# Patient Record
Sex: Female | Born: 1945 | Race: White | Hispanic: No | Smoking: Never smoker
Health system: Southern US, Community
[De-identification: ages and names within clinical notes are randomized; demographics above are authoritative.]

## PROBLEM LIST (undated history)

## (undated) ENCOUNTER — Ambulatory Visit

## (undated) DIAGNOSIS — G709 Myoneural disorder, unspecified: Secondary | ICD-10-CM

## (undated) DIAGNOSIS — I639 Cerebral infarction, unspecified: Secondary | ICD-10-CM

## (undated) DIAGNOSIS — I50810 Right heart failure, unspecified: Secondary | ICD-10-CM

## (undated) DIAGNOSIS — E785 Hyperlipidemia, unspecified: Secondary | ICD-10-CM

## (undated) DIAGNOSIS — Z86718 Personal history of other venous thrombosis and embolism: Secondary | ICD-10-CM

## (undated) DIAGNOSIS — R2689 Other abnormalities of gait and mobility: Secondary | ICD-10-CM

## (undated) DIAGNOSIS — Z8489 Family history of other specified conditions: Secondary | ICD-10-CM

## (undated) DIAGNOSIS — H269 Unspecified cataract: Secondary | ICD-10-CM

## (undated) DIAGNOSIS — G4730 Sleep apnea, unspecified: Secondary | ICD-10-CM

## (undated) DIAGNOSIS — M1990 Unspecified osteoarthritis, unspecified site: Secondary | ICD-10-CM

## (undated) DIAGNOSIS — F419 Anxiety disorder, unspecified: Secondary | ICD-10-CM

## (undated) DIAGNOSIS — J189 Pneumonia, unspecified organism: Secondary | ICD-10-CM

## (undated) DIAGNOSIS — Z87442 Personal history of urinary calculi: Secondary | ICD-10-CM

## (undated) DIAGNOSIS — I509 Heart failure, unspecified: Secondary | ICD-10-CM

## (undated) DIAGNOSIS — F329 Major depressive disorder, single episode, unspecified: Secondary | ICD-10-CM

## (undated) DIAGNOSIS — G629 Polyneuropathy, unspecified: Secondary | ICD-10-CM

## (undated) DIAGNOSIS — R609 Edema, unspecified: Secondary | ICD-10-CM

## (undated) DIAGNOSIS — N393 Stress incontinence (female) (male): Secondary | ICD-10-CM

## (undated) DIAGNOSIS — G8929 Other chronic pain: Secondary | ICD-10-CM

## (undated) DIAGNOSIS — K219 Gastro-esophageal reflux disease without esophagitis: Secondary | ICD-10-CM

## (undated) DIAGNOSIS — F32A Depression, unspecified: Secondary | ICD-10-CM

## (undated) DIAGNOSIS — T7840XA Allergy, unspecified, initial encounter: Secondary | ICD-10-CM

## (undated) DIAGNOSIS — R51 Headache: Secondary | ICD-10-CM

## (undated) DIAGNOSIS — E669 Obesity, unspecified: Secondary | ICD-10-CM

## (undated) DIAGNOSIS — R519 Headache, unspecified: Secondary | ICD-10-CM

## (undated) DIAGNOSIS — I10 Essential (primary) hypertension: Secondary | ICD-10-CM

## (undated) HISTORY — DX: Right heart failure, unspecified: I50.810

## (undated) HISTORY — DX: Essential (primary) hypertension: I10

## (undated) HISTORY — DX: Unspecified cataract: H26.9

## (undated) HISTORY — DX: Sleep apnea, unspecified: G47.30

## (undated) HISTORY — DX: Allergy, unspecified, initial encounter: T78.40XA

## (undated) HISTORY — PX: FRACTURE SURGERY: SHX138

## (undated) HISTORY — PX: TUBAL LIGATION: SHX77

## (undated) HISTORY — DX: Unspecified osteoarthritis, unspecified site: M19.90

## (undated) HISTORY — PX: OTHER SURGICAL HISTORY: SHX169

## (undated) HISTORY — DX: Personal history of other venous thrombosis and embolism: Z86.718

## (undated) HISTORY — DX: Other chronic pain: G89.29

## (undated) HISTORY — DX: Headache: R51

## (undated) HISTORY — DX: Headache, unspecified: R51.9

## (undated) HISTORY — DX: Obesity, unspecified: E66.9

## (undated) HISTORY — PX: ABDOMINAL HYSTERECTOMY: SHX81

## (undated) HISTORY — DX: Heart failure, unspecified: I50.9

## (undated) HISTORY — DX: Depression, unspecified: F32.A

## (undated) HISTORY — DX: Polyneuropathy, unspecified: G62.9

## (undated) HISTORY — DX: Myoneural disorder, unspecified: G70.9

## (undated) HISTORY — DX: Anxiety disorder, unspecified: F41.9

## (undated) HISTORY — DX: Hyperlipidemia, unspecified: E78.5

## (undated) HISTORY — DX: Other abnormalities of gait and mobility: R26.89

## (undated) HISTORY — DX: Gastro-esophageal reflux disease without esophagitis: K21.9

## (undated) HISTORY — DX: Major depressive disorder, single episode, unspecified: F32.9

---

## 2007-09-26 DIAGNOSIS — I2692 Saddle embolus of pulmonary artery without acute cor pulmonale: Secondary | ICD-10-CM

## 2007-09-26 HISTORY — DX: Saddle embolus of pulmonary artery without acute cor pulmonale: I26.92

## 2007-11-15 ENCOUNTER — Ambulatory Visit: Payer: Self-pay | Admitting: Family Medicine

## 2007-11-15 ENCOUNTER — Encounter: Admission: RE | Admit: 2007-11-15 | Discharge: 2007-11-15 | Payer: Self-pay | Admitting: Family Medicine

## 2007-11-20 ENCOUNTER — Ambulatory Visit: Payer: Self-pay | Admitting: Family Medicine

## 2007-12-25 ENCOUNTER — Ambulatory Visit: Payer: Self-pay | Admitting: *Deleted

## 2007-12-25 ENCOUNTER — Encounter (INDEPENDENT_AMBULATORY_CARE_PROVIDER_SITE_OTHER): Payer: Self-pay | Admitting: Internal Medicine

## 2007-12-25 ENCOUNTER — Ambulatory Visit: Payer: Self-pay | Admitting: Cardiology

## 2007-12-25 ENCOUNTER — Inpatient Hospital Stay (HOSPITAL_COMMUNITY): Admission: EM | Admit: 2007-12-25 | Discharge: 2007-12-30 | Payer: Self-pay | Admitting: Emergency Medicine

## 2007-12-26 ENCOUNTER — Encounter: Payer: Self-pay | Admitting: Vascular Surgery

## 2007-12-26 ENCOUNTER — Ambulatory Visit: Payer: Self-pay | Admitting: Vascular Surgery

## 2008-01-09 ENCOUNTER — Encounter (INDEPENDENT_AMBULATORY_CARE_PROVIDER_SITE_OTHER): Payer: Self-pay | Admitting: *Deleted

## 2008-01-09 ENCOUNTER — Inpatient Hospital Stay (HOSPITAL_COMMUNITY): Admission: EM | Admit: 2008-01-09 | Discharge: 2008-01-10 | Payer: Self-pay | Admitting: Emergency Medicine

## 2008-06-09 IMAGING — CR DG CHEST 2V
2 series · 2 of 2 positions shown · non-contrast
Comparison: None.

CLINICAL DATA: Shortness of breath.  
 CHEST ? 2 VIEW:

[view not recorded (1 of 2)]
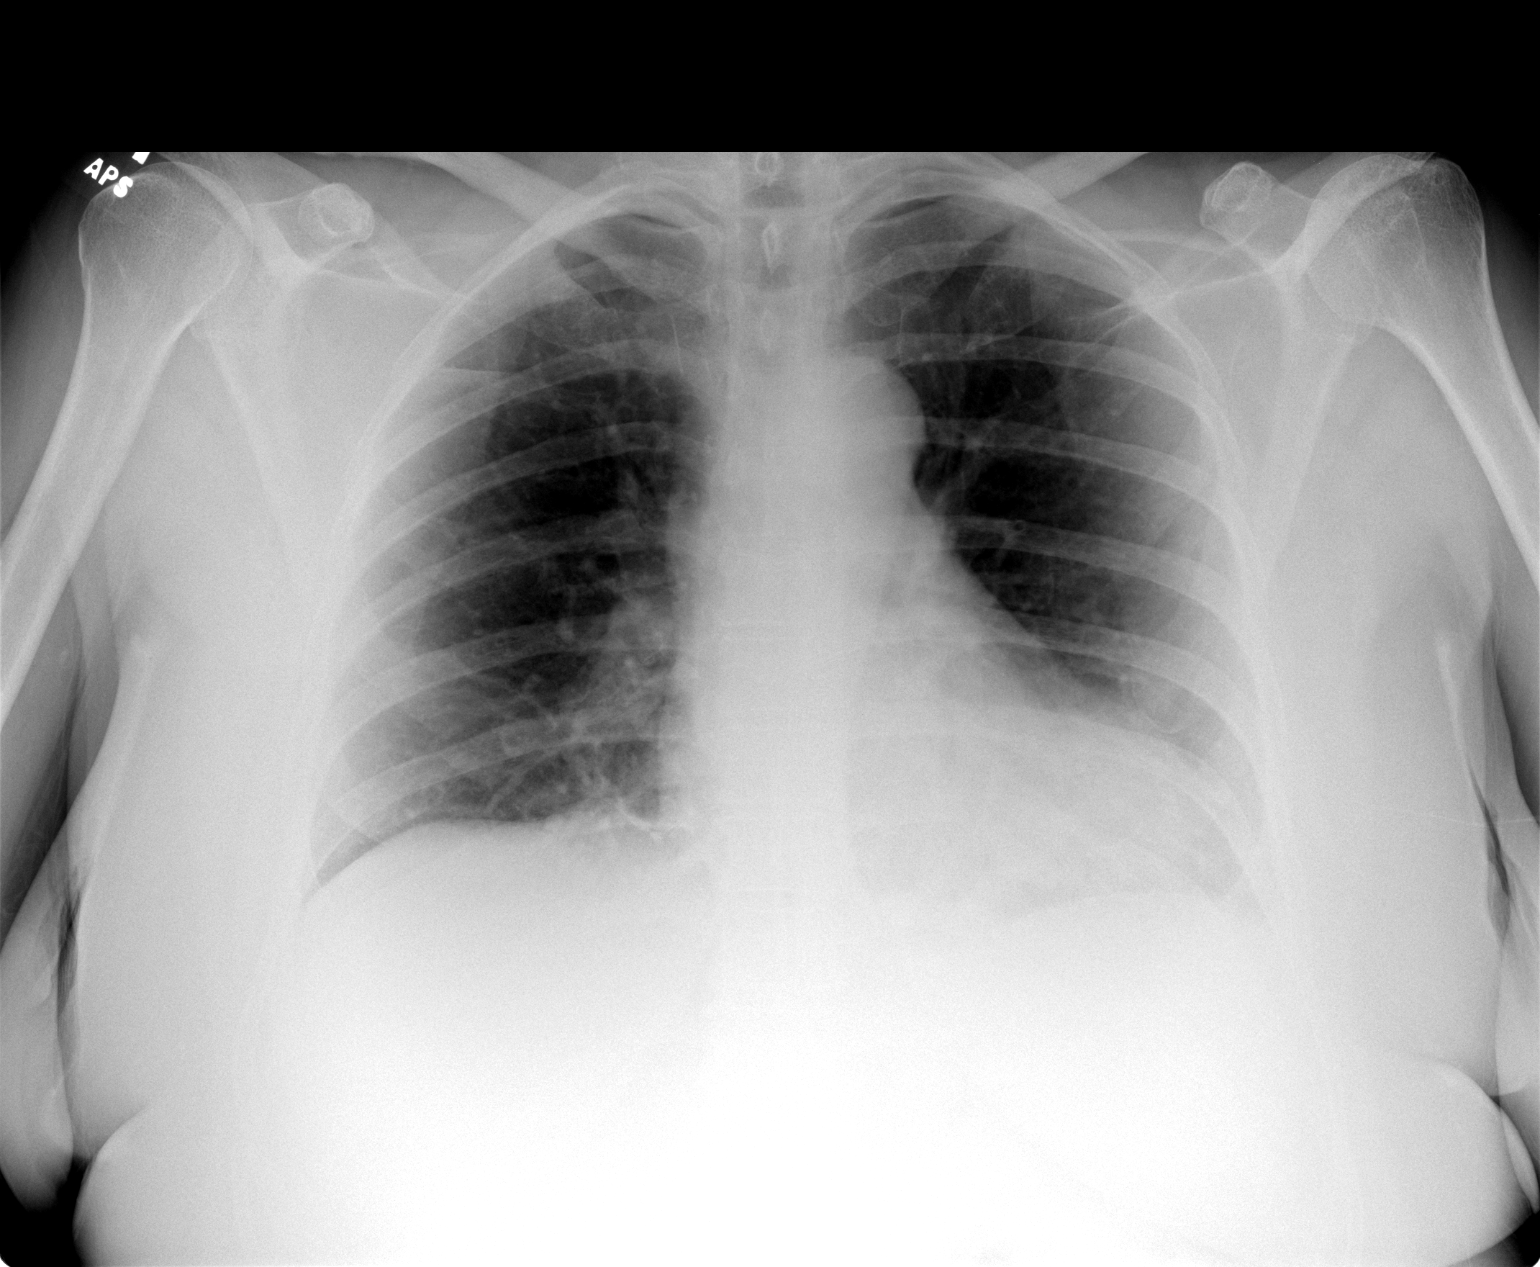

[view not recorded (2 of 2)]
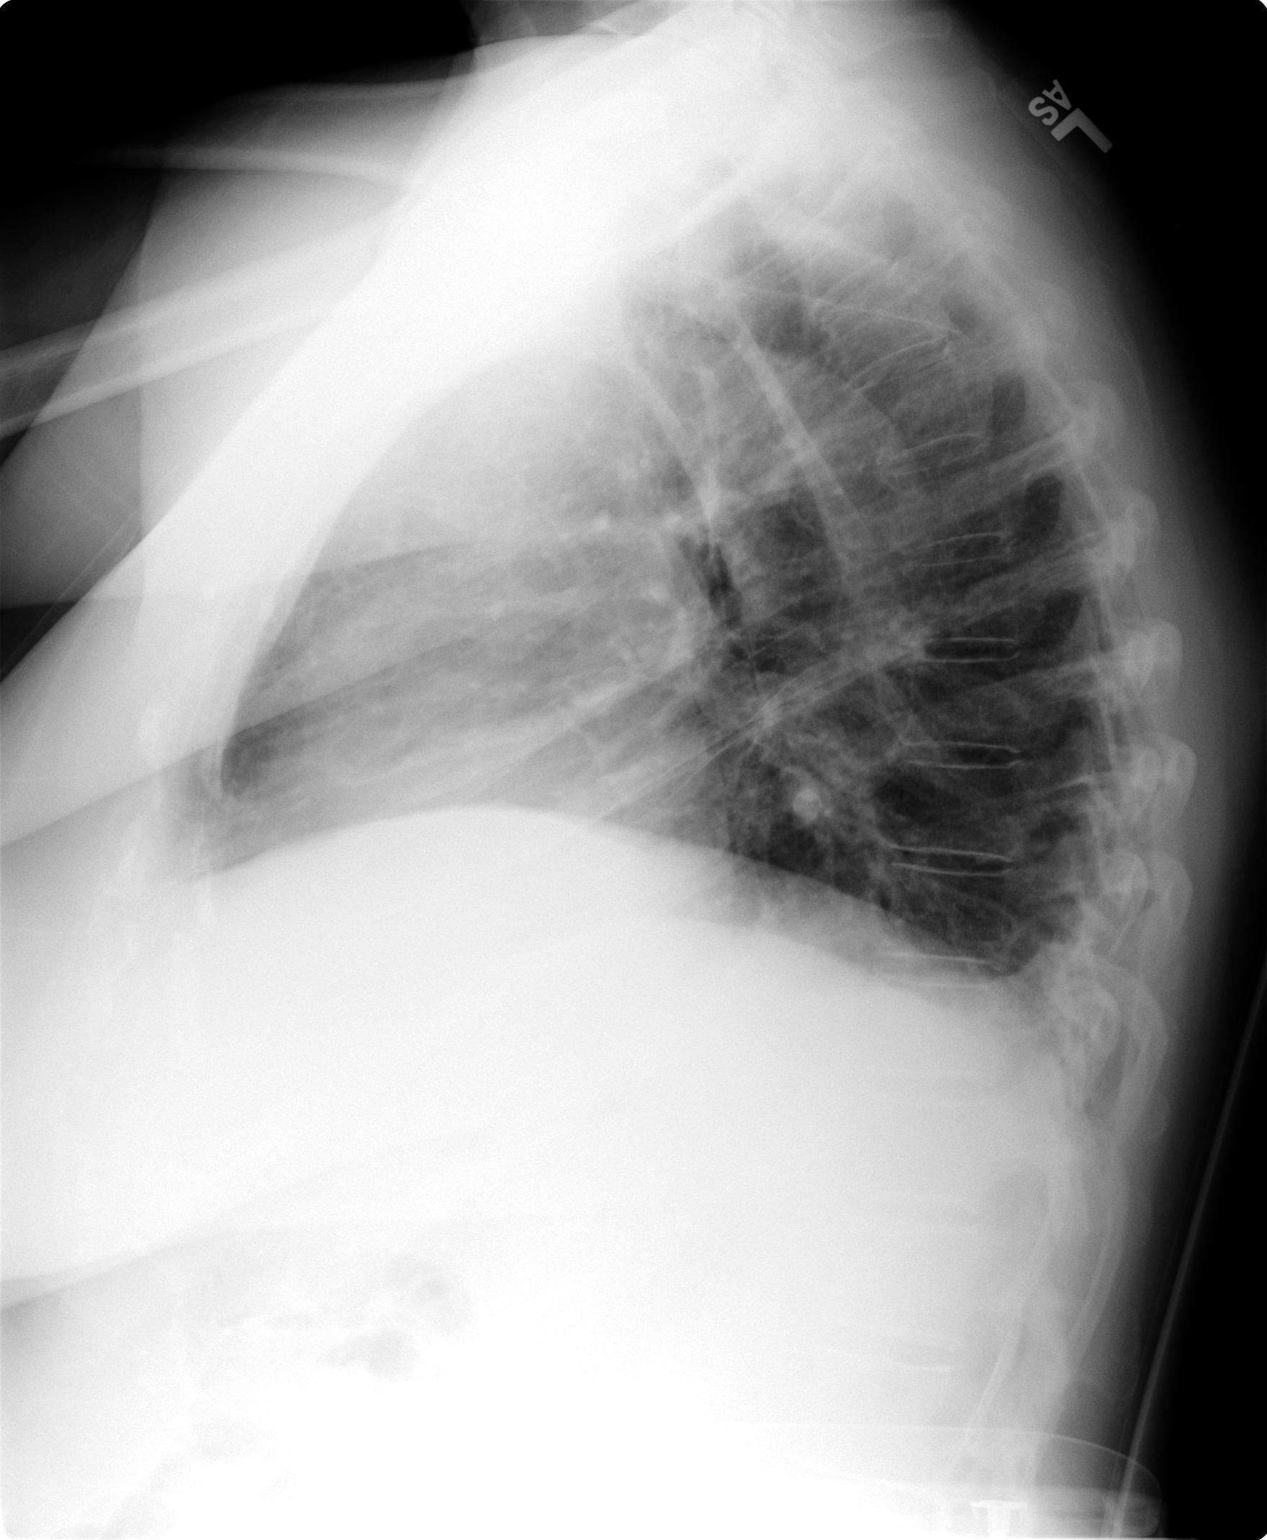

[2 of 2 positions shown; findings below may reference images not displayed]

FINDINGS: Patient has a small left pleural effusion with a tiny left apical pneumothorax.  Right lung is clear.  Heart size normal.
IMPRESSION: Small left pleural effusion with tiny left apical pneumothorax.

## 2008-07-19 IMAGING — CT CT ANGIO CHEST
2 of 5 series · 19 of 36 positions shown · IV contrast (agent unspecified)
Comparison: Chest x-ray 11/15/2007.

CLINICAL DATA: Shortness of breath with upper back pain radiating
to right shoulder.  Surgery October 2007.

CT ANGIOGRAPHY CHEST
TECHNIQUE: Multidetector CT imaging of the chest using the
standard protocol during bolus administration of intravenous
contrast. Multiplanar reconstructed images obtained and reviewed to
evaluate the vascular anatomy.
Contrast: 70 ml of Hmnipaque-KRR.

[Series 8: pulm embolism 1.0 b25f thins · axial · 0.65mm/px · z∈[-254,-16]mm · 16 of 267 slices shown]
[im 14/267  lung]
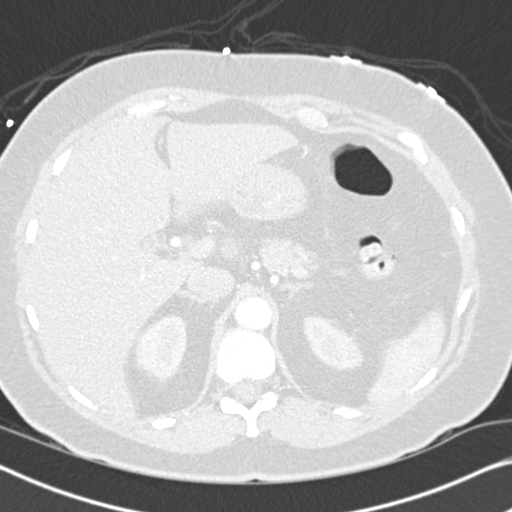
[im 27/267  mediastinal]
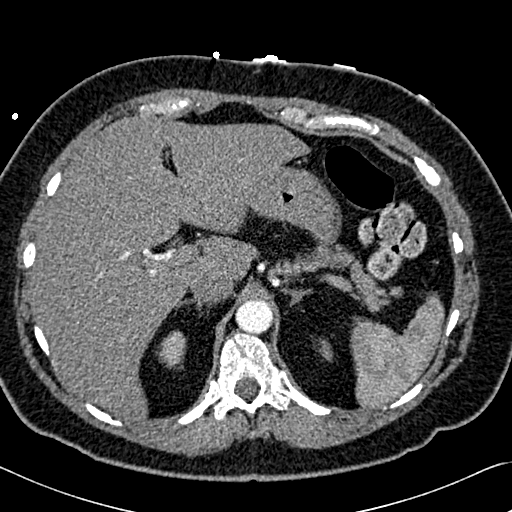
[im 40/267  lung]
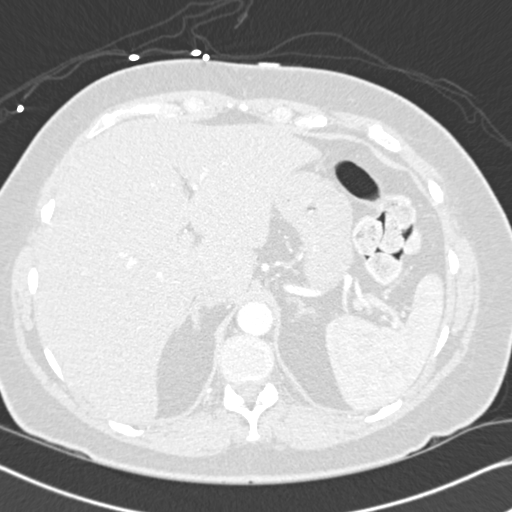
[im 67/267  mediastinal]
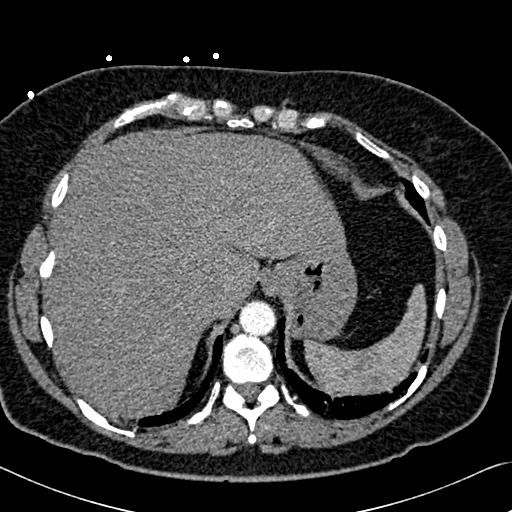
[im 80/267  lung]
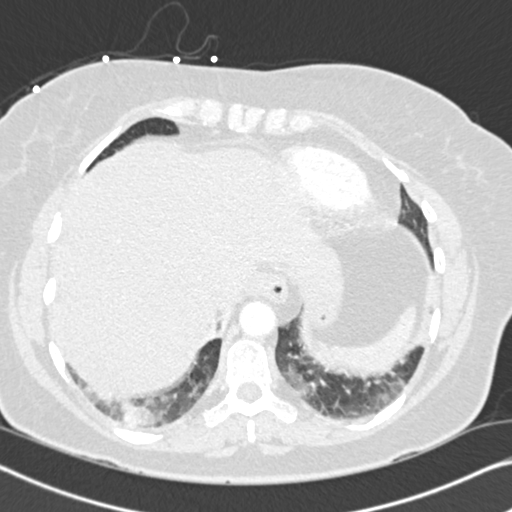
[im 94/267  mediastinal]
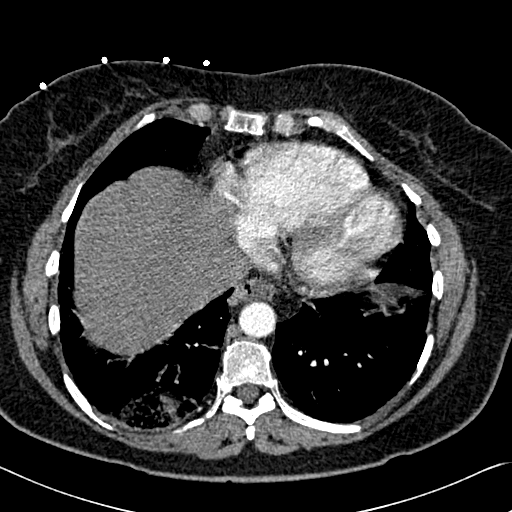
[im 107/267  lung]
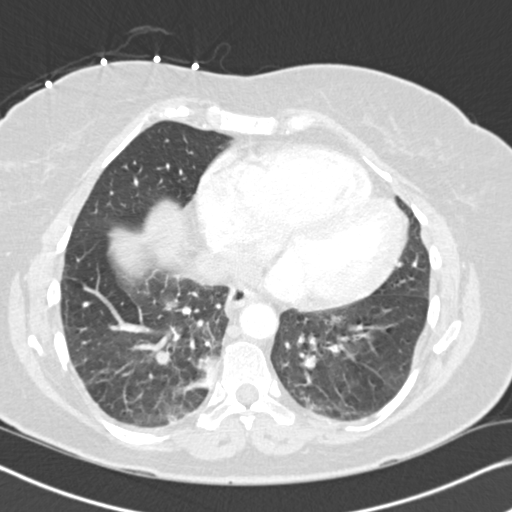
[im 120/267  mediastinal]
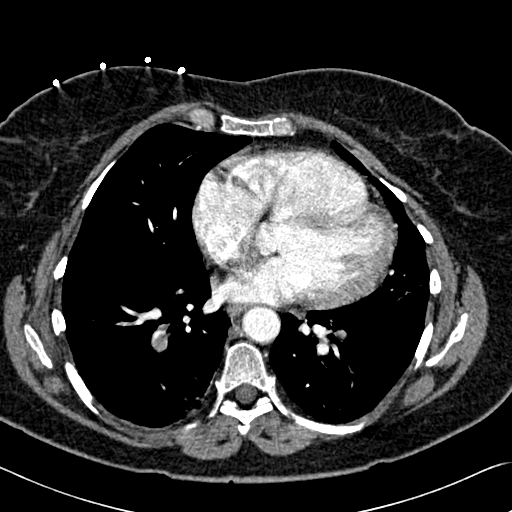
[im 147/267  lung]
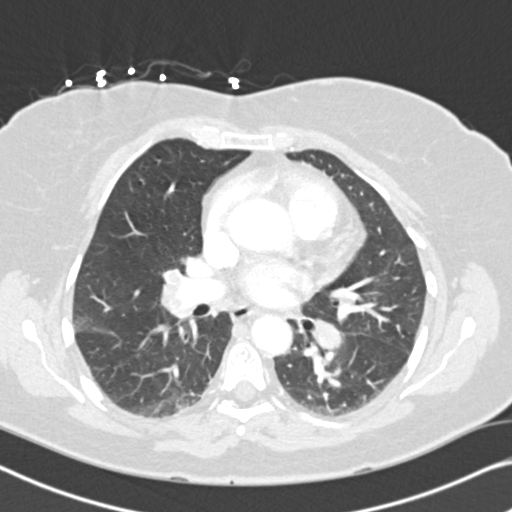
[im 160/267  mediastinal]
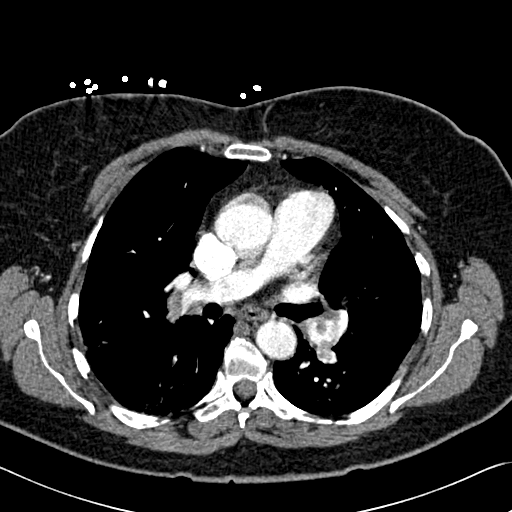
[im 173/267  lung]
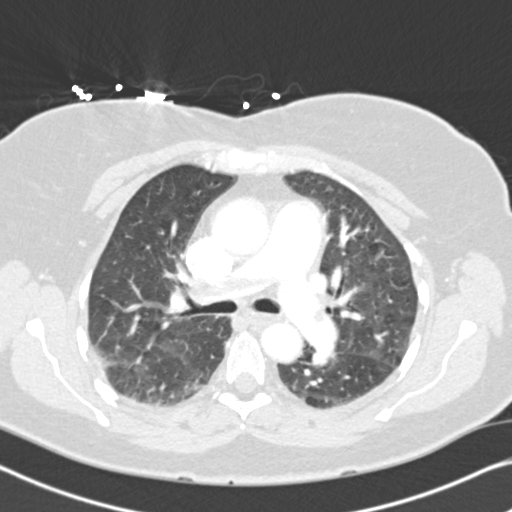
[im 187/267  mediastinal]
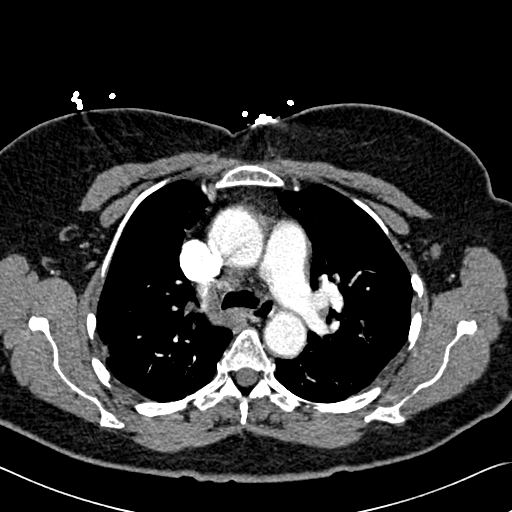
[im 200/267  lung]
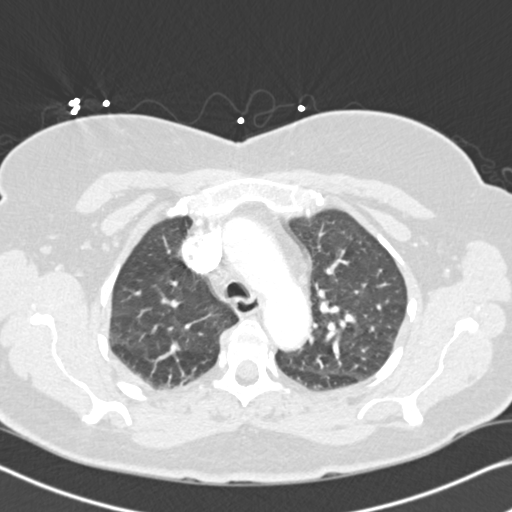
[im 227/267  mediastinal]
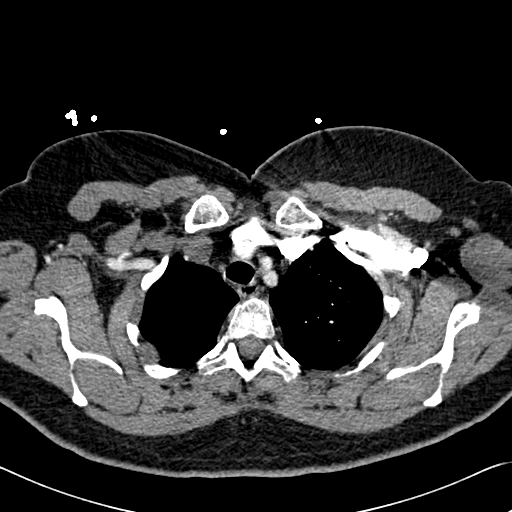
[im 240/267  lung]
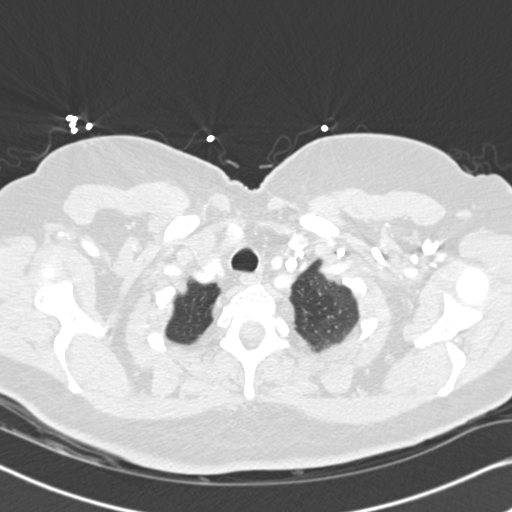
[im 253/267  mediastinal]
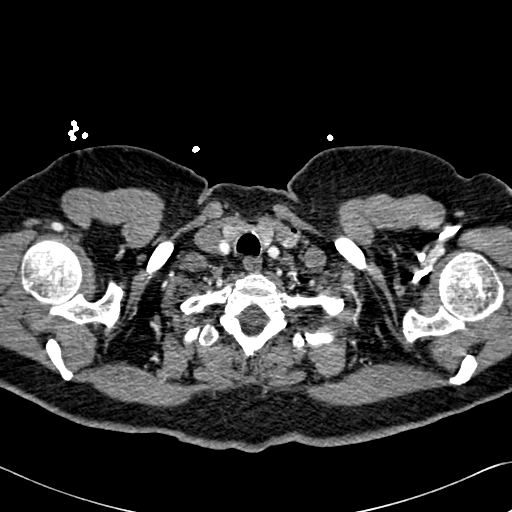

[Series 9: pulm embolism 2.0 spo cor thins · coronal · 0.65mm/px · 3 of 111 slices shown]
[im 23/111  mediastinal]
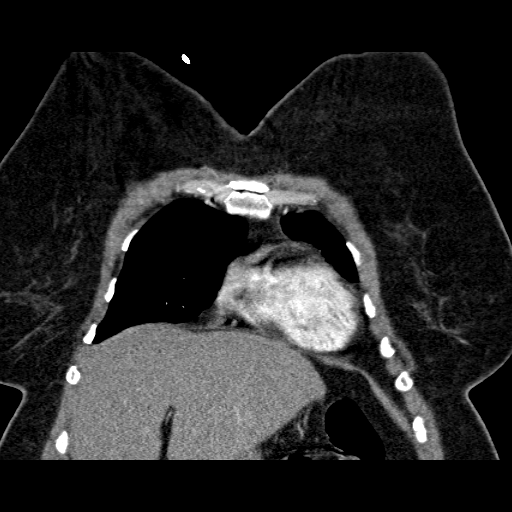
[im 45/111  mediastinal]
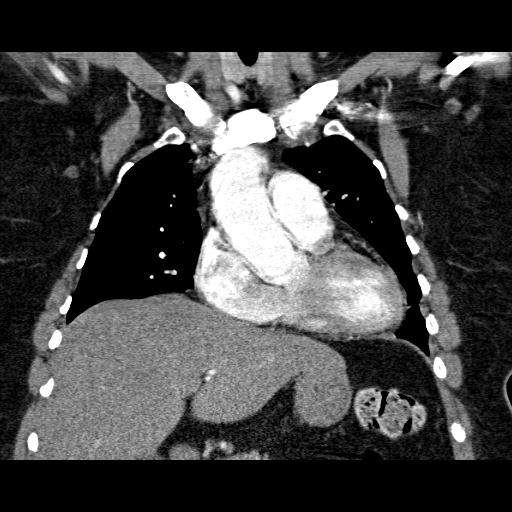
[im 67/111  mediastinal]
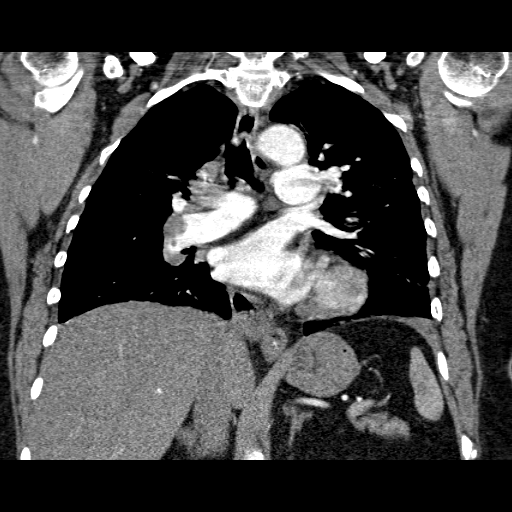

[19 of 36 positions shown; findings below may reference images not displayed]

FINDINGS: There is a large saddle embolus at the bifurcation of the
main pulmonary arteries, with clot extending into the lobar,
segmental and subsegmental pulmonary arteries bilaterally.
Pulmonary arteries are enlarged and there is straightening of the
interventricular septum.  Heart is enlarged.  Mediastinal lymph
nodes are small to borderline enlarged, measuring up to 1.1 cm
anterior to the right mainstem bronchus. Esophagus is minimally
dilated throughout its course and there is a small hiatal hernia.

Subpleural ground-glass is seen in the right lower lobe.  There are
other scattered foci of ground-glass attenuation.  Linear densities
are seen scattered as well, consist with atelectasis.  No pleural
fluid.  Airway unremarkable.  Atelectasis or scar along the left
hemidiaphragm.

Incidental imaging of the upper abdomen shows no acute findings.
No worrisome lytic or sclerotic lesions.
IMPRESSION: 1.  Large saddle pulmonary embolus with extension into the distal
pulmonary arteries bilaterally, as above.  Right heart strain and
pulmonary arterial hypertension.  Right lower lobe infarct.
Probable scattered areas of early infarction  elsewhere. This was
discussed with Dr. Rudi, in [REDACTED], at [DATE] a.m. on 12/25/2007.

## 2008-07-20 IMAGING — CR DG CHEST 1V PORT
1 series · 1 of 1 positions shown · non-contrast
Comparison: Chest x-ray of 11/15/2007 and CT chest of 12/25/2007.

CLINICAL DATA: History of pulmonary embolism and hypertension,
shortness of breath

PORTABLE CHEST - 1 VIEW

[AP]
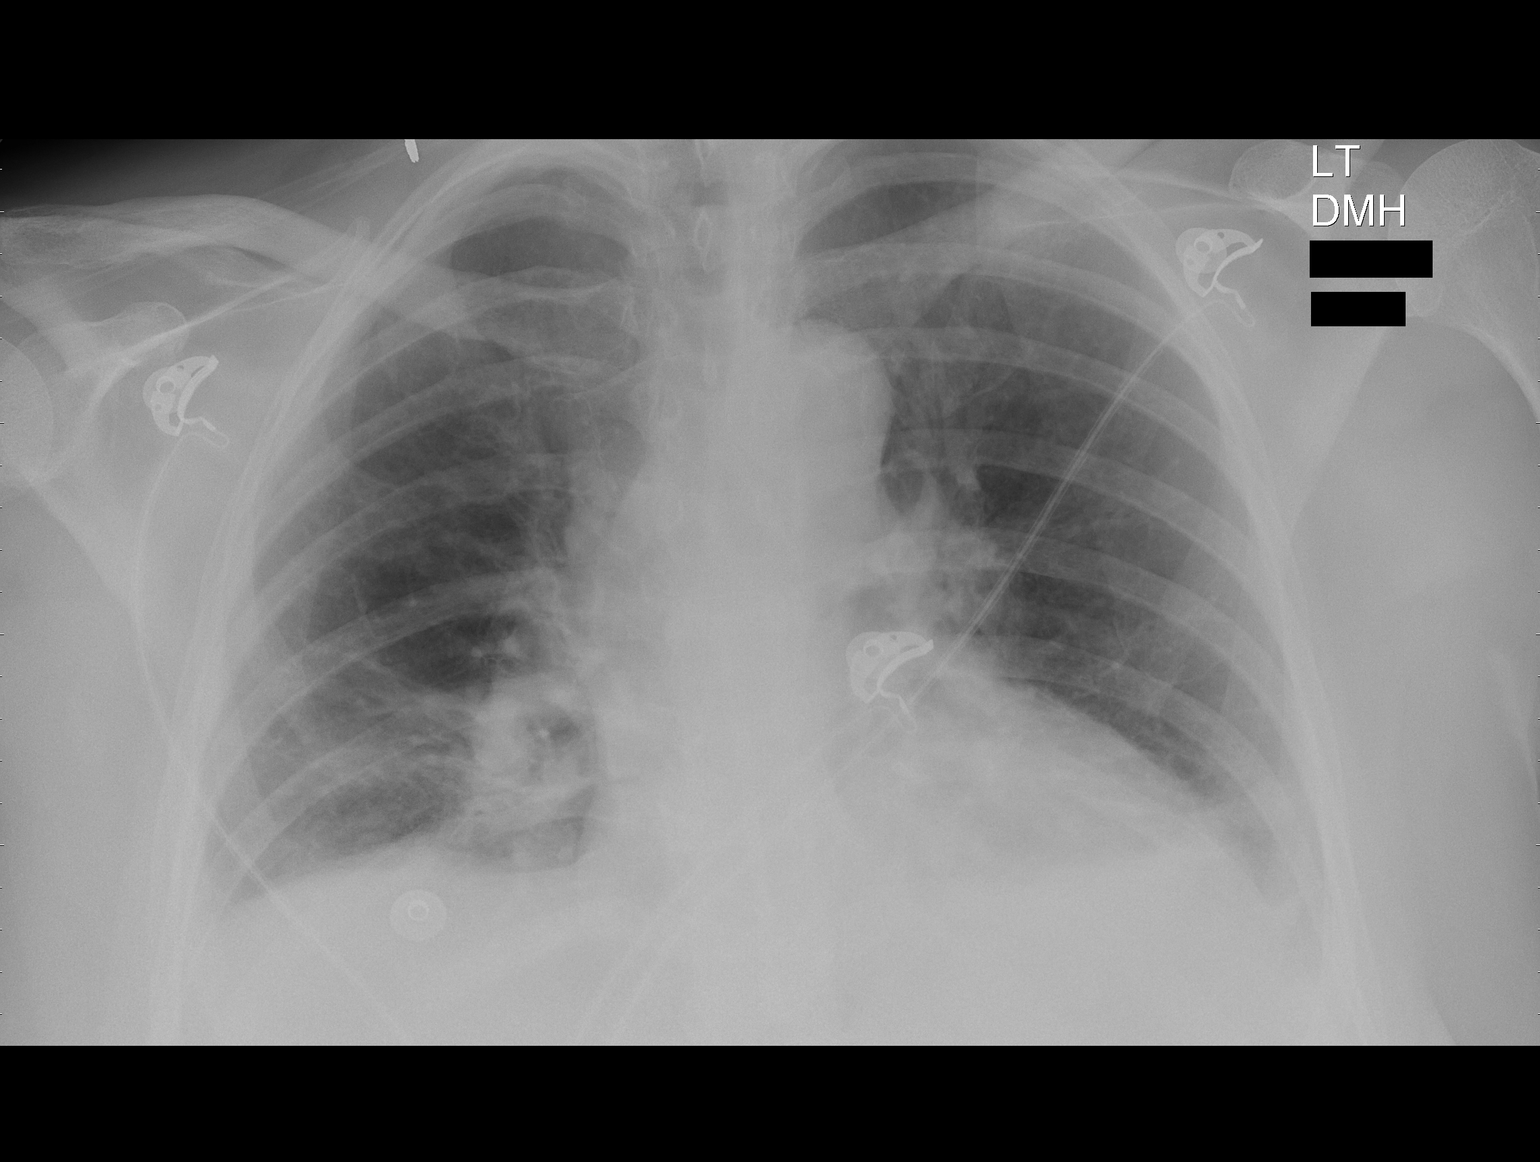

[1 of 1 positions shown; findings below may reference images not displayed]

FINDINGS: There are linear opacities at both lung bases consistent
with atelectasis.  In view of the large emboli noted on yesterday's
CT of the chest, early infarction cannot be excluded.  Mild
cardiomegaly is present.
IMPRESSION: Linear basilar opacities bilaterally most consistent with
atelectasis.  Cannot exclude early infarction in view of the large
emboli noted on yesterday's CT of the chest.

## 2008-07-22 IMAGING — CR DG FEMUR 2+V*R*
4 series · 4 of 4 positions shown · non-contrast
Comparison: None.

CLINICAL DATA: Status post fracture fixation.  Unable to bear
weight.

RIGHT FEMUR - 2 VIEW

[t femur with knee ap right]
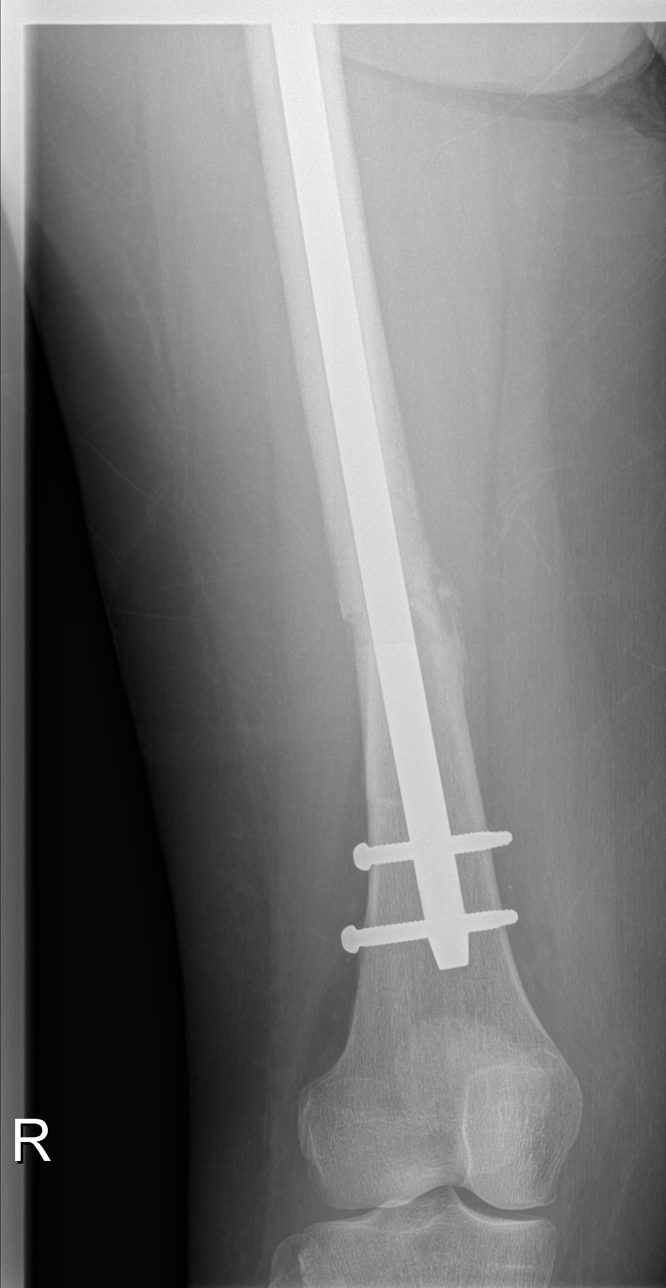

[t femur with hip  ap right]
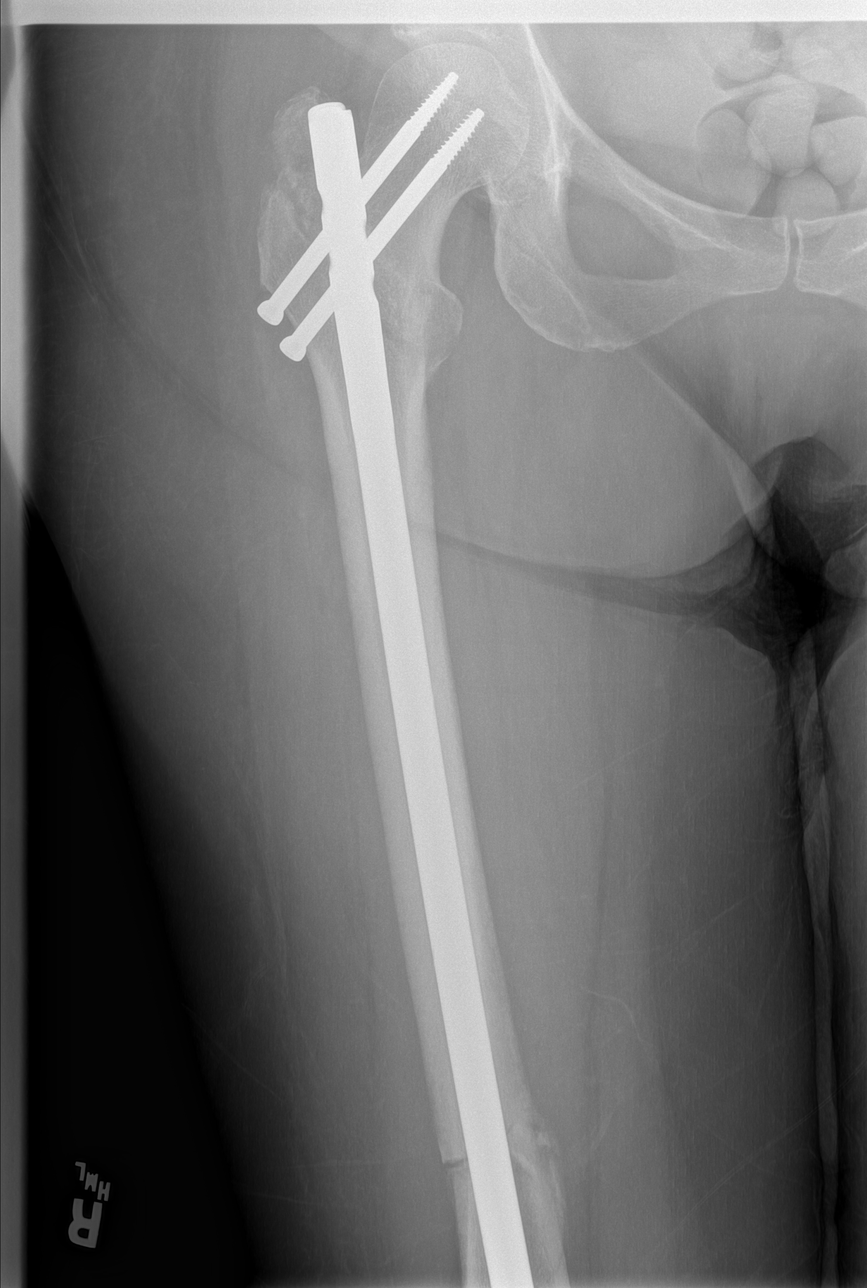

[t femur with hip lat right]
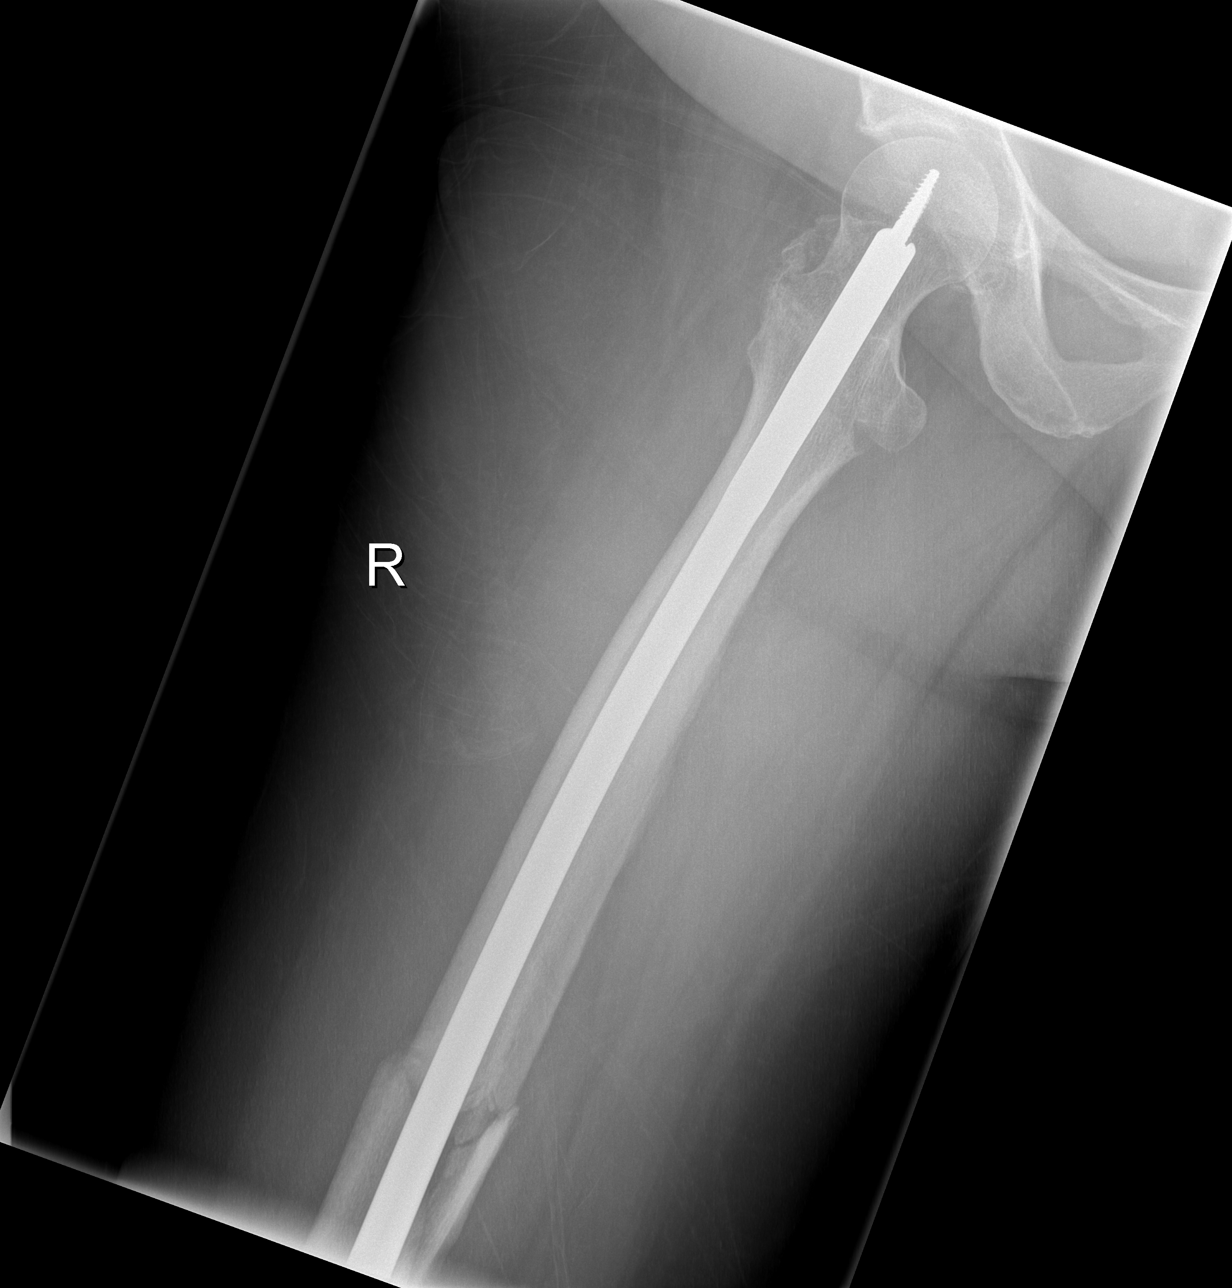

[t femur with knee lat right]
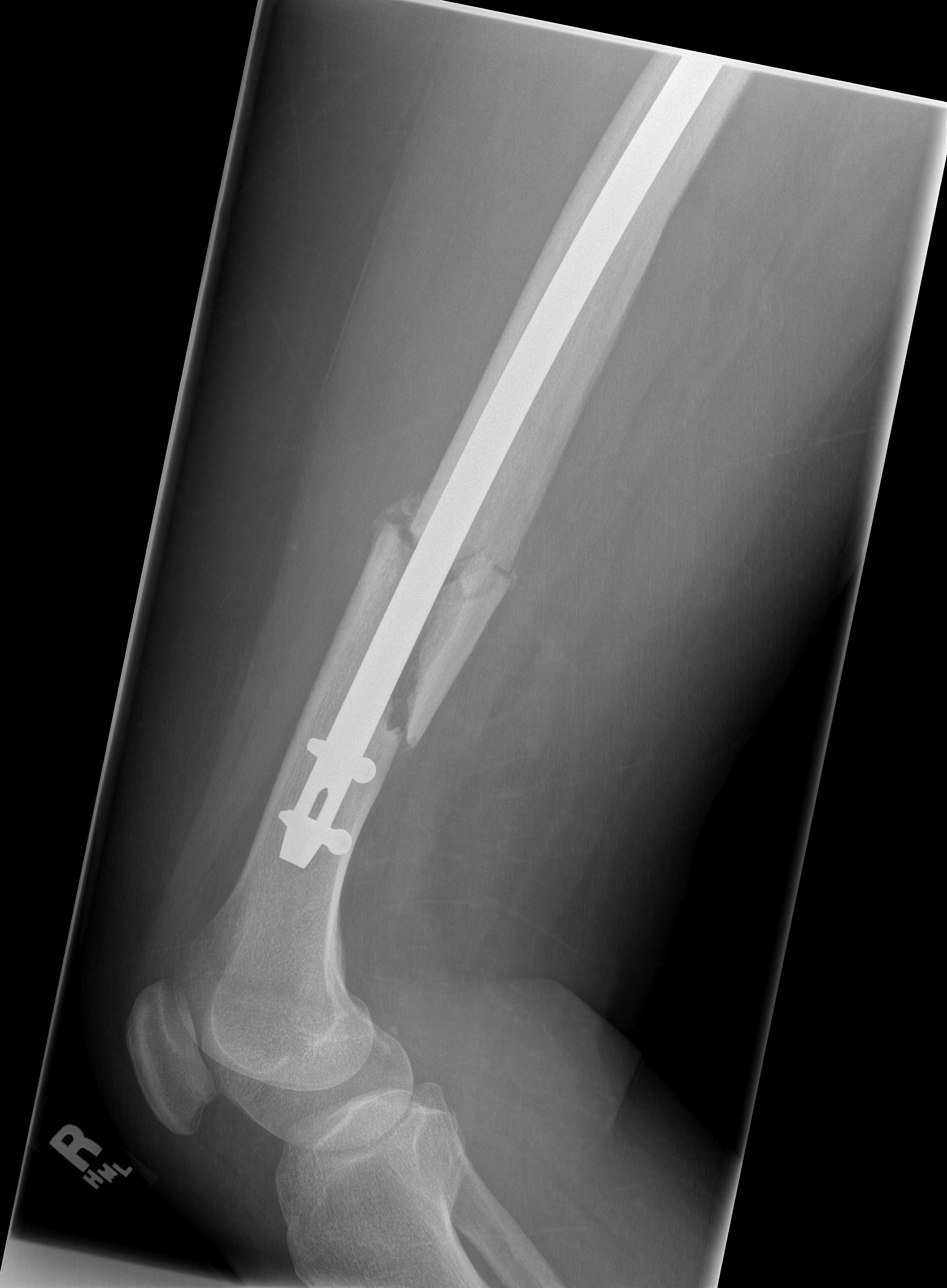

[4 of 4 positions shown; findings below may reference images not displayed]

FINDINGS: An IM nail with two screws proximally and two screws
distally is seen fixing an intertrochanteric fracture of the right
hip and a transverse fracture through the junction of the middle
and distal thirds of the right femur.  Both fractures appear to be
healing.  No hardware complications are identified. Position and
alignment are near anatomical. No acute bony abnormality is seen.
IMPRESSION: Healing fractures of the right femur.  No hardware complication or
acute finding.

## 2008-07-22 IMAGING — CR DG HIP COMPLETE 2+V*R*
3 series · 3 of 3 positions shown · non-contrast
Comparison: None.

CLINICAL DATA: Status post fracture fixation.  Unable to bear
weight.

RIGHT HIP - COMPLETE 2+ VIEW

[t pelvis a.p.]
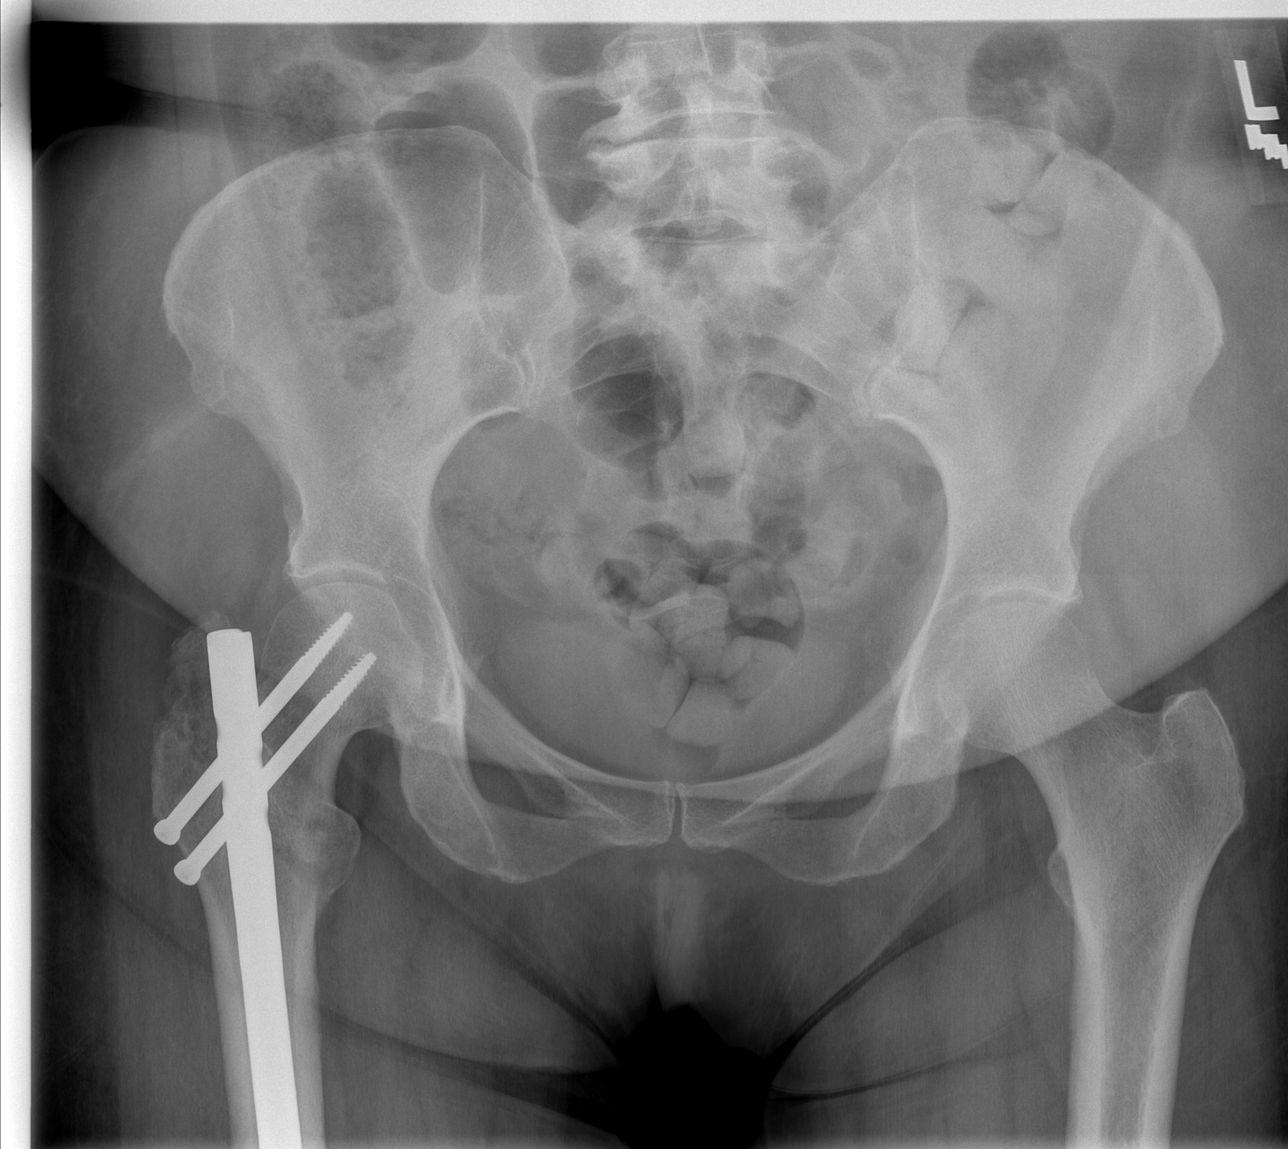

[t hip ap right]
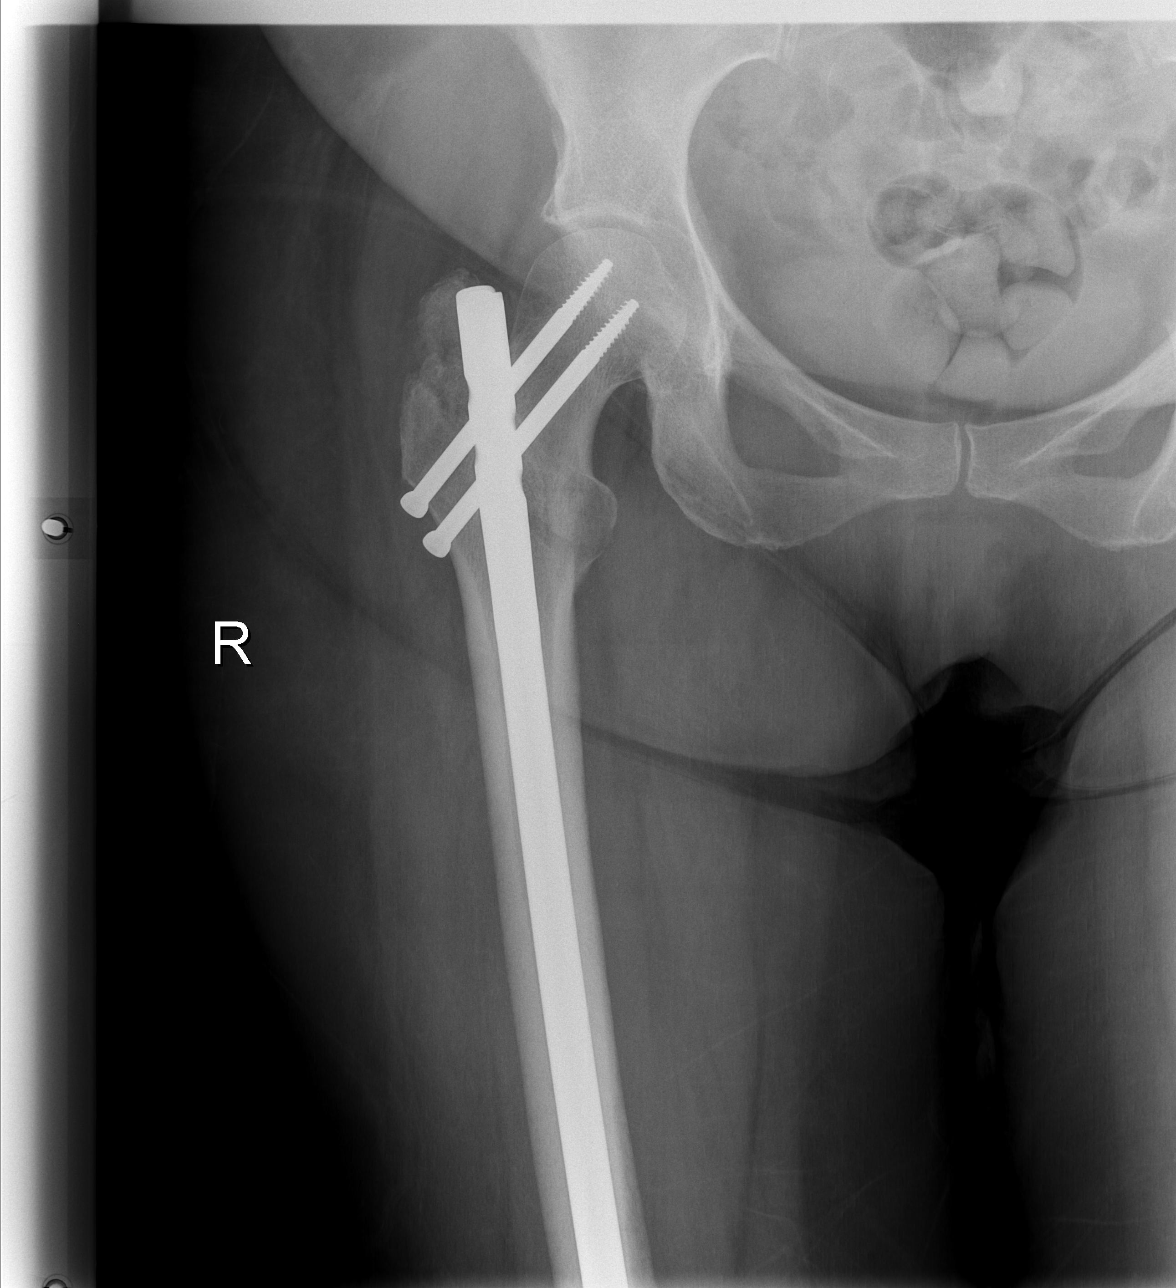

[t hip frog leg right]
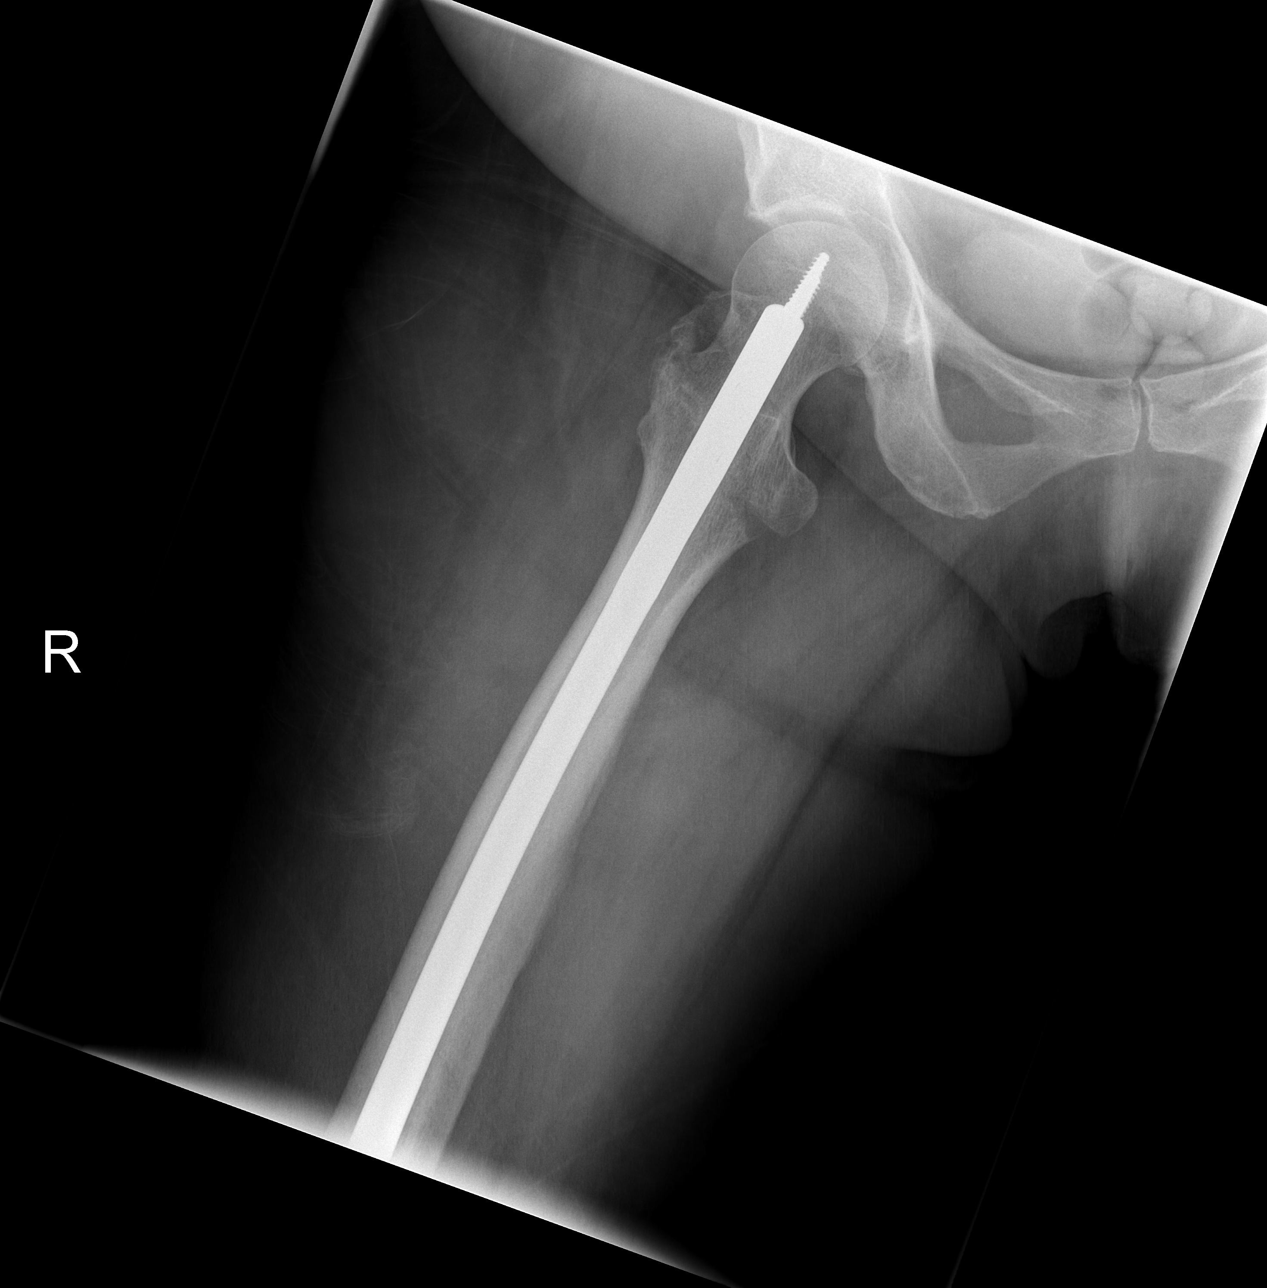

[3 of 3 positions shown; findings below may reference images not displayed]

FINDINGS: Two screws and a long IM nail are seen fixing an
intertrochanteric fracture of the right hip.  Fracture appears be
healing.  No hardware complication is identified.  No evidence of
avascular necrosis is seen.  There is some degenerative disease
lumbar spine.
IMPRESSION: Healing right intertrochanteric fracture with fixation device in
place.  No complicating features.

## 2008-08-02 IMAGING — CR DG CHEST 2V
1 series · 1 of 1 positions shown · non-contrast
Comparison: 12/26/2007 x-ray and CTA chest 12/25/2007

CLINICAL DATA: CHEST - 2 VIEW

[view not recorded]
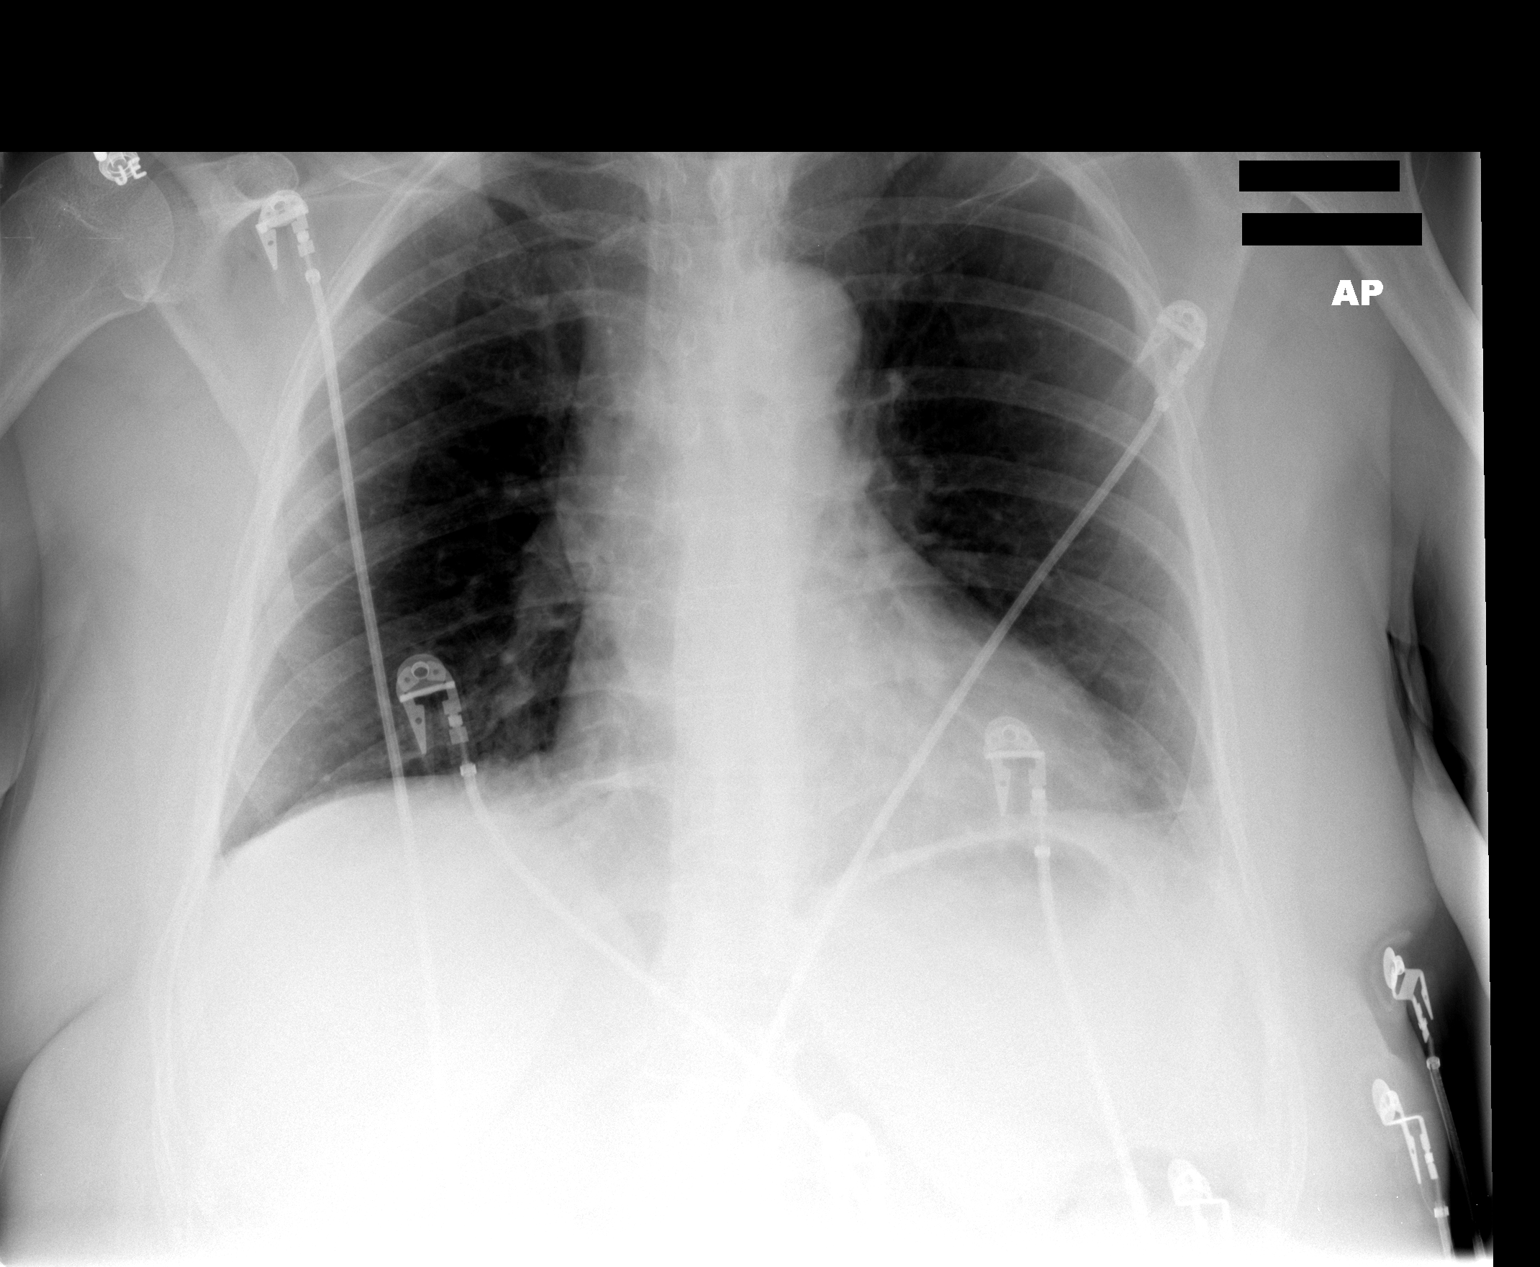

[1 of 1 positions shown; findings below may reference images not displayed]

FINDINGS: Heart and mediastinal contours are stable.  There is
subsegmental bibasilar atelectasis, that is less  prominent on
today's exam compared to the 12/26/2007 examination.  There are no
pleural effusions or focal airspace opacities.
IMPRESSION: Minimal bibasilar atelectasis.

## 2008-08-03 IMAGING — CT CT ANGIO CHEST
3 of 4 series · 18 of 30 positions shown · IV contrast (agent unspecified)
Comparison: 12/25/2007.

CLINICAL DATA: 61-year-old female with history of recent saddle
pulmonary embolus, subtherapeutic on anticoagulation.  New onset
right chest pain and shortness of breath.

CT ANGIOGRAPHY CHEST
TECHNIQUE: Multidetector CT imaging of the chest using the
standard protocol during bolus administration of intravenous
contrast. Multiplanar reconstructed images obtained and reviewed to
evaluate the vascular anatomy.
Contrast: 100 ml 5mnipaque-3CC.

[Series 2: pe · axial · 0.70mm/px · z∈[-286,-58]mm · 9 of 236 slices shown]
[im 27/236  lung]
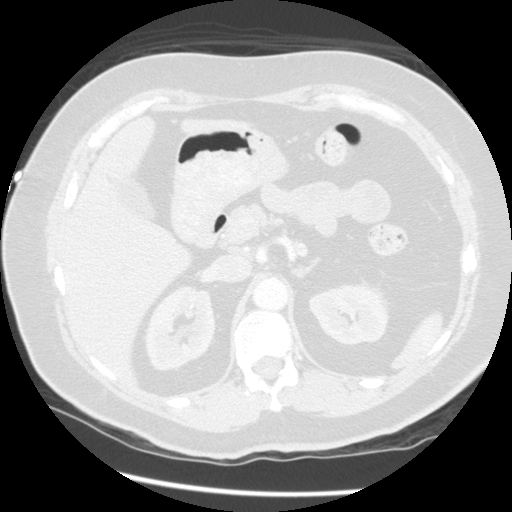
[im 53/236  mediastinal]
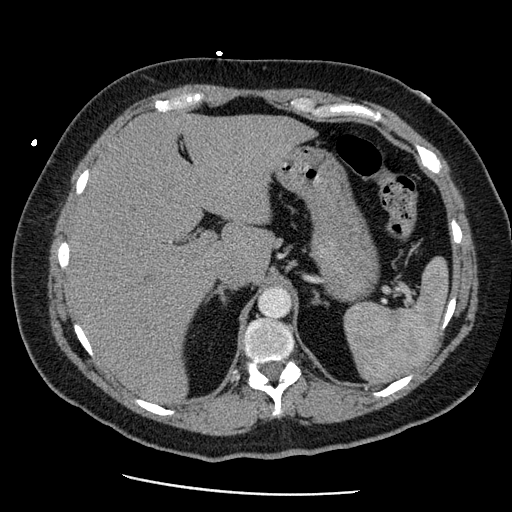
[im 79/236  lung]
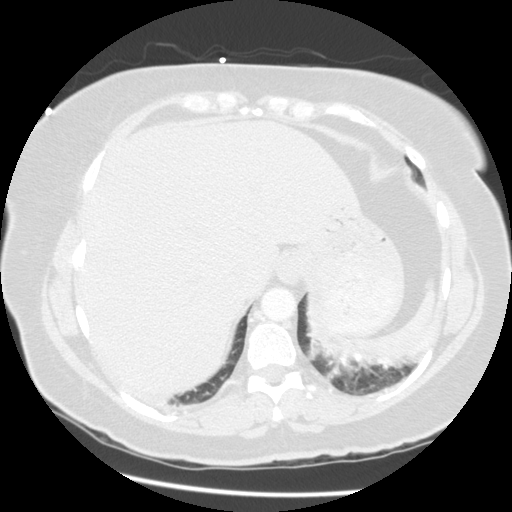
[im 105/236  mediastinal]
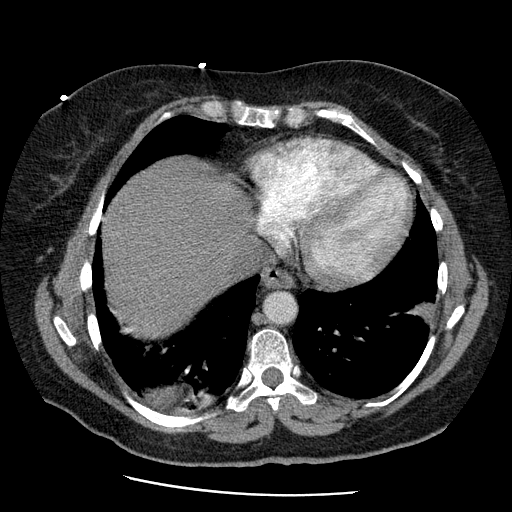
[im 124/236  lung]
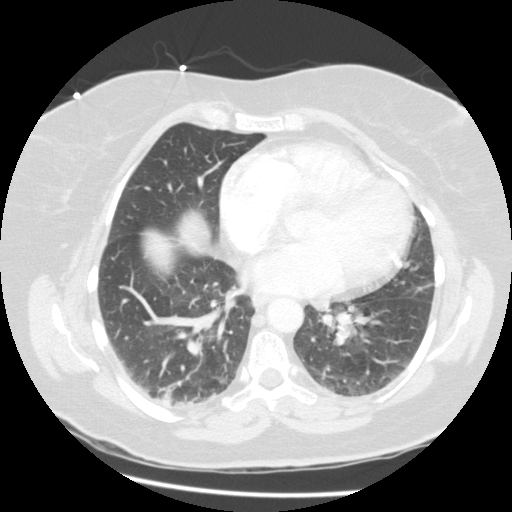
[im 131/236  mediastinal]
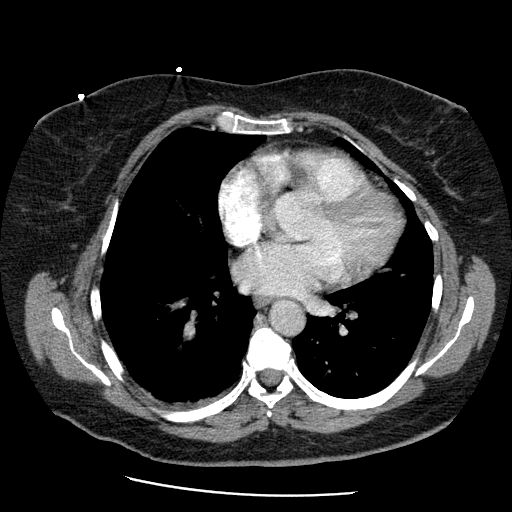
[im 157/236  lung]
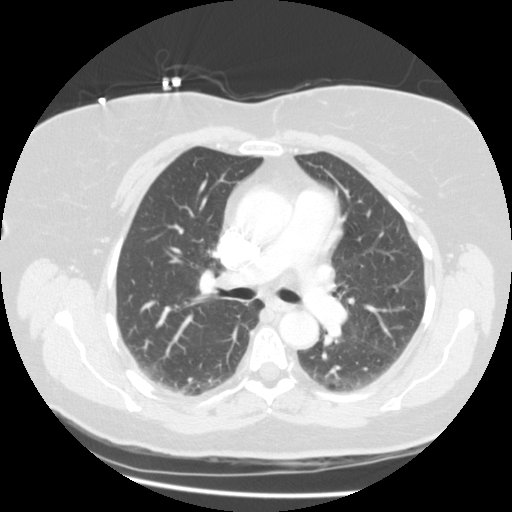
[im 183/236  mediastinal]
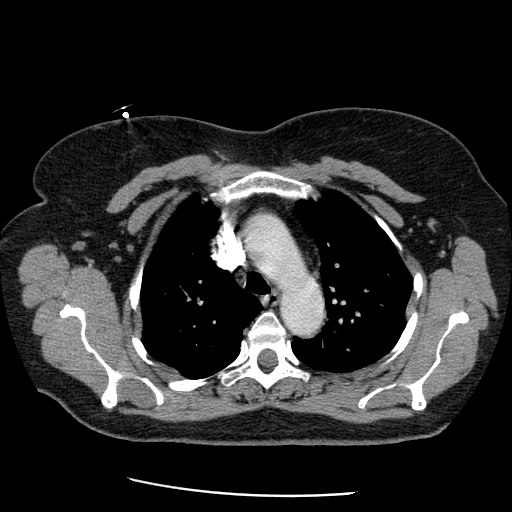
[im 209/236  lung]
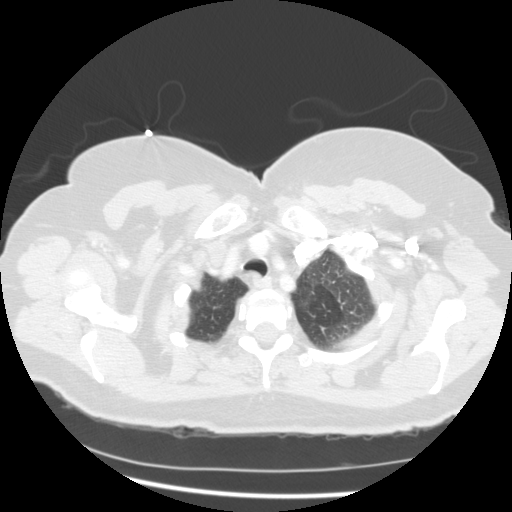

[Series 3: recon 2: pe · axial · 0.70mm/px · z∈[-244,-100]mm · 4 of 118 slices shown]
[im 30/118  lung]
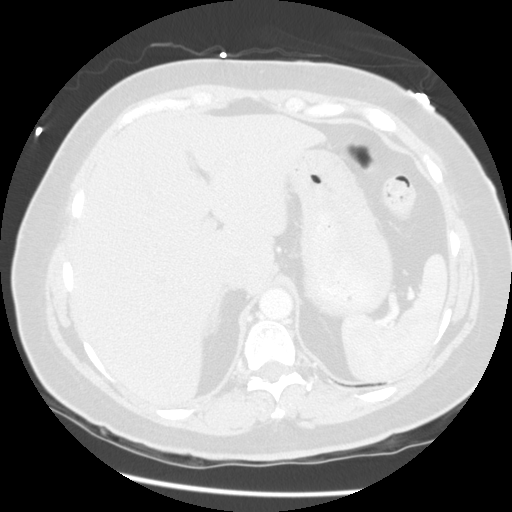
[im 59/118  lung]
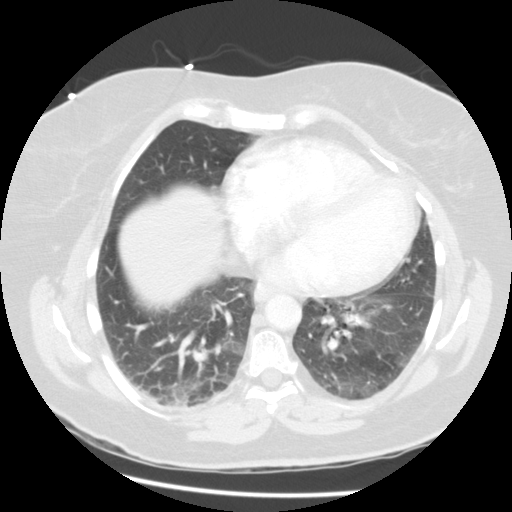
[im 62/118  lung]
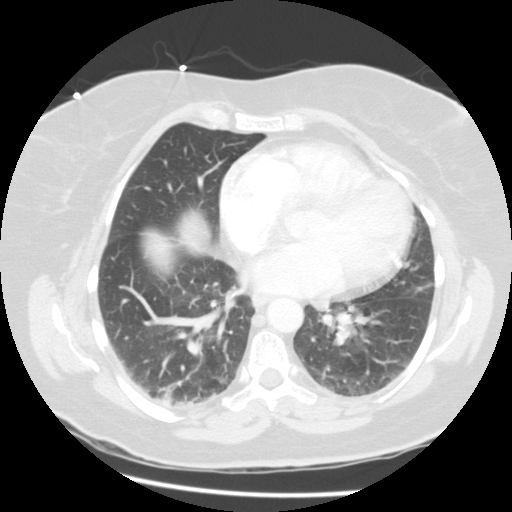
[im 88/118  lung]
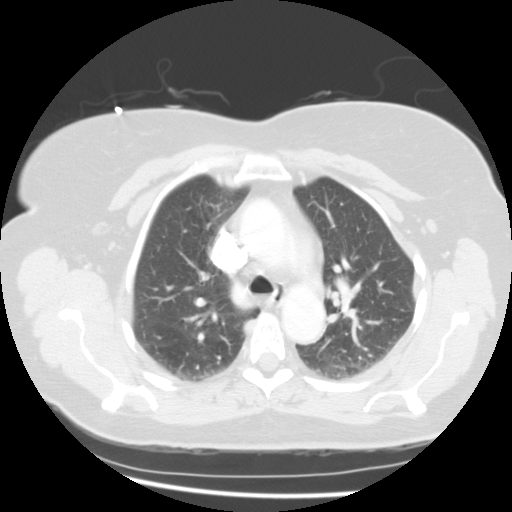

[Series 201: reformatted · sagittal · 0.70mm/px · 5 of 170 slices shown]
[im 29/170  lung]
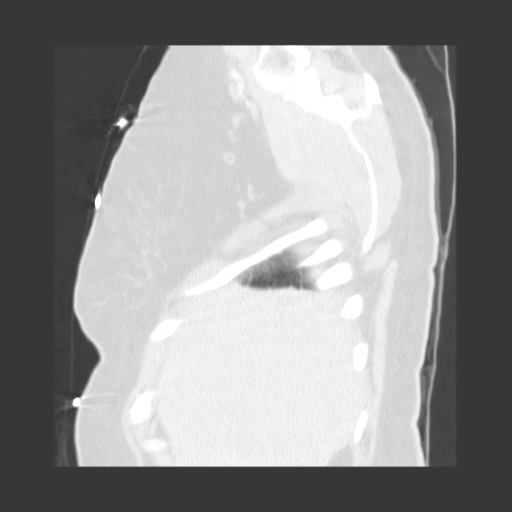
[im 57/170  lung]
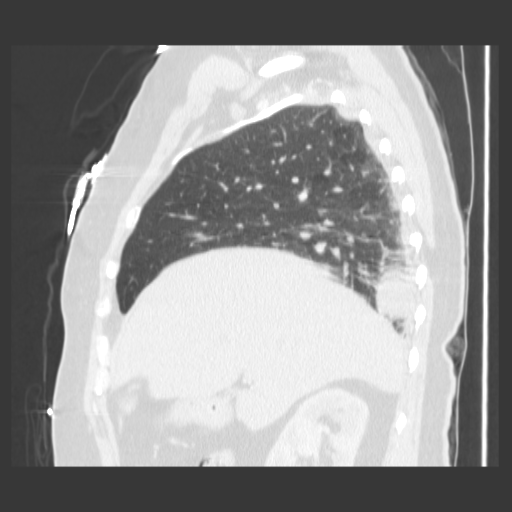
[im 85/170  lung]
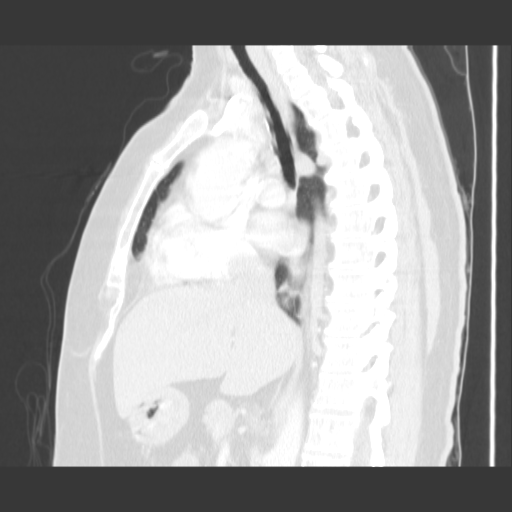
[im 113/170  lung]
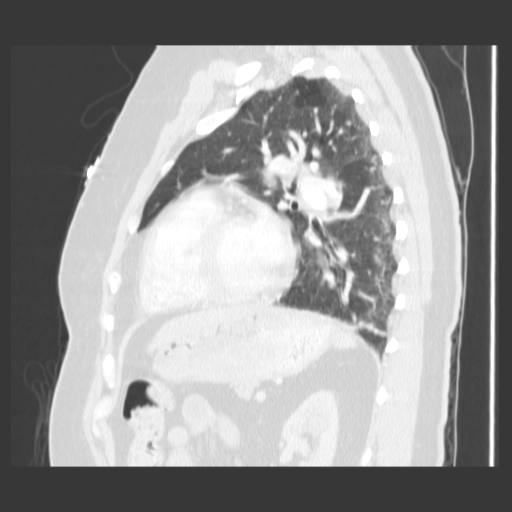
[im 141/170  lung]
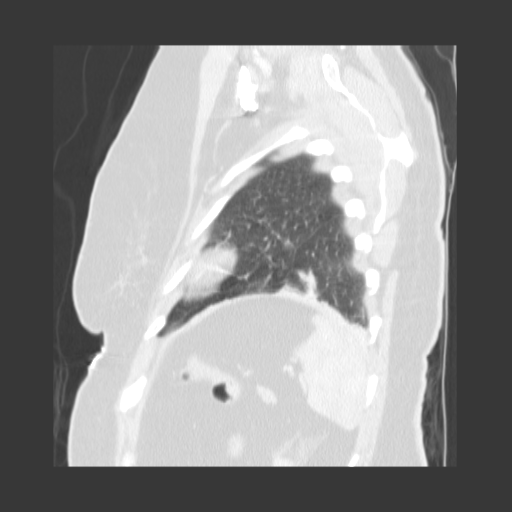

[18 of 30 positions shown; findings below may reference images not displayed]

FINDINGS: Suboptimal contrast bolus timing.  Interval resolution of
the saddle embolus seen on the prior exam.  Subsegmental lower lobe
branches are suboptimally evaluated on today's study, but outside
of these there is no focal filling defect identified within the
pulmonary arterial tree to suggest the presence of acute or
definite residual embolus.  Partial volume averaging through the
right hilar lymph nodes is seen (series 2 image 85).

Evaluation of lung windows demonstrates increased airspace
consolidation in the posterior basal segment right lower lobe.
Previously there was wedge-shaped reticular - ground-glass opacity
in this region, this likely reflects evolution of pulmonary
infarct.  Superinfection of this focus cannot be excluded.  As
similar but smaller wedge-shaped area of airspace consolidation in
the lateral basal segment of the left lower lobe is also increased.
Elsewhere there is atelectasis and scattered mild ground-glass
opacity which appears more likely related to atelectasis and a
mosaic perfusion.  No pleural effusion or pneumothorax.

No pericardial effusion.  Visualized upper abdominal viscera are
stable with small hiatal hernia re-identified.  Stable scattered
sub centimeter mediastinal lymph nodes.  No axillary
lymphadenopathy.  Visualized thoracic inlet within normal limits.
No acute osseous abnormality identified.
IMPRESSION: 1.  Interval resolution of saddle pulmonary embolus previously
seen.  Suboptimal contrast bolus timing on today's exam results [REDACTED]reased evaluation of subsegmental lower lobe branches, but no
definite acute/residual embolus identified.
2.  Evolution of right lower lobe posterior basal segment pulmonary
infarct.  Evolution of smaller probable left lower lobe lateral
basal segment infarct.  Superinfection of these is not excluded.

## 2008-08-03 IMAGING — CR DG KNEE COMPLETE 4+V*R*
4 series · 4 of 4 positions shown · non-contrast
Comparison: 12/28/2007.

CLINICAL DATA: 61-year-old female with right knee pain and swelling
after recent intramedullary rod placement.

RIGHT KNEE - COMPLETE 4+ VIEW

[t knee ap right]
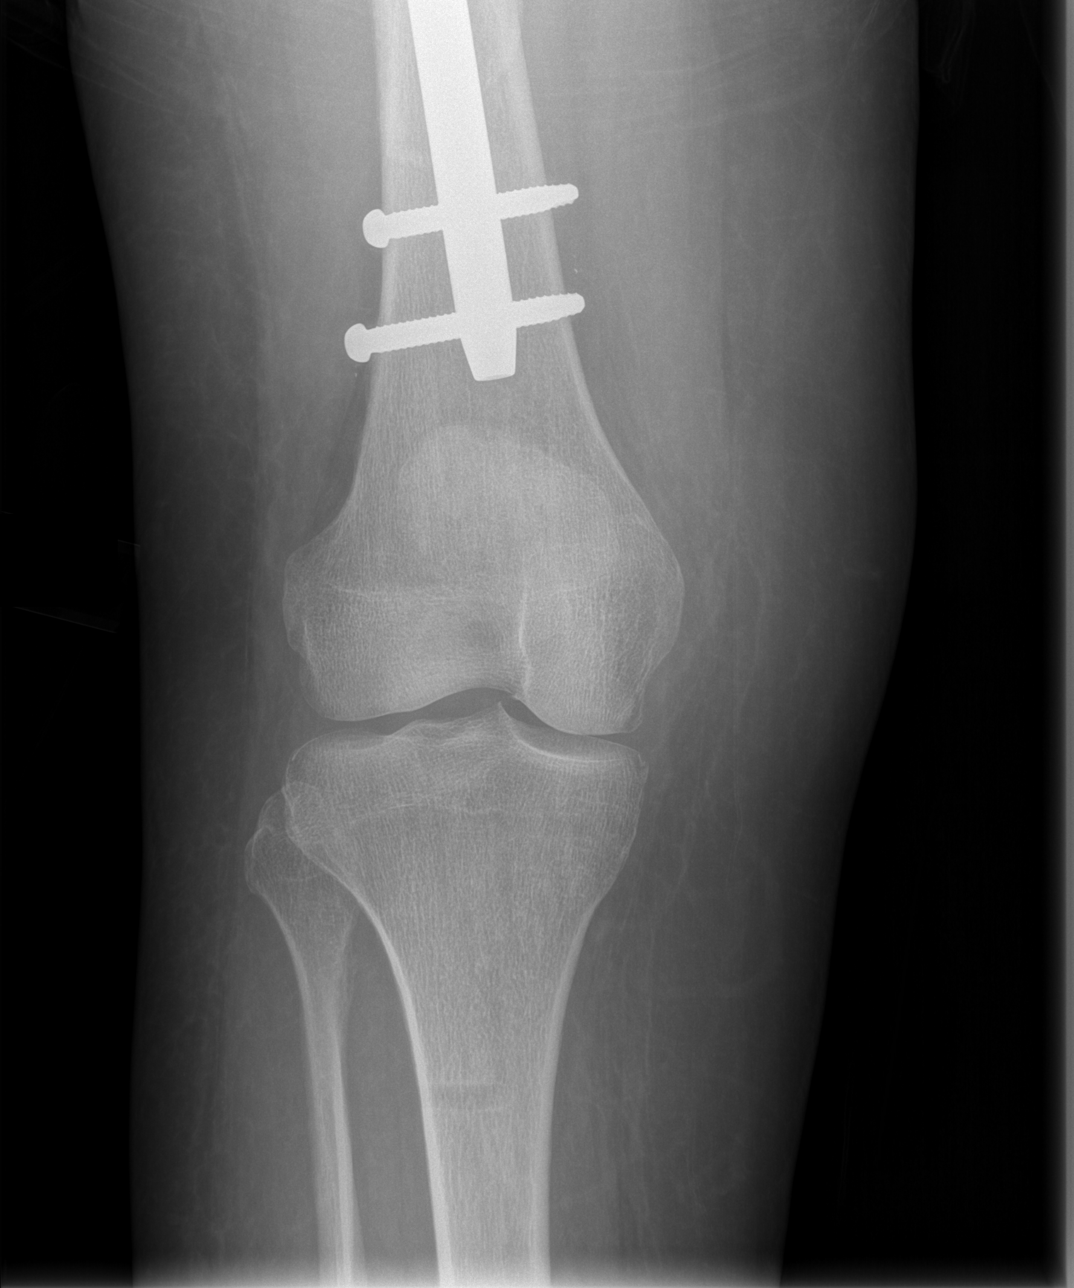

[t knee oblique right (1 of 2)]
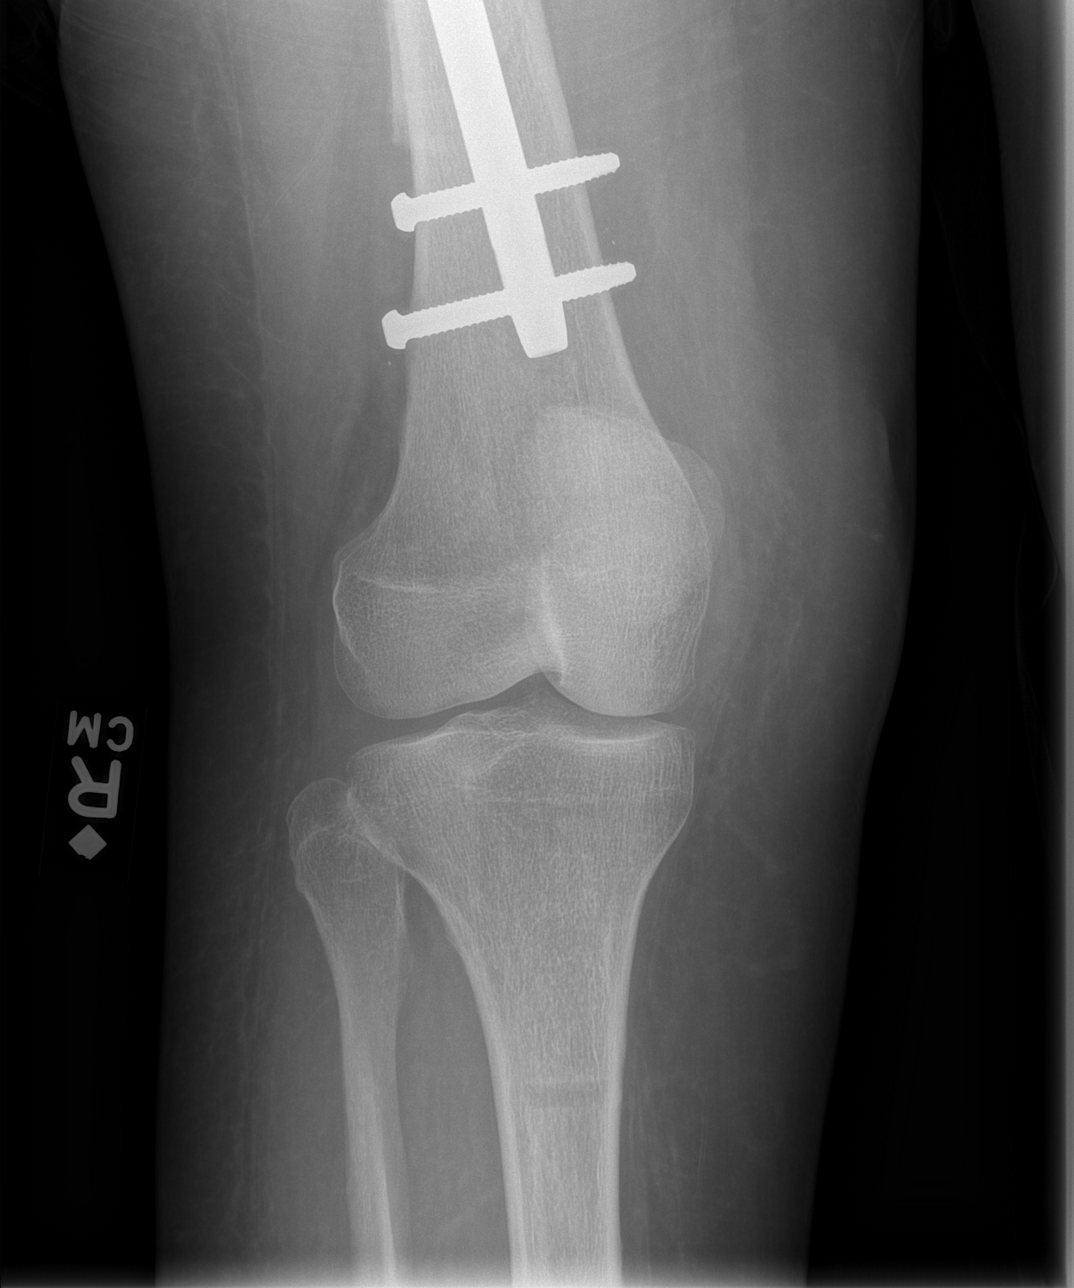

[t knee oblique right (2 of 2)]
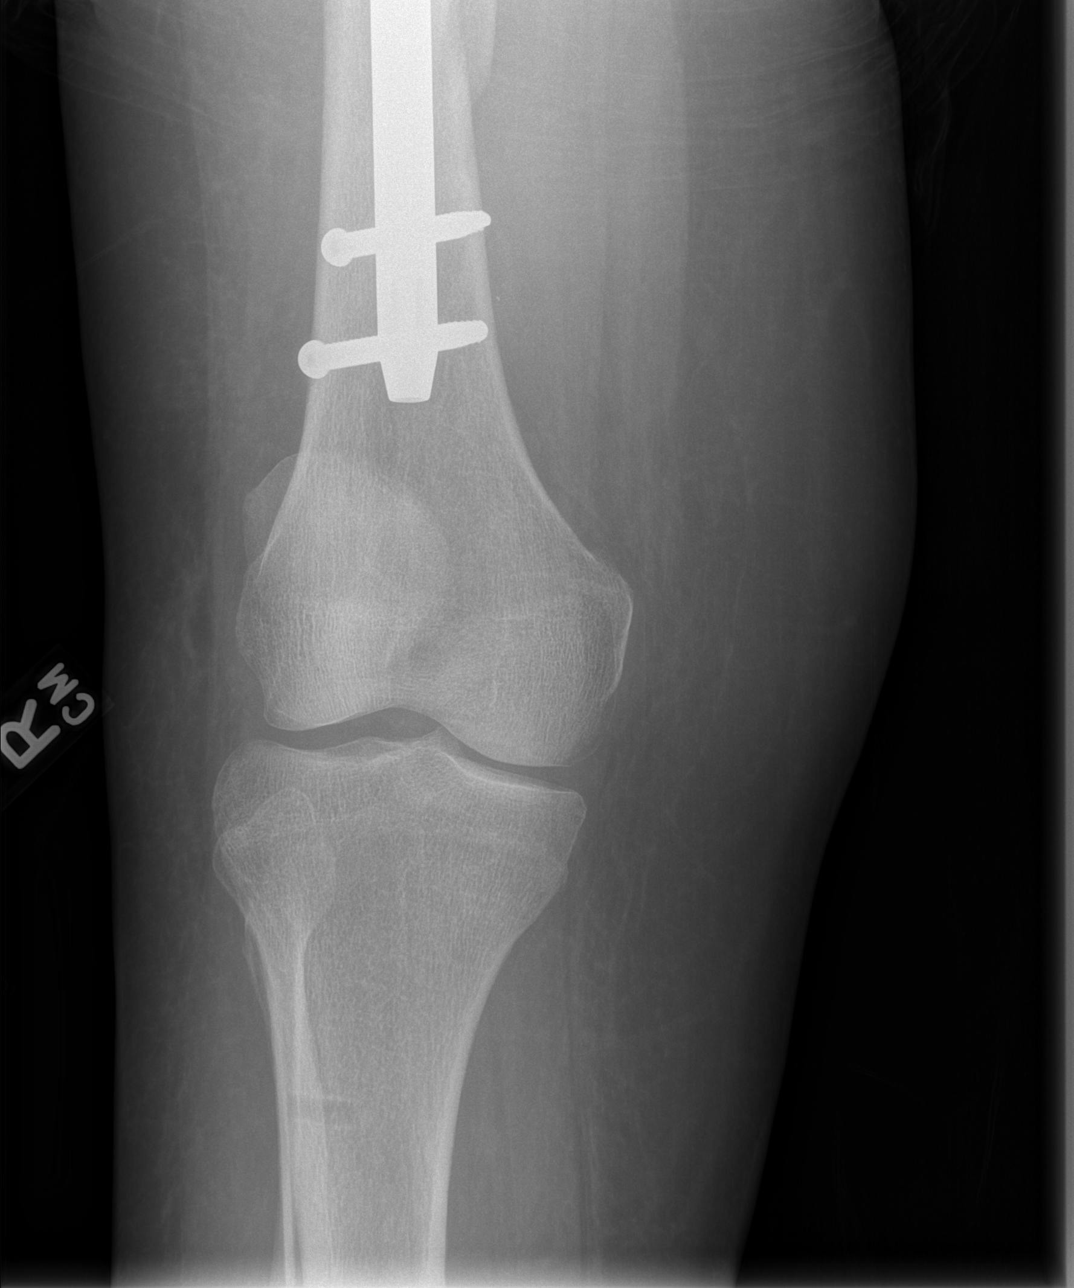

[t knee lat right]
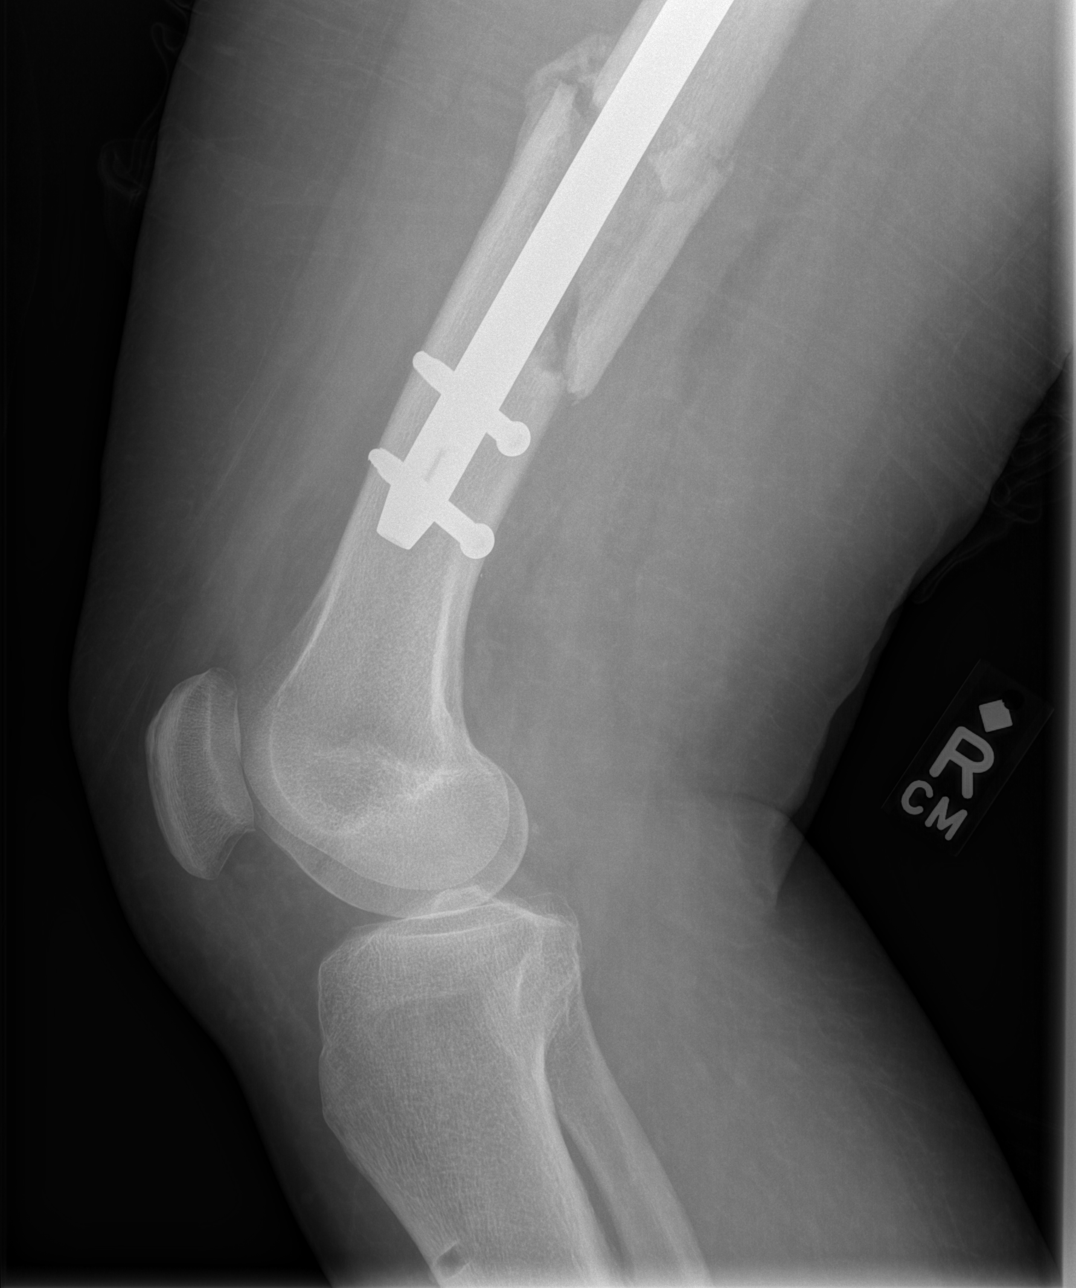

[4 of 4 positions shown; findings below may reference images not displayed]

FINDINGS: No knee joint effusion.  Patella appears intact.  Knee
joint spaces are preserved.  Lucent defect from cortical hardware
now removed in the proximal tibia.

Partial visualization of right femoral intramedullary rod with to
the distal cortical anchoring screws.  Comminuted fracture of the
distal right femoral shaft is traversed by this rod.  Alignment of
comminution fragments in the distal fragments are stable and there
is interval periosteal new bone formation.  No complete osseous
union at this site.  The visualized hardware is intact, the
proximal aspect of the intramedullary rod is not imaged.
IMPRESSION: 1.  Right femoral hardware partially visualized with no adverse
hardware features.
2.  Stable comminuted distal right femoral shaft fracture with
periosteal new bone formation.
3.  No acute fracture or dislocation about the right knee.

## 2011-02-07 NOTE — Discharge Summary (Signed)
NAMECARINNE, [name] NO.:  [id number]   MEDICAL RECORD NO.:  [id number]          PATIENT TYPE:   LOCATION:                                 FACILITY:   PHYSICIAN:  Manning Charity, MD          DATE OF BIRTH:   DATE OF ADMISSION:  01/09/2008  DATE OF DISCHARGE:  01/10/2008                               DISCHARGE SUMMARY   DISCHARGE DIAGNOSES:  1. Pleurisy.  2. Pneumonia.  3. Saddle pulmonary embolism.  4. History of deep venous thrombosis.  5. Hypertension.  6. Migraine.  7. Status post right femur fracture and repair.  8. Gastroesophageal reflux disease.  9. Peripheral neuropathy.   DISCHARGE MEDICATIONS:  1. Atacand 32 mg daily.  2. Coumadin 7.5 mg Friday date of discharge, 5 mg Saturday, and Sunday  3. Hydrochlorothiazide 25 mg.  4. Imitrex 100 mg.  5. Metoprolol 50 mg 2 tablet per day.  6. Nexium 40 mg daily.  7. Topiramate 25 mg 2 tablet a day.  8. Vicodin every 6 hours as needed for pain.   DISPOSITION AND FOLLOWUP:  [name] [facility] to follow up INR level.  If INR is not therapeutic, she will  need Lovenox prescription.  She will also need an anemia panel and  repeat EKG to followup prolong QT.  [name] chose to continue  receiving her care at the [facility] secondary to insurance  issues.   PROCEDURE PERFORMED:  CT of chest that showed interval resolution of  saddle pulmonary embolus previously seen.  Evaluation of the smaller  probably left lower lobe lateral segment infarct.  Superinfection of  this is not excluded.   BRIEF HISTORY OF PRESENT ILLNESS:  [name] is a 65 year old female  with history of hypertension, DVT, and a saddle PE, who was on Coumadin,  who presented to the emergency department complaining of upper back and  chest pain radiating into the front side, were aggravated by inspiration  that started suddenly.  She relates dyspnea at the moment the chest pain  started.  She  also relates night sweats and some chills since 2 weeks  ago.  She also relates some dry cough on and off.   PHYSICAL EXAMINATION:  VITAL SIGNS:  Temperature 97.6, blood pressure  145/82, pulse 82, respirations 20, and oxygen saturation 96% on room  air.  GENERAL:  The patient is in no acute distress.  RESPIRATORY:  Good air movement.  Mild crackles at the right lower lobe.  No wheezing.  No rhonchi.  CARDIOVASCULAR:  S1 and S2.  Regular rhythm and rate.  GASTROINTESTINAL:  Soft, nontender, and nondistended.  Bowel sounds  positive.  EXTREMITIES:  Right leg +1 edema.  Peripheral pulses present.  SKIN:  No rashes.  Normal skin turgor.  NEUROLOGIC:  The patient is alert and oriented x3.  Cranial nerves II  through XII intact.  Gait appropriate.   ADMISSION LABORATORY DATA:  Cardiac enzymes troponin 0.05, myoglobin 72,  CK-MB 1.3, D-dimer 1.5, PT 18, and INR  1.5.  CBC 12.8, hemoglobin 12.3,  hematocrit 38, platelets 358, ANC 8.5, MCV 85, RDW 14, sodium 140,  potassium 3.1, chloride 102, bicarb 26, BUN 15, creatinine 0.9, and  glucose 129.   HOSPITAL COURSE:  1. Chest pain.  The patient was admitted to telemetry floor.  EKG was      ordered and it shows some T-wave inversion as in prior EKG. Cardiac      enzymes x3 negative.  CT of the chest was ordered with results as      above.  We are considering that the chest pain could be secondary      to pleurisy or secondary to small lung infarct.  On chest x-ray, it      could not be differentiated if the patient had superimposed      infection with the patient history of chills and cough, slightly      increased white blood cells, we started her on azithromycin.  Then,      we changed it to Augmentin p.o.  The patient was started on Vicodin      for pain and she was started on aspirin 325 p.o. daily.  There was      also a possibility that the chest pain could be secondary to      progression of PE due to subtherapeutic INR.  During       hospitalization, the patient experienced resolution of chest pain.   1. Leukocytosis.  The patient had a increased white blood cell of 12.8      on admission.  We are considering that this could be secondary to      pneumonia versus stress related.  She was also started on      azithromycin and then on Augmentin.   1. Saddle pulmonary embolism.  She was started on heparin and      continued the use of warfarin.  She was discharged on Lovenox and      she will need INR followup on Monday, January 13, 2008.   1. Hypokalemia.  Potassium during admission was 3.2.  We will replete      potassium.  We were considering that some of her ST inversion could      be secondary to hypokalemia.   1. Prolonged QT.  EKG showed prolonged QT.  She was hypokalemic.      Potassium was repleted and azithromycin was discontinued.  Chest x-      ray was repeated, and the prolonged QT was improving.  She will      need an EKG followup.   DISCHARGE LABORATORY DATA:  [name] from the date of discharge was in  good condition.  She  related no chest pain.  No dizziness.  Blood  pressure was stable 115/60, pulse 74, oxygen saturation 94% on room air,  and  temperature 98 degrees.  White blood cell trending down 9.1, hemoglobin  11.3, hematocrit 35.9, platelets 637, MCV 86, neutrophils 57.  Sodium  138, potassium 4.4, bicarb 25, chloride 103, BUN 8, creatinine 0.75,  glucose 94, INR on the day of discharge was 1.7.      Sydney Barefoot, MD  Electronically Signed      Manning Charity, MD  Electronically Signed    BR/MEDQ  D:  01/10/2008  T:  01/11/2008  Job:  [id number]
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
## 2011-06-20 LAB — CBC
HCT: 29.6 — ABNORMAL LOW
HCT: 29.9 — ABNORMAL LOW
HCT: 30.7 — ABNORMAL LOW
HCT: 30.8 — ABNORMAL LOW
HCT: 36.2
HCT: 35.7 — ABNORMAL LOW
Hemoglobin: 10.1 — ABNORMAL LOW
Hemoglobin: 10.4 — ABNORMAL LOW
Hemoglobin: 10.4 — ABNORMAL LOW
Hemoglobin: 12
Hemoglobin: 11.3 — ABNORMAL LOW
Hemoglobin: 12.3
Hemoglobin: 12.3
MCHC: 33.7
MCHC: 33.9
MCHC: 34
MCHC: 34.2
MCHC: 33.5
MCV: 87
MCV: 87.1
MCV: 87.7
MCV: 87.8
MCV: 88.5
MCV: 85.7
Platelets: 291
Platelets: 338
Platelets: 347
RBC: 3.5 — ABNORMAL LOW
RBC: 4.09
RDW: 14.6
RDW: 14.7
RDW: 14.7
RDW: 15
RDW: 14.8
RDW: 14.9
WBC: 15.4 — ABNORMAL HIGH
WBC: 8.6
WBC: 9.6
WBC: 12.8 — ABNORMAL HIGH
WBC: 9.1

## 2011-06-20 LAB — POCT I-STAT, CHEM 8
BUN: 14
BUN: 15
Calcium, Ion: 1.11 — ABNORMAL LOW
Calcium, Ion: 1.17
Chloride: 102
Creatinine, Ser: 0.9
Glucose, Bld: 129 — ABNORMAL HIGH
HCT: 37
Potassium: 3.1 — ABNORMAL LOW
Sodium: 139
TCO2: 26

## 2011-06-20 LAB — PROTIME-INR
INR: 1
INR: 1.2
INR: 2.1 — ABNORMAL HIGH
INR: 1.5
INR: 1.6 — ABNORMAL HIGH
INR: 1.7 — ABNORMAL HIGH
Prothrombin Time: 13.5
Prothrombin Time: 20.6 — ABNORMAL HIGH
Prothrombin Time: 18.1 — ABNORMAL HIGH
Prothrombin Time: 20.9 — ABNORMAL HIGH

## 2011-06-20 LAB — URINALYSIS, ROUTINE W REFLEX MICROSCOPIC
Bilirubin Urine: NEGATIVE
Bilirubin Urine: NEGATIVE
Glucose, UA: NEGATIVE
Ketones, ur: NEGATIVE
Ketones, ur: NEGATIVE
Ketones, ur: NEGATIVE
Nitrite: NEGATIVE
Nitrite: NEGATIVE
Nitrite: NEGATIVE
Protein, ur: NEGATIVE
Specific Gravity, Urine: 1.004 — ABNORMAL LOW
Specific Gravity, Urine: 1.009
Urobilinogen, UA: 0.2
Urobilinogen, UA: 1
pH: 6.5
pH: 7
pH: 7

## 2011-06-20 LAB — COMPREHENSIVE METABOLIC PANEL
ALT: 19
Albumin: 3.5
Albumin: 3.4 — ABNORMAL LOW
Alkaline Phosphatase: 100
Alkaline Phosphatase: 115
BUN: 11
CO2: 23
Chloride: 103
GFR calc non Af Amer: >60
Glucose, Bld: 97
Potassium: 3 — ABNORMAL LOW
Potassium: 3.4 — ABNORMAL LOW
Sodium: 138
Total Bilirubin: 0.7
Total Protein: 7.3

## 2011-06-20 LAB — DIFFERENTIAL
Basophils Absolute: 0
Basophils Absolute: 0.1
Basophils Relative: 0
Basophils Relative: 1
Basophils Relative: 1
Eosinophils Absolute: 0.2
Eosinophils Absolute: 0.2
Eosinophils Relative: 0
Eosinophils Relative: 2
Lymphocytes Relative: 26
Lymphocytes Relative: 31
Lymphs Abs: 3.3
Monocytes Absolute: 1.1 — ABNORMAL HIGH
Monocytes Absolute: 0.6
Monocytes Absolute: 0.8
Monocytes Relative: 6
Monocytes Relative: 7
Neutro Abs: 12.7 — ABNORMAL HIGH
Neutro Abs: 4.9
Neutrophils Relative %: 57
Neutrophils Relative %: 57

## 2011-06-20 LAB — CARDIOLIPIN ANTIBODIES, IGG, IGM, IGA
Anticardiolipin IgG: <7 — ABNORMAL LOW (ref <11)
Anticardiolipin IgM: <7 — ABNORMAL LOW (ref <10)

## 2011-06-20 LAB — CK TOTAL AND CKMB (NOT AT ARMC)
Relative Index: INVALID
Total CK: 53

## 2011-06-20 LAB — APTT
aPTT: 107 — ABNORMAL HIGH
aPTT: 35

## 2011-06-20 LAB — BASIC METABOLIC PANEL
BUN: 3 — ABNORMAL LOW
BUN: 5 — ABNORMAL LOW
CO2: 23
CO2: 24
CO2: 27
CO2: 27
CO2: 25
Calcium: 8.3 — ABNORMAL LOW
Calcium: 8.6
Calcium: 8.9
Chloride: 103
Chloride: 104
Chloride: 107
Creatinine, Ser: 0.61
Creatinine, Ser: 0.73
Creatinine, Ser: 0.75
GFR calc Af Amer: >60
GFR calc Af Amer: >60
GFR calc non Af Amer: >60
Glucose, Bld: 107 — ABNORMAL HIGH
Glucose, Bld: 108 — ABNORMAL HIGH
Glucose, Bld: 123 — ABNORMAL HIGH
Glucose, Bld: 94
Potassium: 3.7
Potassium: 3.7
Sodium: 137
Sodium: 138
Sodium: 138

## 2011-06-20 LAB — POCT I-STAT 3, ART BLOOD GAS (G3+)
O2 Saturation: 97
TCO2: 23
pH, Arterial: 7.468 — ABNORMAL HIGH

## 2011-06-20 LAB — PROTHROMBIN GENE MUTATION

## 2011-06-20 LAB — BLOOD GAS, ARTERIAL
Acid-base deficit: 2.1 — ABNORMAL HIGH
Drawn by: 287601
FIO2: 0.32
pCO2 arterial: 34.2 — ABNORMAL LOW
pO2, Arterial: 75.6 — ABNORMAL LOW

## 2011-06-20 LAB — HEPARIN LEVEL (UNFRACTIONATED)
Heparin Unfractionated: 0.3
Heparin Unfractionated: 0.42
Heparin Unfractionated: 0.93 — ABNORMAL HIGH

## 2011-06-20 LAB — URINE MICROSCOPIC-ADD ON

## 2011-06-20 LAB — URINE CULTURE
Colony Count: >=100000
Colony Count: >=100000
Special Requests: NEGATIVE

## 2011-06-20 LAB — LUPUS ANTICOAGULANT PANEL
DRVVT: 53.1 — ABNORMAL HIGH (ref 36.1–47.0)
Lupus Anticoagulant: NOT DETECTED
PTTLA 4:1 Mix: 121.6 — ABNORMAL HIGH (ref 36.3–48.8)
PTTLA Confirmation: 32.6 — ABNORMAL HIGH (ref <8.0)
dRVVT Incubated 1:1 Mix: 43.4 (ref 36.1–47.0)

## 2011-06-20 LAB — POCT CARDIAC MARKERS: Myoglobin, poc: 72.9

## 2011-06-20 LAB — MAGNESIUM: Magnesium: 2.1

## 2011-06-20 LAB — CARDIAC PANEL(CRET KIN+CKTOT+MB+TROPI)
CK, MB: 0.7
Total CK: 48

## 2011-06-20 LAB — D-DIMER, QUANTITATIVE: D-Dimer, Quant: 1.5 — ABNORMAL HIGH

## 2011-06-20 LAB — BETA-2-GLYCOPROTEIN I ABS, IGG/M/A: Beta-2 Glyco I IgG: 7 U/mL (ref <20)

## 2015-11-03 DIAGNOSIS — Z0000 Encounter for general adult medical examination without abnormal findings: Secondary | ICD-10-CM | POA: Diagnosis not present

## 2015-12-09 DIAGNOSIS — Z23 Encounter for immunization: Secondary | ICD-10-CM | POA: Diagnosis not present

## 2015-12-09 DIAGNOSIS — I10 Essential (primary) hypertension: Secondary | ICD-10-CM | POA: Diagnosis not present

## 2015-12-09 DIAGNOSIS — F339 Major depressive disorder, recurrent, unspecified: Secondary | ICD-10-CM | POA: Diagnosis not present

## 2015-12-09 DIAGNOSIS — G43009 Migraine without aura, not intractable, without status migrainosus: Secondary | ICD-10-CM | POA: Diagnosis not present

## 2015-12-09 DIAGNOSIS — E785 Hyperlipidemia, unspecified: Secondary | ICD-10-CM | POA: Diagnosis not present

## 2015-12-09 DIAGNOSIS — Z0000 Encounter for general adult medical examination without abnormal findings: Secondary | ICD-10-CM | POA: Diagnosis not present

## 2015-12-09 DIAGNOSIS — G629 Polyneuropathy, unspecified: Secondary | ICD-10-CM | POA: Diagnosis not present

## 2015-12-09 DIAGNOSIS — K219 Gastro-esophageal reflux disease without esophagitis: Secondary | ICD-10-CM | POA: Diagnosis not present

## 2016-05-17 DIAGNOSIS — F329 Major depressive disorder, single episode, unspecified: Secondary | ICD-10-CM | POA: Diagnosis not present

## 2016-05-17 DIAGNOSIS — K219 Gastro-esophageal reflux disease without esophagitis: Secondary | ICD-10-CM | POA: Diagnosis not present

## 2016-05-17 DIAGNOSIS — G43009 Migraine without aura, not intractable, without status migrainosus: Secondary | ICD-10-CM | POA: Diagnosis not present

## 2016-05-17 DIAGNOSIS — I10 Essential (primary) hypertension: Secondary | ICD-10-CM | POA: Diagnosis not present

## 2016-05-17 DIAGNOSIS — Z6835 Body mass index (BMI) 35.0-35.9, adult: Secondary | ICD-10-CM | POA: Diagnosis not present

## 2017-01-28 DIAGNOSIS — I10 Essential (primary) hypertension: Secondary | ICD-10-CM | POA: Diagnosis not present

## 2017-01-28 DIAGNOSIS — Z760 Encounter for issue of repeat prescription: Secondary | ICD-10-CM | POA: Diagnosis not present

## 2017-01-28 DIAGNOSIS — M545 Low back pain: Secondary | ICD-10-CM | POA: Diagnosis not present

## 2017-02-01 ENCOUNTER — Encounter: Payer: Self-pay | Admitting: Adult Health

## 2017-02-01 ENCOUNTER — Ambulatory Visit (INDEPENDENT_AMBULATORY_CARE_PROVIDER_SITE_OTHER): Payer: Medicare Other | Admitting: Adult Health

## 2017-02-01 VITALS — BP 161/85 | HR 54 | Temp 97.8°F | Ht 68.0 in | Wt 225.0 lb

## 2017-02-01 DIAGNOSIS — I10 Essential (primary) hypertension: Secondary | ICD-10-CM | POA: Diagnosis not present

## 2017-02-01 DIAGNOSIS — R5383 Other fatigue: Secondary | ICD-10-CM | POA: Insufficient documentation

## 2017-02-01 DIAGNOSIS — Z0000 Encounter for general adult medical examination without abnormal findings: Secondary | ICD-10-CM

## 2017-02-01 DIAGNOSIS — G43909 Migraine, unspecified, not intractable, without status migrainosus: Secondary | ICD-10-CM | POA: Insufficient documentation

## 2017-02-01 DIAGNOSIS — Z833 Family history of diabetes mellitus: Secondary | ICD-10-CM | POA: Diagnosis not present

## 2017-02-01 DIAGNOSIS — M545 Low back pain, unspecified: Secondary | ICD-10-CM | POA: Insufficient documentation

## 2017-02-01 DIAGNOSIS — F339 Major depressive disorder, recurrent, unspecified: Secondary | ICD-10-CM | POA: Diagnosis not present

## 2017-02-01 DIAGNOSIS — E785 Hyperlipidemia, unspecified: Secondary | ICD-10-CM

## 2017-02-01 MED ORDER — SERTRALINE HCL 50 MG PO TABS: 50.0000 mg | ORAL_TABLET | Freq: Every day | ORAL | 2 refills | Status: DC

## 2017-02-01 MED ORDER — TOPIRAMATE 50 MG PO TABS: 50.0000 mg | ORAL_TABLET | Freq: Two times a day (BID) | ORAL | 2 refills | Status: DC

## 2017-02-01 MED ORDER — VALSARTAN 80 MG PO TABS: 80.0000 mg | ORAL_TABLET | Freq: Every day | ORAL | 2 refills | Status: DC

## 2017-02-01 MED ORDER — ATORVASTATIN CALCIUM 20 MG PO TABS: 20.0000 mg | ORAL_TABLET | Freq: Every day | ORAL | 2 refills | Status: DC

## 2017-02-01 MED ORDER — SUMATRIPTAN-NAPROXEN SODIUM 85-500 MG PO TABS: 1.0000 | ORAL_TABLET | ORAL | 2 refills | Status: DC | PRN

## 2017-02-01 NOTE — Assessment & Plan Note (Signed)
Continue Treximet and Topamax as directed. Declined referral to Neurology. Increase water intake to at least 100 ounces/day. OTC Excedrin per manufacturer's instructions.

## 2017-02-01 NOTE — Assessment & Plan Note (Signed)
Continue valsartan 80mg  daily. Reduce Na+ intake. Increase daily walking.

## 2017-02-01 NOTE — Assessment & Plan Note (Signed)
Continue Atorvastatin 20mg  daily. Follow heart healthy diet and increase daily movement to at least 1630mins/5 times a week.

## 2017-02-01 NOTE — Assessment & Plan Note (Signed)
She reports plans of suicide when/if terminal illness dx. We discussed this at length and she vehemently refused psychiatric care (I offered immediate hospitalization, mental health referral for therapy and medication optimization).  Information on depression and suicide hotline provided. Offer to call her family-declined. Sertraline Rx refilled. Consulted with Dr. Sharee Holsterpalski.

## 2017-02-01 NOTE — Assessment & Plan Note (Signed)
Will check TSH, Vit d, B12 levels.
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
## 2017-02-01 NOTE — Patient Instructions (Addendum)
Suicide prevention line ([phone] Or text [phone]  Major Depressive Disorder, Adult Major depressive disorder (MDD) is a mental health condition. It may also be called clinical depression or unipolar depression. MDD usually causes feelings of sadness, hopelessness, or helplessness. MDD can also cause physical symptoms. It can interfere with work, school, relationships, and other everyday activities. MDD may be mild, moderate, or severe. It may occur once (single episode major depressive disorder) or it may occur multiple times (recurrent major depressive disorder). What are the causes? The exact cause of this condition is not known. MDD is most likely caused by a combination of things, which may include:  Genetic factors. These are traits that are passed along from parent to child.  Individual factors. Your personality, your behavior, and the way you handle your thoughts and feelings may contribute to MDD. This includes personality traits and behaviors learned from others.  Physical factors, such as:  Differences in the part of your brain that controls emotion. This part of your brain may be different than it is in people who do not have MDD.  Long-term (chronic) medical or psychiatric illnesses.  Social factors. Traumatic experiences or major life changes may play a role in the development of MDD. What increases the risk? This condition is more likely to develop in women. The following factors may also make you more likely to develop MDD:  A family history of depression.  Troubled family relationships.  Abnormally low levels of certain brain chemicals.  Traumatic events in childhood, especially abuse or the loss of a parent.  Being under a lot of stress, or long-term stress, especially from upsetting life experiences or losses.  A history of:  Chronic physical illness.  Other mental health disorders.  Substance abuse.  Poor living conditions.  Experiencing social  exclusion or discrimination on a regular basis. What are the signs or symptoms? The main symptoms of MDD typically include:  Constant depressed or irritable mood.  Loss of interest in things and activities. MDD symptoms may also include:  Sleeping or eating too much or too little.  Unexplained weight change.  Fatigue or low energy.  Feelings of worthlessness or guilt.  Difficulty thinking clearly or making decisions.  Thoughts of suicide or of harming others.  Physical agitation or weakness.  Isolation. Severe cases of MDD may also occur with other symptoms, such as:  Delusions or hallucinations, in which you imagine things that are not real (psychotic depression).  Low-level depression that lasts at least a year (chronic depression or persistent depressive disorder).  Extreme sadness and hopelessness (melancholic depression).  Trouble speaking and moving (catatonic depression). How is this diagnosed? This condition may be diagnosed based on:  Your symptoms.  Your medical history, including your mental health history. This may involve tests to evaluate your mental health. You may be asked questions about your lifestyle, including any drug and alcohol use, and how long you have had symptoms of MDD.  A physical exam.  Blood tests to rule out other conditions. You must have a depressed mood and at least four other MDD symptoms most of the day, nearly every day in the same 2-week timeframe before your health care provider can confirm a diagnosis of MDD. How is this treated? This condition is usually treated by mental health professionals, such as psychologists, psychiatrists, and clinical social workers. You may need more than one type of treatment. Treatment may include:  Psychotherapy. This is also called talk therapy or counseling. Types of psychotherapy include:  Cognitive behavioral therapy (CBT). This type of therapy teaches you to recognize unhealthy feelings,  thoughts, and behaviors, and replace them with positive thoughts and actions.  Interpersonal therapy (IPT). This helps you to improve the way you relate to and communicate with others.  Family therapy. This treatment includes members of your family.  Medicine to treat anxiety and depression, or to help you control certain emotions and behaviors.  Lifestyle changes, such as:  Limiting alcohol and drug use.  Exercising regularly.  Getting plenty of sleep.  Making healthy eating choices.  Spending more time outdoors. Treatments involving stimulation of the brain can be used in situations with extremely severe symptoms, or when medicine or other therapies do not work over time. These treatments include electroconvulsive therapy, transcranial magnetic stimulation, and vagal nerve stimulation. Follow these instructions at home: Activity   Return to your normal activities as told by your health care provider.  Exercise regularly and spend time outdoors as told by your health care provider. General instructions   Take over-the-counter and prescription medicines only as told by your health care provider.  Do not drink alcohol. If you drink alcohol, limit your alcohol intake to no more than 1 drink a day for nonpregnant women and 2 drinks a day for men. One drink equals 12 oz of beer, 5 oz of wine, or 1 oz of hard liquor. Alcohol can affect any antidepressant medicines you are taking. Talk to your health care provider about your alcohol use.  Eat a healthy diet and get plenty of sleep.  Find activities that you enjoy doing, and make time to do them.  Consider joining a support group. Your health care provider may be able to recommend a support group.  Keep all follow-up visits as told by your health care provider. This is important. Where to find more information: [employer] on Mental Illness  www.nami.org [place] [employer] of Mental Health  http://www.maynard.net/ National  Suicide Prevention Lifeline  1-800-273-TALK 519-446-3055). This is free, 24-hour help. Contact a health care provider if:  Your symptoms get worse.  You develop new symptoms. Get help right away if:  You self-harm.  You have serious thoughts about hurting yourself or others.  You see, hear, taste, smell, or feel things that are not present (hallucinate). This information is not intended to replace advice given to you by your health care provider. Make sure you discuss any questions you have with your health care provider. Document Released: 01/06/2013 Document Revised: 05/18/2016 Document Reviewed: 03/22/2016 Elsevier Interactive Patient Education  2017 [employer]   Heart-Healthy Eating Plan Many factors influence your heart health, including eating and exercise habits. Heart (coronary) risk increases with abnormal blood fat (lipid) levels. Heart-healthy meal planning includes limiting unhealthy fats, increasing healthy fats, and making other small dietary changes. This includes maintaining a healthy body weight to help keep lipid levels within a normal range. What is my plan? Your health care provider recommends that you:  Get no more than _________% of the total calories in your daily diet from fat.  Limit your intake of saturated fat to less than _________% of your total calories each day.  Limit the amount of cholesterol in your diet to less than _________ mg per day. What types of fat should I choose?  Choose healthy fats more often. Choose monounsaturated and polyunsaturated fats, such as olive oil and canola oil, flaxseeds, walnuts, almonds, and seeds.  Eat more omega-3 fats. Good choices include salmon, mackerel, sardines, tuna, flaxseed oil,  and ground flaxseeds. Aim to eat fish at least two times each week.  Limit saturated fats. Saturated fats are primarily found in animal products, such as meats, butter, and cream. Plant sources of saturated fats include palm oil, palm  kernel oil, and coconut oil.  Avoid foods with partially hydrogenated oils in them. These contain trans fats. Examples of foods that contain trans fats are stick margarine, some tub margarines, cookies, crackers, and other baked goods. What general guidelines do I need to follow?  Check food labels carefully to identify foods with trans fats or high amounts of saturated fat.  Fill one half of your plate with vegetables and green salads. Eat 4-5 servings of vegetables per day. A serving of vegetables equals 1 cup of raw leafy vegetables,  cup of raw or cooked cut-up vegetables, or  cup of vegetable juice.  Fill one fourth of your plate with whole grains. Look for the word "whole" as the first word in the ingredient list.  Fill one fourth of your plate with lean protein foods.  Eat 4-5 servings of fruit per day. A serving of fruit equals one medium whole fruit,  cup of dried fruit,  cup of fresh, frozen, or canned fruit, or  cup of 100% fruit juice.  Eat more foods that contain soluble fiber. Examples of foods that contain this type of fiber are apples, broccoli, carrots, beans, peas, and barley. Aim to get 20-30 g of fiber per day.  Eat more home-cooked food and less restaurant, buffet, and fast food.  Limit or avoid alcohol.  Limit foods that are high in starch and sugar.  Avoid fried foods.  Cook foods by using methods other than frying. Baking, boiling, grilling, and broiling are all great options. Other fat-reducing suggestions include:  Removing the skin from poultry.  Removing all visible fats from meats.  Skimming the fat off of stews, soups, and gravies before serving them.  Steaming vegetables in water or broth.  Lose weight if you are overweight. Losing just 5-10% of your initial body weight can help your overall health and prevent diseases such as diabetes and heart disease.  Increase your consumption of nuts, legumes, and seeds to 4-5 servings per week. One  serving of dried beans or legumes equals  cup after being cooked, one serving of nuts equals 1 ounces, and one serving of seeds equals  ounce or 1 tablespoon.  You may need to monitor your salt (sodium) intake, especially if you have high blood pressure. Talk with your health care provider or dietitian to get more information about reducing sodium. What foods can I eat? Grains   Breads, including [place], white, pita, wheat, raisin, rye, oatmeal, and [place]. Tortillas that are neither fried nor made with lard or trans fat. Low-fat rolls, including hotdog and hamburger buns and English muffins. Biscuits. Muffins. Waffles. Pancakes. Light popcorn. Whole-grain cereals. Flatbread. Melba toast. Pretzels. Breadsticks. Rusks. Low-fat snacks and crackers, including oyster, saltine, matzo, graham, animal, and rye. Rice and pasta, including brown rice and those that are made with whole wheat. Vegetables  All vegetables. Fruits  All fruits, but limit coconut. Meats and Other Protein Sources  Lean, well-trimmed beef, veal, pork, and lamb. Chicken and [place] without skin. All fish and shellfish. Wild duck, rabbit, pheasant, and venison. Egg whites or low-cholesterol egg substitutes. Dried beans, peas, lentils, and tofu.Seeds and most nuts. Dairy  Low-fat or nonfat cheeses, including ricotta, string, and mozzarella. Skim or 1% milk that is liquid, powdered, or  evaporated. Buttermilk that is made with low-fat milk. Nonfat or low-fat yogurt. Beverages  Mineral water. Diet carbonated beverages. Sweets and Desserts  Sherbets and fruit ices. Honey, jam, marmalade, jelly, and syrups. Meringues and gelatins. Pure sugar candy, such as hard candy, jelly beans, gumdrops, mints, marshmallows, and small amounts of dark chocolate. [employer]. Eat all sweets and desserts in moderation. Fats and Oils  Nonhydrogenated (trans-free) margarines. Vegetable oils, including soybean, sesame, sunflower, olive, peanut,  safflower, corn, canola, and cottonseed. Salad dressings or mayonnaise that are made with a vegetable oil. Limit added fats and oils that you use for cooking, baking, salads, and as spreads. Other  Cocoa powder. Coffee and tea. All seasonings and condiments. The items listed above may not be a complete list of recommended foods or beverages. Contact your dietitian for more options.  What foods are not recommended? Grains  Breads that are made with saturated or trans fats, oils, or whole milk. Croissants. Butter rolls. Cheese breads. Sweet rolls. Donuts. Buttered popcorn. Chow mein noodles. High-fat crackers, such as cheese or butter crackers. Meats and Other Protein Sources  Fatty meats, such as hotdogs, short ribs, sausage, spareribs, bacon, ribeye roast or steak, and mutton. High-fat deli meats, such as salami and bologna. Caviar. Domestic duck and goose. Organ meats, such as kidney, liver, sweetbreads, brains, gizzard, chitterlings, and heart. Dairy  Cream, sour cream, cream cheese, and creamed cottage cheese. Whole milk cheeses, including blue (bleu), [address], [place], [place], [address], [place], [address], [place], [place], and [place]. Whole or 2% milk that is liquid, evaporated, or condensed. Whole buttermilk. Cream sauce or high-fat cheese sauce. Yogurt that is made from whole milk. Beverages  Regular sodas and drinks with added sugar. Sweets and Desserts  Frosting. Pudding. Cookies. Cakes other than angel food cake. Candy that has milk chocolate or white chocolate, hydrogenated fat, butter, coconut, or unknown ingredients. Buttered syrups. Full-fat ice cream or ice cream drinks. Fats and Oils  Gravy that has suet, meat fat, or shortening. Cocoa butter, hydrogenated oils, palm oil, coconut oil, palm kernel oil. These can often be found in baked products, candy, fried foods, nondairy creamers, and whipped toppings. Solid fats and shortenings, including bacon fat, salt pork, lard, and butter.  Nondairy cream substitutes, such as coffee creamers and sour cream substitutes. Salad dressings that are made of unknown oils, cheese, or sour cream. The items listed above may not be a complete list of foods and beverages to avoid. Contact your dietitian for more information.  This information is not intended to replace advice given to you by your health care provider. Make sure you discuss any questions you have with your health care provider. Document Released: 06/20/2008 Document Revised: 03/31/2016 Document Reviewed: 03/05/2014 Elsevier Interactive Patient Education  2017 [employer].  Medications refilled. Please make fasting lab nurse visit at your convenience. Increase walking once back pain has subsided. If feel Follow-up in 6 months, sooner if needed.

## 2017-04-10 ENCOUNTER — Telehealth: Payer: Self-pay

## 2017-04-10 ENCOUNTER — Other Ambulatory Visit: Payer: Self-pay | Admitting: Adult Health

## 2017-04-10 DIAGNOSIS — I10 Essential (primary) hypertension: Secondary | ICD-10-CM

## 2017-04-10 MED ORDER — LOSARTAN POTASSIUM 50 MG PO TABS
50.0000 mg | ORAL_TABLET | Freq: Every day | ORAL | 3 refills | Status: DC
Start: 2017-04-10 — End: 2018-01-21

## 2017-04-10 NOTE — Telephone Encounter (Signed)
Change of medication request for valsartan from pharmacy due to recall.  Please advise.

## 2017-04-10 NOTE — Telephone Encounter (Signed)
Evening, I dc'd Valsartan and started Losartan 50mg . Please call pt and let her know. Thanks! Orpha BurKaty

## 2017-07-12 ENCOUNTER — Other Ambulatory Visit (INDEPENDENT_AMBULATORY_CARE_PROVIDER_SITE_OTHER): Payer: Medicare Other

## 2017-07-12 DIAGNOSIS — R6889 Other general symptoms and signs: Secondary | ICD-10-CM | POA: Diagnosis not present

## 2017-07-12 DIAGNOSIS — M810 Age-related osteoporosis without current pathological fracture: Secondary | ICD-10-CM | POA: Diagnosis not present

## 2017-07-12 DIAGNOSIS — E559 Vitamin D deficiency, unspecified: Secondary | ICD-10-CM | POA: Diagnosis not present

## 2017-07-12 DIAGNOSIS — Z833 Family history of diabetes mellitus: Secondary | ICD-10-CM | POA: Diagnosis not present

## 2017-07-12 DIAGNOSIS — E785 Hyperlipidemia, unspecified: Secondary | ICD-10-CM

## 2017-07-12 DIAGNOSIS — R5383 Other fatigue: Secondary | ICD-10-CM | POA: Diagnosis not present

## 2017-07-12 DIAGNOSIS — R7989 Other specified abnormal findings of blood chemistry: Secondary | ICD-10-CM | POA: Diagnosis not present

## 2017-07-13 LAB — COMPREHENSIVE METABOLIC PANEL
A/G RATIO: 1.6 (ref 1.2–2.2)
ALT: 7 IU/L (ref 0–32)
AST: 11 IU/L (ref 0–40)
Albumin: 4.1 g/dL (ref 3.6–4.8)
Alkaline Phosphatase: 98 IU/L (ref 39–117)
BUN/Creatinine Ratio: 17 (ref 12–28)
BUN: 14 mg/dL (ref 8–27)
Bilirubin Total: 0.4 mg/dL (ref 0.0–1.2)
CO2: 23 mmol/L (ref 20–29)
Calcium: 9.3 mg/dL (ref 8.7–10.3)
Chloride: 106 mmol/L (ref 96–106)
Creatinine, Ser: 0.82 mg/dL (ref 0.57–1.00)
GFR calc Af Amer: 84 mL/min/{1.73_m2} (ref >59)
GFR, EST NON AFRICAN AMERICAN: 73 mL/min/{1.73_m2} (ref >59)
GLUCOSE: 96 mg/dL (ref 65–99)
Globulin, Total: 2.6 g/dL (ref 1.5–4.5)
POTASSIUM: 4.6 mmol/L (ref 3.5–5.2)
Sodium: 144 mmol/L (ref 134–144)
TOTAL PROTEIN: 6.7 g/dL (ref 6.0–8.5)

## 2017-07-13 LAB — CBC
HEMOGLOBIN: 14.3 g/dL (ref 11.1–15.9)
Hematocrit: 43.9 % (ref 34.0–46.6)
MCH: 29.5 pg (ref 26.6–33.0)
MCHC: 32.6 g/dL (ref 31.5–35.7)
MCV: 91 fL (ref 79–97)
PLATELETS: 295 10*3/uL (ref 150–379)
RBC: 4.84 x10E6/uL (ref 3.77–5.28)
RDW: 14.1 % (ref 12.3–15.4)
WBC: 7.3 10*3/uL (ref 3.4–10.8)

## 2017-07-13 LAB — LIPID PANEL
Chol/HDL Ratio: 3.4 ratio (ref 0.0–4.4)
Cholesterol, Total: 178 mg/dL (ref 100–199)
HDL: 52 mg/dL (ref >39)
LDL Calculated: 97 mg/dL (ref 0–99)
TRIGLYCERIDES: 147 mg/dL (ref 0–149)
VLDL CHOLESTEROL CAL: 29 mg/dL (ref 5–40)

## 2017-07-13 LAB — VITAMIN D 25 HYDROXY (VIT D DEFICIENCY, FRACTURES): Vit D, 25-Hydroxy: 15.2 ng/mL — ABNORMAL LOW (ref 30.0–100.0)

## 2017-07-13 LAB — TSH: TSH: 4.95 u[IU]/mL — ABNORMAL HIGH (ref 0.450–4.500)

## 2017-07-13 LAB — HEMOGLOBIN A1C
Est. average glucose Bld gHb Est-mCnc: 108 mg/dL
Hgb A1c MFr Bld: 5.4 % (ref 4.8–5.6)

## 2017-07-13 LAB — VITAMIN B12: VITAMIN B 12: 263 pg/mL (ref 232–1245)
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
## 2017-07-19 ENCOUNTER — Encounter: Payer: Self-pay | Admitting: Adult Health

## 2017-07-19 ENCOUNTER — Ambulatory Visit (INDEPENDENT_AMBULATORY_CARE_PROVIDER_SITE_OTHER): Payer: Medicare Other | Admitting: Adult Health

## 2017-07-19 VITALS — BP 129/85 | HR 61 | Ht 68.0 in | Wt 219.8 lb

## 2017-07-19 DIAGNOSIS — G629 Polyneuropathy, unspecified: Secondary | ICD-10-CM | POA: Insufficient documentation

## 2017-07-19 DIAGNOSIS — F339 Major depressive disorder, recurrent, unspecified: Secondary | ICD-10-CM

## 2017-07-19 DIAGNOSIS — F419 Anxiety disorder, unspecified: Secondary | ICD-10-CM | POA: Insufficient documentation

## 2017-07-19 DIAGNOSIS — Z23 Encounter for immunization: Secondary | ICD-10-CM

## 2017-07-19 DIAGNOSIS — I10 Essential (primary) hypertension: Secondary | ICD-10-CM

## 2017-07-19 DIAGNOSIS — M545 Low back pain: Secondary | ICD-10-CM

## 2017-07-19 DIAGNOSIS — E785 Hyperlipidemia, unspecified: Secondary | ICD-10-CM

## 2017-07-19 DIAGNOSIS — Z0000 Encounter for general adult medical examination without abnormal findings: Secondary | ICD-10-CM | POA: Insufficient documentation

## 2017-07-19 DIAGNOSIS — G43909 Migraine, unspecified, not intractable, without status migrainosus: Secondary | ICD-10-CM | POA: Diagnosis not present

## 2017-07-19 DIAGNOSIS — F40243 Fear of flying: Secondary | ICD-10-CM

## 2017-07-19 MED ORDER — ALPRAZOLAM 0.25 MG PO TBDP: 0.2500 mg | ORAL_TABLET | Freq: Three times a day (TID) | ORAL | 0 refills | Status: DC | PRN

## 2017-07-19 MED ORDER — GABAPENTIN 300 MG PO CAPS: 300.0000 mg | ORAL_CAPSULE | Freq: Two times a day (BID) | ORAL | 1 refills | Status: DC

## 2017-07-19 MED ORDER — ONDANSETRON 8 MG PO TBDP: 8.0000 mg | ORAL_TABLET | Freq: Three times a day (TID) | ORAL | 0 refills | Status: DC | PRN

## 2017-07-19 NOTE — Assessment & Plan Note (Signed)
Well controlled with OTC Excedrin and only requires Sumatriptan-Naproxen 1-2 times/month. Encouraged to increase water intake and avoid HA triggers.

## 2017-07-19 NOTE — Patient Instructions (Signed)
Heart-Healthy Eating Plan Many factors influence your heart health, including eating and exercise habits. Heart (coronary) risk increases with abnormal blood fat (lipid) levels. Heart-healthy meal planning includes limiting unhealthy fats, increasing healthy fats, and making other small dietary changes. This includes maintaining a healthy body weight to help keep lipid levels within a normal range. What is my plan? Your health care provider recommends that you:  Get no more than __25__% of the total calories in your daily diet from fat.  Limit your intake of saturated fat to less than ___5__% of your total calories each day.  Limit the amount of cholesterol in your diet to less than __300__ mg per day.  What types of fat should I choose?  Choose healthy fats more often. Choose monounsaturated and polyunsaturated fats, such as olive oil and canola oil, flaxseeds, walnuts, almonds, and seeds.  Eat more omega-3 fats. Good choices include salmon, mackerel, sardines, tuna, flaxseed oil, and ground flaxseeds. Aim to eat fish at least two times each week.  Limit saturated fats. Saturated fats are primarily found in animal products, such as meats, butter, and cream. Plant sources of saturated fats include palm oil, palm kernel oil, and coconut oil.  Avoid foods with partially hydrogenated oils in them. These contain trans fats. Examples of foods that contain trans fats are stick margarine, some tub margarines, cookies, crackers, and other baked goods. What general guidelines do I need to follow?  Check food labels carefully to identify foods with trans fats or high amounts of saturated fat.  Fill one half of your plate with vegetables and green salads. Eat 4-5 servings of vegetables per day. A serving of vegetables equals 1 cup of raw leafy vegetables,  cup of raw or cooked cut-up vegetables, or  cup of vegetable juice.  Fill one fourth of your plate with whole grains. Look for the word "whole"  as the first word in the ingredient list.  Fill one fourth of your plate with lean protein foods.  Eat 4-5 servings of fruit per day. A serving of fruit equals one medium whole fruit,  cup of dried fruit,  cup of fresh, frozen, or canned fruit, or  cup of 100% fruit juice.  Eat more foods that contain soluble fiber. Examples of foods that contain this type of fiber are apples, broccoli, carrots, beans, peas, and barley. Aim to get 20-30 g of fiber per day.  Eat more home-cooked food and less restaurant, buffet, and fast food.  Limit or avoid alcohol.  Limit foods that are high in starch and sugar.  Avoid fried foods.  Cook foods by using methods other than frying. Baking, boiling, grilling, and broiling are all great options. Other fat-reducing suggestions include: ? Removing the skin from poultry. ? Removing all visible fats from meats. ? Skimming the fat off of stews, soups, and gravies before serving them. ? Steaming vegetables in water or broth.  Lose weight if you are overweight. Losing just 5-10% of your initial body weight can help your overall health and prevent diseases such as diabetes and heart disease.  Increase your consumption of nuts, legumes, and seeds to 4-5 servings per week. One serving of dried beans or legumes equals  cup after being cooked, one serving of nuts equals 1 ounces, and one serving of seeds equals  ounce or 1 tablespoon.  You may need to monitor your salt (sodium) intake, especially if you have high blood pressure. Talk with your health care provider or dietitian to get  more information about reducing sodium. What foods can I eat? Grains  Breads, including [place], white, pita, wheat, raisin, rye, oatmeal, and [place]. Tortillas that are neither fried nor made with lard or trans fat. Low-fat rolls, including hotdog and hamburger buns and English muffins. Biscuits. Muffins. Waffles. Pancakes. Light popcorn. Whole-grain cereals. Flatbread. Melba  toast. Pretzels. Breadsticks. Rusks. Low-fat snacks and crackers, including oyster, saltine, matzo, graham, animal, and rye. Rice and pasta, including brown rice and those that are made with whole wheat. Vegetables All vegetables. Fruits All fruits, but limit coconut. Meats and Other Protein Sources Lean, well-trimmed beef, veal, pork, and lamb. Chicken and [place] without skin. All fish and shellfish. Wild duck, rabbit, pheasant, and venison. Egg whites or low-cholesterol egg substitutes. Dried beans, peas, lentils, and tofu.Seeds and most nuts. Dairy Low-fat or nonfat cheeses, including ricotta, string, and mozzarella. Skim or 1% milk that is liquid, powdered, or evaporated. Buttermilk that is made with low-fat milk. Nonfat or low-fat yogurt. Beverages Mineral water. Diet carbonated beverages. Sweets and Desserts Sherbets and fruit ices. Honey, jam, marmalade, jelly, and syrups. Meringues and gelatins. Pure sugar candy, such as hard candy, jelly beans, gumdrops, mints, marshmallows, and small amounts of dark chocolate. [employer] Eat all sweets and desserts in moderation. Fats and Oils Nonhydrogenated (trans-free) margarines. Vegetable oils, including soybean, sesame, sunflower, olive, peanut, safflower, corn, canola, and cottonseed. Salad dressings or mayonnaise that are made with a vegetable oil. Limit added fats and oils that you use for cooking, baking, salads, and as spreads. Other Cocoa powder. Coffee and tea. All seasonings and condiments. The items listed above may not be a complete list of recommended foods or beverages. Contact your dietitian for more options. What foods are not recommended? Grains Breads that are made with saturated or trans fats, oils, or whole milk. Croissants. Butter rolls. Cheese breads. Sweet rolls. Donuts. Buttered popcorn. Chow mein noodles. High-fat crackers, such as cheese or butter crackers. Meats and Other Protein Sources Fatty meats, such as  hotdogs, short ribs, sausage, spareribs, bacon, ribeye roast or steak, and mutton. High-fat deli meats, such as salami and bologna. Caviar. Domestic duck and goose. Organ meats, such as kidney, liver, sweetbreads, brains, gizzard, chitterlings, and heart. Dairy Cream, sour cream, cream cheese, and creamed cottage cheese. Whole milk cheeses, including blue (bleu), Monterey Jack, [place], [place], American, [place], Swiss, [place], [place], and [place]. Whole or 2% milk that is liquid, evaporated, or condensed. Whole buttermilk. Cream sauce or high-fat cheese sauce. Yogurt that is made from whole milk. Beverages Regular sodas and drinks with added sugar. Sweets and Desserts Frosting. Pudding. Cookies. Cakes other than angel food cake. Candy that has milk chocolate or white chocolate, hydrogenated fat, butter, coconut, or unknown ingredients. Buttered syrups. Full-fat ice cream or ice cream drinks. Fats and Oils Gravy that has suet, meat fat, or shortening. Cocoa butter, hydrogenated oils, palm oil, coconut oil, palm kernel oil. These can often be found in baked products, candy, fried foods, nondairy creamers, and whipped toppings. Solid fats and shortenings, including bacon fat, salt pork, lard, and butter. Nondairy cream substitutes, such as coffee creamers and sour cream substitutes. Salad dressings that are made of unknown oils, cheese, or sour cream. The items listed above may not be a complete list of foods and beverages to avoid. Contact your dietitian for more information. This information is not intended to replace advice given to you by your health care provider. Make sure you discuss any questions you have with your health care  provider. Document Released: 06/20/2008 Document Revised: 03/31/2016 Document Reviewed: 03/05/2014 Elsevier Interactive Patient Education  2017 [employer]    Back Exercises The following exercises strengthen the muscles that help to support the back. They also help  to keep the lower back flexible. Doing these exercises can help to prevent back pain or lessen existing pain. If you have back pain or discomfort, try doing these exercises 2-3 times each day or as told by your health care provider. When the pain goes away, do them once each day, but increase the number of times that you repeat the steps for each exercise (do more repetitions). If you do not have back pain or discomfort, do these exercises once each day or as told by your health care provider. Exercises Single Knee to Chest  Repeat these steps 3-5 times for each leg: 1. Lie on your back on a firm bed or the floor with your legs extended. 2. Bring one knee to your chest. Your other leg should stay extended and in contact with the floor. 3. Hold your knee in place by grabbing your knee or thigh. 4. Pull on your knee until you feel a gentle stretch in your lower back. 5. Hold the stretch for 10-30 seconds. 6. Slowly release and straighten your leg.  Pelvic Tilt  Repeat these steps 5-10 times: 1. Lie on your back on a firm bed or the floor with your legs extended. 2. Bend your knees so they are pointing toward the ceiling and your feet are flat on the floor. 3. Tighten your lower abdominal muscles to press your lower back against the floor. This motion will tilt your pelvis so your tailbone points up toward the ceiling instead of pointing to your feet or the floor. 4. With gentle tension and even breathing, hold this position for 5-10 seconds.  Cat-Cow  Repeat these steps until your lower back becomes more flexible: 1. Get into a hands-and-knees position on a firm surface. Keep your hands under your shoulders, and keep your knees under your hips. You may place padding under your knees for comfort. 2. Let your head hang down, and point your tailbone toward the floor so your lower back becomes rounded like the back of a cat. 3. Hold this position for 5 seconds. 4. Slowly lift your head and point  your tailbone up toward the ceiling so your back forms a sagging arch like the back of a cow. 5. Hold this position for 5 seconds.  Press-Ups  Repeat these steps 5-10 times: 1. Lie on your abdomen (face-down) on the floor. 2. Place your palms near your head, about shoulder-width apart. 3. While you keep your back as relaxed as possible and keep your hips on the floor, slowly straighten your arms to raise the top half of your body and lift your shoulders. Do not use your back muscles to raise your upper torso. You may adjust the placement of your hands to make yourself more comfortable. 4. Hold this position for 5 seconds while you keep your back relaxed. 5. Slowly return to lying flat on the floor.  Bridges  Repeat these steps 10 times: 1. Lie on your back on a firm surface. 2. Bend your knees so they are pointing toward the ceiling and your feet are flat on the floor. 3. Tighten your buttocks muscles and lift your buttocks off of the floor until your waist is at almost the same height as your knees. You should feel the muscles working in  your buttocks and the back of your thighs. If you do not feel these muscles, slide your feet 1-2 inches farther away from your buttocks. 4. Hold this position for 3-5 seconds. 5. Slowly lower your hips to the starting position, and allow your buttocks muscles to relax completely.  If this exercise is too easy, try doing it with your arms crossed over your chest. Abdominal Crunches  Repeat these steps 5-10 times: 1. Lie on your back on a firm bed or the floor with your legs extended. 2. Bend your knees so they are pointing toward the ceiling and your feet are flat on the floor. 3. Cross your arms over your chest. 4. Tip your chin slightly toward your chest without bending your neck. 5. Tighten your abdominal muscles and slowly raise your trunk (torso) high enough to lift your shoulder blades a tiny bit off of the floor. Avoid raising your torso higher  than that, because it can put too much stress on your low back and it does not help to strengthen your abdominal muscles. 6. Slowly return to your starting position.  Back Lifts Repeat these steps 5-10 times: 1. Lie on your abdomen (face-down) with your arms at your sides, and rest your forehead on the floor. 2. Tighten the muscles in your legs and your buttocks. 3. Slowly lift your chest off of the floor while you keep your hips pressed to the floor. Keep the back of your head in line with the curve in your back. Your eyes should be looking at the floor. 4. Hold this position for 3-5 seconds. 5. Slowly return to your starting position.  Contact a health care provider if:  Your back pain or discomfort gets much worse when you do an exercise.  Your back pain or discomfort does not lessen within 2 hours after you exercise. If you have any of these problems, stop doing these exercises right away. Do not do them again unless your health care provider says that you can. Get help right away if:  You develop sudden, severe back pain. If this happens, stop doing the exercises right away. Do not do them again unless your health care provider says that you can. This information is not intended to replace advice given to you by your health care provider. Make sure you discuss any questions you have with your health care provider. Document Released: 10/19/2004 Document Revised: 01/19/2016 Document Reviewed: 11/05/2014 Elsevier Interactive Patient Education  2017 [employer] Inc[employer]   Increase water intake, strive for at least 110 ounces/day.   Follow Heart Healthy diet Continue walking!  Continue all medications as directed. As needed Ondansetron and Alprazolam for flight to [place]. Labs look great! Please schedule full physical in 6 months. NICE TO SEE YOU!

## 2017-07-19 NOTE — Assessment & Plan Note (Signed)
Reviewed all labs at length. TSH slightly elevated and Free T4-WNL, will continue to monitor

## 2017-07-19 NOTE — Assessment & Plan Note (Signed)
BP at goal 129/85, HR 61 Continue Losartan 50mg  daily

## 2017-07-19 NOTE — Assessment & Plan Note (Signed)
Decreased neurontin 300mg  from TID to BID.

## 2017-07-19 NOTE — Assessment & Plan Note (Signed)
07/12/2017 Cholesterol, Total 100 - 199 mg/dL 098178   Triglycerides 0 - 149 mg/dL 119147   HDL >14>39 mg/dL 52   VLDL Cholesterol Cal 5 - 40 mg/dL 29   LDL Calculated 0 - 99 mg/dL 97   Chol/HDL Ratio 0.0 - 4.4 ratio 3.4    Liver Enzymes look great Continue Atorvastatin 20mg  daily

## 2017-07-19 NOTE — Assessment & Plan Note (Addendum)
Provided Back Exercise Information Continue to walk daily and work on wt loss.

## 2017-07-19 NOTE — Assessment & Plan Note (Signed)
[place] Controlled Substance Database reviewed-no contraindications noted. Alprazolam 0.25mg  Q8H PRN and Ondansetron 8mg  Q8H PRN

## 2017-07-19 NOTE — Assessment & Plan Note (Signed)
Stable Denies acute thoughts of harming herself/others. Continue Sertraline 50mg  daily. Continue regular walking and avoiding ETOH.

## 2017-07-21 LAB — T4, FREE: FREE T4: 0.9 ng/dL (ref 0.82–1.77)

## 2017-07-21 LAB — SPECIMEN STATUS REPORT

## 2017-08-01 ENCOUNTER — Other Ambulatory Visit: Payer: Medicare Other

## 2017-08-06 ENCOUNTER — Ambulatory Visit: Payer: Medicare Other | Admitting: Adult Health

## 2017-08-29 DIAGNOSIS — E559 Vitamin D deficiency, unspecified: Secondary | ICD-10-CM | POA: Insufficient documentation

## 2017-11-07 ENCOUNTER — Telehealth: Payer: Self-pay | Admitting: Adult Health

## 2017-11-07 MED ORDER — SERTRALINE HCL 50 MG PO TABS: 50.0000 mg | ORAL_TABLET | Freq: Every day | ORAL | 0 refills | Status: DC

## 2017-11-07 MED ORDER — TOPIRAMATE 50 MG PO TABS: 50.0000 mg | ORAL_TABLET | Freq: Two times a day (BID) | ORAL | 0 refills | Status: DC

## 2017-11-07 NOTE — Telephone Encounter (Signed)
Patient is requesting a refill of her Topamax and Zoloft. If approved please send to [facility].

## 2017-11-07 NOTE — Addendum Note (Signed)
Addended by: Stan HeadNELSON, TONYA S on: 11/07/2017 04:02 PM   Modules accepted: Orders
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
## 2018-01-21 ENCOUNTER — Encounter: Payer: Self-pay | Admitting: Adult Health

## 2018-01-21 ENCOUNTER — Ambulatory Visit (INDEPENDENT_AMBULATORY_CARE_PROVIDER_SITE_OTHER): Payer: Medicare Other | Admitting: Adult Health

## 2018-01-21 VITALS — BP 143/82 | HR 52 | Ht 68.0 in | Wt 215.6 lb

## 2018-01-21 DIAGNOSIS — R001 Bradycardia, unspecified: Secondary | ICD-10-CM | POA: Diagnosis not present

## 2018-01-21 DIAGNOSIS — R2689 Other abnormalities of gait and mobility: Secondary | ICD-10-CM | POA: Diagnosis not present

## 2018-01-21 DIAGNOSIS — F339 Major depressive disorder, recurrent, unspecified: Secondary | ICD-10-CM | POA: Diagnosis not present

## 2018-01-21 DIAGNOSIS — Z1211 Encounter for screening for malignant neoplasm of colon: Secondary | ICD-10-CM | POA: Diagnosis not present

## 2018-01-21 DIAGNOSIS — I10 Essential (primary) hypertension: Secondary | ICD-10-CM

## 2018-01-21 DIAGNOSIS — Z0000 Encounter for general adult medical examination without abnormal findings: Secondary | ICD-10-CM | POA: Diagnosis not present

## 2018-01-21 MED ORDER — SUMATRIPTAN-NAPROXEN SODIUM 85-500 MG PO TABS: 1.0000 | ORAL_TABLET | ORAL | 2 refills | Status: DC | PRN

## 2018-01-21 MED ORDER — OMEPRAZOLE 20 MG PO CPDR: 20.0000 mg | DELAYED_RELEASE_CAPSULE | Freq: Every day | ORAL | 2 refills | Status: DC

## 2018-01-21 MED ORDER — ATORVASTATIN CALCIUM 20 MG PO TABS: 20.0000 mg | ORAL_TABLET | Freq: Every day | ORAL | 2 refills | Status: DC

## 2018-01-21 MED ORDER — LOSARTAN POTASSIUM 50 MG PO TABS: 50.0000 mg | ORAL_TABLET | Freq: Every day | ORAL | 3 refills | Status: DC

## 2018-01-21 MED ORDER — TOPIRAMATE 50 MG PO TABS: 50.0000 mg | ORAL_TABLET | Freq: Two times a day (BID) | ORAL | 0 refills | Status: DC

## 2018-01-21 MED ORDER — GABAPENTIN 300 MG PO CAPS: 300.0000 mg | ORAL_CAPSULE | Freq: Two times a day (BID) | ORAL | 1 refills | Status: DC

## 2018-01-21 MED ORDER — SERTRALINE HCL 50 MG PO TABS: 50.0000 mg | ORAL_TABLET | Freq: Every day | ORAL | 0 refills | Status: DC

## 2018-01-21 NOTE — Patient Instructions (Signed)
Preventive Care for Adults, Female  A healthy lifestyle and preventive care can promote health and wellness. Preventive health guidelines for women include the following key practices.   A routine yearly physical is a good way to check with your health care provider about your health and preventive screening. It is a chance to share any concerns and updates on your health and to receive a thorough exam.   Visit your dentist for a routine exam and preventive care every 6 months. Brush your teeth twice a day and floss once a day. Good oral hygiene prevents tooth decay and gum disease.   The frequency of eye exams is based on your age, health, family medical history, use of contact lenses, and other factors. Follow your health care provider's recommendations for frequency of eye exams.   Eat a healthy diet. Foods like vegetables, fruits, whole grains, low-fat dairy products, and lean protein foods contain the nutrients you need without too many calories. Decrease your intake of foods high in solid fats, added sugars, and salt. Eat the right amount of calories for you.Get information about a proper diet from your health care provider, if necessary.   Regular physical exercise is one of the most important things you can do for your health. Most adults should get at least 150 minutes of moderate-intensity exercise (any activity that increases your heart rate and causes you to sweat) each week. In addition, most adults need muscle-strengthening exercises on 2 or more days a week.   Maintain a healthy weight. The body mass index (BMI) is a screening tool to identify possible weight problems. It provides an estimate of body fat based on height and weight. Your health care provider can find your BMI, and can help you achieve or maintain a healthy weight.For adults 20 years and older:   - A BMI below 18.5 is considered underweight.   - A BMI of 18.5 to 24.9 is normal.   - A BMI of 25 to 29.9 is  considered overweight.   - A BMI of 30 and above is considered obese.   Maintain normal blood lipids and cholesterol levels by exercising and minimizing your intake of trans and saturated fats.  Eat a balanced diet with plenty of fruit and vegetables. Blood tests for lipids and cholesterol should begin at age 20 and be repeated every 5 years minimum.  If your lipid or cholesterol levels are high, you are over 40, or you are at high risk for heart disease, you may need your cholesterol levels checked more frequently.Ongoing high lipid and cholesterol levels should be treated with medicines if diet and exercise are not working.   If you smoke, find out from your health care provider how to quit. If you do not use tobacco, do not start.   Lung cancer screening is recommended for adults aged 55-80 years who are at high risk for developing lung cancer because of a history of smoking. A yearly low-dose CT scan of the lungs is recommended for people who have at least a 30-pack-year history of smoking and are a current smoker or have quit within the past 15 years. A pack year of smoking is smoking an average of 1 pack of cigarettes a day for 1 year (for example: 1 pack a day for 30 years or 2 packs a day for 15 years). Yearly screening should continue until the smoker has stopped smoking for at least 15 years. Yearly screening should be stopped for people who develop a   health problem that would prevent them from having lung cancer treatment.   If you are pregnant, do not drink alcohol. If you are breastfeeding, be very cautious about drinking alcohol. If you are not pregnant and choose to drink alcohol, do not have more than 1 drink per day. One drink is considered to be 12 ounces (355 mL) of beer, 5 ounces (148 mL) of wine, or 1.5 ounces (44 mL) of liquor.   Avoid use of street drugs. Do not share needles with anyone. Ask for help if you need support or instructions about stopping the use of  drugs.   High blood pressure causes heart disease and increases the risk of stroke. Your blood pressure should be checked at least yearly.  Ongoing high blood pressure should be treated with medicines if weight loss and exercise do not work.   If you are 31-76 years old, ask your health care provider if you should take aspirin to prevent strokes.   Diabetes screening involves taking a blood sample to check your fasting blood sugar level. This should be done once every 3 years, after age 34, if you are within normal weight and without risk factors for diabetes. Testing should be considered at a younger age or be carried out more frequently if you are overweight and have at least 1 risk factor for diabetes.   Breast cancer screening is essential preventive care for women. You should practice "breast self-awareness."  This means understanding the normal appearance and feel of your breasts and may include breast self-examination.  Any changes detected, no matter how small, should be reported to a health care provider.  Women in their 60s and 30s should have a clinical breast exam (CBE) by a health care provider as part of a regular health exam every 1 to 3 years.  After age 63, women should have a CBE every year.  Starting at age 29, women should consider having a mammogram (breast X-ray test) every year.  Women who have a family history of breast cancer should talk to their health care provider about genetic screening.  Women at a high risk of breast cancer should talk to their health care providers about having an MRI and a mammogram every year.   -Breast cancer gene (BRCA)-related cancer risk assessment is recommended for women who have family members with BRCA-related cancers. BRCA-related cancers include breast, ovarian, tubal, and peritoneal cancers. Having family members with these cancers may be associated with an increased risk for harmful changes (mutations) in the breast cancer genes BRCA1 and  BRCA2. Results of the assessment will determine the need for genetic counseling and BRCA1 and BRCA2 testing.   The Pap test is a screening test for cervical cancer. A Pap test can show cell changes on the cervix that might become cervical cancer if left untreated. A Pap test is a procedure in which cells are obtained and examined from the lower end of the uterus (cervix).   - Women should have a Pap test starting at age 61.   - Between ages 59 and 27, Pap tests should be repeated every 2 years.   - Beginning at age 50, you should have a Pap test every 3 years as long as the past 3 Pap tests have been normal.   - Some women have medical problems that increase the chance of getting cervical cancer. Talk to your health care provider about these problems. It is especially important to talk to your health care provider if a  new problem develops soon after your last Pap test. In these cases, your health care provider may recommend more frequent screening and Pap tests.   - The above recommendations are the same for women who have or have not gotten the vaccine for human papillomavirus (HPV).   - If you had a hysterectomy for a problem that was not cancer or a condition that could lead to cancer, then you no longer need Pap tests. Even if you no longer need a Pap test, a regular exam is a good idea to make sure no other problems are starting.   - If you are between ages 36 and 66 years, and you have had normal Pap tests going back 10 years, you no longer need Pap tests. Even if you no longer need a Pap test, a regular exam is a good idea to make sure no other problems are starting.   - If you have had past treatment for cervical cancer or a condition that could lead to cancer, you need Pap tests and screening for cancer for at least 20 years after your treatment.   - If Pap tests have been discontinued, risk factors (such as a new sexual partner) need to be reassessed to determine if screening should  be resumed.   - The HPV test is an additional test that may be used for cervical cancer screening. The HPV test looks for the virus that can cause the cell changes on the cervix. The cells collected during the Pap test can be tested for HPV. The HPV test could be used to screen women aged 70 years and older, and should be used in women of any age who have unclear Pap test results. After the age of 67, women should have HPV testing at the same frequency as a Pap test.   Colorectal cancer can be detected and often prevented. Most routine colorectal cancer screening begins at the age of 57 years and continues through age 26 years. However, your health care provider may recommend screening at an earlier age if you have risk factors for colon cancer. On a yearly basis, your health care provider may provide home test kits to check for hidden blood in the stool.  Use of a small camera at the end of a tube, to directly examine the colon (sigmoidoscopy or colonoscopy), can detect the earliest forms of colorectal cancer. Talk to your health care provider about this at age 23, when routine screening begins. Direct exam of the colon should be repeated every 5 -10 years through age 49 years, unless early forms of pre-cancerous polyps or small growths are found.   People who are at an increased risk for hepatitis B should be screened for this virus. You are considered at high risk for hepatitis B if:  -You were born in a country where hepatitis B occurs often. Talk with your health care provider about which countries are considered high risk.  - Your parents were born in a high-risk country and you have not received a shot to protect against hepatitis B (hepatitis B vaccine).  - You have HIV or AIDS.  - You use needles to inject street drugs.  - You live with, or have sex with, someone who has Hepatitis B.  - You get hemodialysis treatment.  - You take certain medicines for conditions like cancer, organ  transplantation, and autoimmune conditions.   Hepatitis C blood testing is recommended for all people born from [birth year] through 1965 and any individual  with known risks for hepatitis C.   Practice safe sex. Use condoms and avoid high-risk sexual practices to reduce the spread of sexually transmitted infections (STIs). STIs include gonorrhea, chlamydia, syphilis, trichomonas, herpes, HPV, and human immunodeficiency virus (HIV). Herpes, HIV, and HPV are viral illnesses that have no cure. They can result in disability, cancer, and death. Sexually active women aged 50 years and younger should be checked for chlamydia. Older women with new or multiple partners should also be tested for chlamydia. Testing for other STIs is recommended if you are sexually active and at increased risk.   Osteoporosis is a disease in which the bones lose minerals and strength with aging. This can result in serious bone fractures or breaks. The risk of osteoporosis can be identified using a bone density scan. Women ages 72 years and over and women at risk for fractures or osteoporosis should discuss screening with their health care providers. Ask your health care provider whether you should take a calcium supplement or vitamin D to There are also several preventive steps women can take to avoid osteoporosis and resulting fractures or to keep osteoporosis from worsening. -->Recommendations include:  Eat a balanced diet high in fruits, vegetables, calcium, and vitamins.  Get enough calcium. The recommended total intake of is 1,200 mg daily; for best absorption, if taking supplements, divide doses into 250-500 mg doses throughout the day. Of the two types of calcium, calcium carbonate is best absorbed when taken with food but calcium citrate can be taken on an empty stomach.  Get enough vitamin D. [employer] and the [employer] recommend at least 1,000 IU per day for women age 70 and over who are at risk of vitamin D  deficiency. Vitamin D deficiency can be caused by inadequate sun exposure (for example, those who live in [place]).  Avoid alcohol and smoking. Heavy alcohol intake (more than 7 drinks per week) increases the risk of falls and hip fracture and women smokers tend to lose bone more rapidly and have lower bone mass than nonsmokers. Stopping smoking is one of the most important changes women can make to improve their health and decrease risk for disease.  Be physically active every day. Weight-bearing exercise (for example, fast walking, hiking, jogging, and weight training) may strengthen bones or slow the rate of bone loss that comes with aging. Balancing and muscle-strengthening exercises can reduce the risk of falling and fracture.  Consider therapeutic medications. Currently, several types of effective drugs are available. Healthcare providers can recommend the type most appropriate for each woman.  Eliminate environmental factors that may contribute to accidents. Falls cause nearly 90% of all osteoporotic fractures, so reducing this risk is an important bone-health strategy. Measures include ample lighting, removing obstructions to walking, using nonskid rugs on floors, and placing mats and/or grab bars in showers.  Be aware of medication side effects. Some common medicines make bones weaker. These include a type of steroid drug called glucocorticoids used for arthritis and asthma, some antiseizure drugs, certain sleeping pills, treatments for endometriosis, and some cancer drugs. An overactive thyroid gland or using too much thyroid hormone for an underactive thyroid can also be a problem. If you are taking these medicines, talk to your doctor about what you can do to help protect your bones.reduce the rate of osteoporosis.    Menopause can be associated with physical symptoms and risks. Hormone replacement therapy is available to decrease symptoms and risks. You should talk to your  health care provider  about whether hormone replacement therapy is right for you.   Use sunscreen. Apply sunscreen liberally and repeatedly throughout the day. You should seek shade when your shadow is shorter than you. Protect yourself by wearing long sleeves, pants, a wide-brimmed hat, and sunglasses year round, whenever you are outdoors.   Once a month, do a whole body skin exam, using a mirror to look at the skin on your back. Tell your health care provider of new moles, moles that have irregular borders, moles that are larger than a pencil eraser, or moles that have changed in shape or color.   -Stay current with required vaccines (immunizations).   Influenza vaccine. All adults should be immunized every year.  Tetanus, diphtheria, and acellular pertussis (Td, Tdap) vaccine. Pregnant women should receive 1 dose of Tdap vaccine during each pregnancy. The dose should be obtained regardless of the length of time since the last dose. Immunization is preferred during the 27th 36th week of gestation. An adult who has not previously received Tdap or who does not know her vaccine status should receive 1 dose of Tdap. This initial dose should be followed by tetanus and diphtheria toxoids (Td) booster doses every 10 years. Adults with an unknown or incomplete history of completing a 3-dose immunization series with Td-containing vaccines should begin or complete a primary immunization series including a Tdap dose. Adults should receive a Td booster every 10 years.  Varicella vaccine. An adult without evidence of immunity to varicella should receive 2 doses or a second dose if she has previously received 1 dose. Pregnant females who do not have evidence of immunity should receive the first dose after pregnancy. This first dose should be obtained before leaving the health care facility. The second dose should be obtained 4 8 weeks after the first dose.  Human papillomavirus (HPV) vaccine. Females aged 60 26  years who have not received the vaccine previously should obtain the 3-dose series. The vaccine is not recommended for use in pregnant females. However, pregnancy testing is not needed before receiving a dose. If a female is found to be pregnant after receiving a dose, no treatment is needed. In that case, the remaining doses should be delayed until after the pregnancy. Immunization is recommended for any person with an immunocompromised condition through the age of 8 years if she did not get any or all doses earlier. During the 3-dose series, the second dose should be obtained 4 8 weeks after the first dose. The third dose should be obtained 24 weeks after the first dose and 16 weeks after the second dose.  Zoster vaccine. One dose is recommended for adults aged 78 years or older unless certain conditions are present.  Measles, mumps, and rubella (MMR) vaccine. Adults born before 35 generally are considered immune to measles and mumps. Adults born in 50 or later should have 1 or more doses of MMR vaccine unless there is a contraindication to the vaccine or there is laboratory evidence of immunity to each of the three diseases. A routine second dose of MMR vaccine should be obtained at least 28 days after the first dose for students attending postsecondary schools, health care workers, or international travelers. People who received inactivated measles vaccine or an unknown type of measles vaccine during 1963 1967 should receive 2 doses of MMR vaccine. People who received inactivated mumps vaccine or an unknown type of mumps vaccine before 1979 and are at high risk for mumps infection should consider immunization with 2 doses of  MMR vaccine. For females of childbearing age, rubella immunity should be determined. If there is no evidence of immunity, females who are not pregnant should be vaccinated. If there is no evidence of immunity, females who are pregnant should delay immunization until after pregnancy.  Unvaccinated health care workers born before 35 who lack laboratory evidence of measles, mumps, or rubella immunity or laboratory confirmation of disease should consider measles and mumps immunization with 2 doses of MMR vaccine or rubella immunization with 1 dose of MMR vaccine.  Pneumococcal 13-valent conjugate (PCV13) vaccine. When indicated, a person who is uncertain of her immunization history and has no record of immunization should receive the PCV13 vaccine. An adult aged 58 years or older who has certain medical conditions and has not been previously immunized should receive 1 dose of PCV13 vaccine. This PCV13 should be followed with a dose of pneumococcal polysaccharide (PPSV23) vaccine. The PPSV23 vaccine dose should be obtained at least 8 weeks after the dose of PCV13 vaccine. An adult aged 63 years or older who has certain medical conditions and previously received 1 or more doses of PPSV23 vaccine should receive 1 dose of PCV13. The PCV13 vaccine dose should be obtained 1 or more years after the last PPSV23 vaccine dose.  Pneumococcal polysaccharide (PPSV23) vaccine. When PCV13 is also indicated, PCV13 should be obtained first. All adults aged 26 years and older should be immunized. An adult younger than age 44 years who has certain medical conditions should be immunized. Any person who resides in a nursing home or long-term care facility should be immunized. An adult smoker should be immunized. People with an immunocompromised condition and certain other conditions should receive both PCV13 and PPSV23 vaccines. People with human immunodeficiency virus (HIV) infection should be immunized as soon as possible after diagnosis. Immunization during chemotherapy or radiation therapy should be avoided. Routine use of PPSV23 vaccine is not recommended for American Indians, [place] Natives, or people younger than 65 years unless there are medical conditions that require PPSV23 vaccine. When indicated,  people who have unknown immunization and have no record of immunization should receive PPSV23 vaccine. One-time revaccination 5 years after the first dose of PPSV23 is recommended for people aged 69 64 years who have chronic kidney failure, nephrotic syndrome, asplenia, or immunocompromised conditions. People who received 1 2 doses of PPSV23 before age 31 years should receive another dose of PPSV23 vaccine at age 33 years or later if at least 5 years have passed since the previous dose. Doses of PPSV23 are not needed for people immunized with PPSV23 at or after age 73 years.  Meningococcal vaccine. Adults with asplenia or persistent complement component deficiencies should receive 2 doses of quadrivalent meningococcal conjugate (MenACWY-D) vaccine. The doses should be obtained at least 2 months apart. Microbiologists working with certain meningococcal bacteria, [place] recruits, people at risk during an outbreak, and people who travel to or live in countries with a high rate of meningitis should be immunized. A first-year college student up through age 88 years who is living in a residence hall should receive a dose if she did not receive a dose on or after her 16th birthday. Adults who have certain high-risk conditions should receive one or more doses of vaccine.  Hepatitis A vaccine. Adults who wish to be protected from this disease, have certain high-risk conditions, work with hepatitis A-infected animals, work in hepatitis A research labs, or travel to or work in countries with a high rate of hepatitis A should be  immunized. Adults who were previously unvaccinated and who anticipate close contact with an international adoptee during the first 60 days after arrival in the [place] from a country with a high rate of hepatitis A should be immunized.  Hepatitis B vaccine.  Adults who wish to be protected from this disease, have certain high-risk conditions, may be exposed to blood or other infectious  body fluids, are household contacts or sex partners of hepatitis B positive people, are clients or workers in certain care facilities, or travel to or work in countries with a high rate of hepatitis B should be immunized.  Haemophilus influenzae type b (Hib) vaccine. A previously unvaccinated person with asplenia or sickle cell disease or having a scheduled splenectomy should receive 1 dose of Hib vaccine. Regardless of previous immunization, a recipient of a hematopoietic stem cell transplant should receive a 3-dose series 6 12 months after her successful transplant. Hib vaccine is not recommended for adults with HIV infection.  Preventive Services / Frequency Ages 6 to 39years  Blood pressure check.** / Every 1 to 2 years.  Lipid and cholesterol check.** / Every 5 years beginning at age 39.  Clinical breast exam.** / Every 3 years for women in their 61s and 62s.  BRCA-related cancer risk assessment.** / For women who have family members with a BRCA-related cancer (breast, ovarian, tubal, or peritoneal cancers).  Pap test.** / Every 2 years from ages 47 through 85. Every 3 years starting at age 34 through age 12 or 74 with a history of 3 consecutive normal Pap tests.  HPV screening.** / Every 3 years from ages 46 through ages 43 to 54 with a history of 3 consecutive normal Pap tests.  Hepatitis C blood test.** / For any individual with known risks for hepatitis C.  Skin self-exam. / Monthly.  Influenza vaccine. / Every year.  Tetanus, diphtheria, and acellular pertussis (Tdap, Td) vaccine.** / Consult your health care provider. Pregnant women should receive 1 dose of Tdap vaccine during each pregnancy. 1 dose of Td every 10 years.  Varicella vaccine.** / Consult your health care provider. Pregnant females who do not have evidence of immunity should receive the first dose after pregnancy.  HPV vaccine. / 3 doses over 6 months, if 64 and younger. The vaccine is not recommended for use in  pregnant females. However, pregnancy testing is not needed before receiving a dose.  Measles, mumps, rubella (MMR) vaccine.** / You need at least 1 dose of MMR if you were born in 1957 or later. You may also need a 2nd dose. For females of childbearing age, rubella immunity should be determined. If there is no evidence of immunity, females who are not pregnant should be vaccinated. If there is no evidence of immunity, females who are pregnant should delay immunization until after pregnancy.  Pneumococcal 13-valent conjugate (PCV13) vaccine.** / Consult your health care provider.  Pneumococcal polysaccharide (PPSV23) vaccine.** / 1 to 2 doses if you smoke cigarettes or if you have certain conditions.  Meningococcal vaccine.** / 1 dose if you are age 71 to 37 years and a [employer] researcher living in a residence hall, or have one of several medical conditions, you need to get vaccinated against meningococcal disease. You may also need additional booster doses.  Hepatitis A vaccine.** / Consult your health care provider.  Hepatitis B vaccine.** / Consult your health care provider.  Haemophilus influenzae type b (Hib) vaccine.** / Consult your health care provider.  Ages 55 to 64years  Blood pressure check.** / Every 1 to 2 years.  Lipid and cholesterol check.** / Every 5 years beginning at age 20 years.  Lung cancer screening. / Every year if you are aged 55 80 years and have a 30-pack-year history of smoking and currently smoke or have quit within the past 15 years. Yearly screening is stopped once you have quit smoking for at least 15 years or develop a health problem that would prevent you from having lung cancer treatment.  Clinical breast exam.** / Every year after age 40 years.  BRCA-related cancer risk assessment.** / For women who have family members with a BRCA-related cancer (breast, ovarian, tubal, or peritoneal cancers).  Mammogram.** / Every year beginning at age 40  years and continuing for as long as you are in good health. Consult with your health care provider.  Pap test.** / Every 3 years starting at age 30 years through age 65 or 70 years with a history of 3 consecutive normal Pap tests.  HPV screening.** / Every 3 years from ages 30 years through ages 65 to 70 years with a history of 3 consecutive normal Pap tests.  Fecal occult blood test (FOBT) of stool. / Every year beginning at age 50 years and continuing until age 75 years. You may not need to do this test if you get a colonoscopy every 10 years.  Flexible sigmoidoscopy or colonoscopy.** / Every 5 years for a flexible sigmoidoscopy or every 10 years for a colonoscopy beginning at age 50 years and continuing until age 75 years.  Hepatitis C blood test.** / For all people born from 1945 through 1965 and any individual with known risks for hepatitis C.  Skin self-exam. / Monthly.  Influenza vaccine. / Every year.  Tetanus, diphtheria, and acellular pertussis (Tdap/Td) vaccine.** / Consult your health care provider. Pregnant women should receive 1 dose of Tdap vaccine during each pregnancy. 1 dose of Td every 10 years.  Varicella vaccine.** / Consult your health care provider. Pregnant females who do not have evidence of immunity should receive the first dose after pregnancy.  Zoster vaccine.** / 1 dose for adults aged 60 years or older.  Measles, mumps, rubella (MMR) vaccine.** / You need at least 1 dose of MMR if you were born in 1957 or later. You may also need a 2nd dose. For females of childbearing age, rubella immunity should be determined. If there is no evidence of immunity, females who are not pregnant should be vaccinated. If there is no evidence of immunity, females who are pregnant should delay immunization until after pregnancy.  Pneumococcal 13-valent conjugate (PCV13) vaccine.** / Consult your health care provider.  Pneumococcal polysaccharide (PPSV23) vaccine.** / 1 to 2 doses if  you smoke cigarettes or if you have certain conditions.  Meningococcal vaccine.** / Consult your health care provider.  Hepatitis A vaccine.** / Consult your health care provider.  Hepatitis B vaccine.** / Consult your health care provider.  Haemophilus influenzae type b (Hib) vaccine.** / Consult your health care provider.  Ages 65 years and over  Blood pressure check.** / Every 1 to 2 years.  Lipid and cholesterol check.** / Every 5 years beginning at age 20 years.  Lung cancer screening. / Every year if you are aged 55 80 years and have a 30-pack-year history of smoking and currently smoke or have quit within the past 15 years. Yearly screening is stopped once you have quit smoking for at least 15 years or develop a health problem that   would prevent you from having lung cancer treatment.  Clinical breast exam.** / Every year after age 103 years.  BRCA-related cancer risk assessment.** / For women who have family members with a BRCA-related cancer (breast, ovarian, tubal, or peritoneal cancers).  Mammogram.** / Every year beginning at age 36 years and continuing for as long as you are in good health. Consult with your health care provider.  Pap test.** / Every 3 years starting at age 5 years through age 85 or 10 years with 3 consecutive normal Pap tests. Testing can be stopped between 65 and 70 years with 3 consecutive normal Pap tests and no abnormal Pap or HPV tests in the past 10 years.  HPV screening.** / Every 3 years from ages 93 years through ages 70 or 45 years with a history of 3 consecutive normal Pap tests. Testing can be stopped between 65 and 70 years with 3 consecutive normal Pap tests and no abnormal Pap or HPV tests in the past 10 years.  Fecal occult blood test (FOBT) of stool. / Every year beginning at age 8 years and continuing until age 45 years. You may not need to do this test if you get a colonoscopy every 10 years.  Flexible sigmoidoscopy or colonoscopy.** /  Every 5 years for a flexible sigmoidoscopy or every 10 years for a colonoscopy beginning at age 69 years and continuing until age 68 years.  Hepatitis C blood test.** / For all people born from [birth year] through 1964 and any individual with known risks for hepatitis C.  Osteoporosis screening.** / A one-time screening for women ages 7 years and over and women at risk for fractures or osteoporosis.  Skin self-exam. / Monthly.  Influenza vaccine. / Every year.  Tetanus, diphtheria, and acellular pertussis (Tdap/Td) vaccine.** / 1 dose of Td every 10 years.  Varicella vaccine.** / Consult your health care provider.  Zoster vaccine.** / 1 dose for adults aged 5 years or older.  Pneumococcal 13-valent conjugate (PCV13) vaccine.** / Consult your health care provider.  Pneumococcal polysaccharide (PPSV23) vaccine.** / 1 dose for all adults aged 74 years and older.  Meningococcal vaccine.** / Consult your health care provider.  Hepatitis A vaccine.** / Consult your health care provider.  Hepatitis B vaccine.** / Consult your health care provider.  Haemophilus influenzae type b (Hib) vaccine.** / Consult your health care provider. ** Family history and personal history of risk and conditions may change your health care provider's recommendations. Document Released: 11/07/2001 Document Revised: 07/02/2013  [facility] Patient Information 2014 [place], [place].   EXERCISE AND DIET:  We recommended that you start or continue a regular exercise program for good health. Regular exercise means any activity that makes your heart beat faster and makes you sweat.  We recommend exercising at least 30 minutes per day at least 3 days a week, preferably 5.  We also recommend a diet low in fat and sugar / carbohydrates.  Inactivity, poor dietary choices and obesity can cause diabetes, heart attack, stroke, and kidney damage, among others.     ALCOHOL AND SMOKING:  Women should limit their alcohol intake to no  more than 7 drinks/beers/glasses of wine (combined, not each!) per week. Moderation of alcohol intake to this level decreases your risk of breast cancer and liver damage.  ( And of course, no recreational drugs are part of a healthy lifestyle.)  Also, you should not be smoking at all or even being exposed to second hand smoke. Most people know smoking can  cause cancer, and various heart and lung diseases, but did you know it also contributes to weakening of your bones?  Aging of your skin?  Yellowing of your teeth and nails?   CALCIUM AND VITAMIN D:  Adequate intake of calcium and Vitamin D are recommended.  The recommendations for exact amounts of these supplements seem to change often, but generally speaking 600 mg of calcium (either carbonate or citrate) and 800 units of Vitamin D per day seems prudent. Certain women may benefit from higher intake of Vitamin D.  If you are among these women, your doctor will have told you during your visit.     PAP SMEARS:  Pap smears, to check for cervical cancer or precancers,  have traditionally been done yearly, although recent scientific advances have shown that most women can have pap smears less often.  However, every woman still should have a physical exam from her gynecologist or primary care physician every year. It will include a breast check, inspection of the vulva and vagina to check for abnormal growths or skin changes, a visual exam of the cervix, and then an exam to evaluate the size and shape of the uterus and ovaries.  And after 71 years of age, a rectal exam is indicated to check for rectal cancers. We will also provide age appropriate advice regarding health maintenance, like when you should have certain vaccines, screening for sexually transmitted diseases, bone density testing, colonoscopy, mammograms, etc.    MAMMOGRAMS:  All women over 63 years old should have a yearly mammogram. Many facilities now offer a "3D" mammogram, which may cost  around $50 extra out of pocket. If possible,  we recommend you accept the option to have the 3D mammogram performed.  It both reduces the number of women who will be called back for extra views which then turn out to be normal, and it is better than the routine mammogram at detecting truly abnormal areas.     COLONOSCOPY:  Colonoscopy to screen for colon cancer is recommended for all women at age 2.  We know, you hate the idea of the prep.  We agree, BUT, having colon cancer and not knowing it is worse!!  Colon cancer so often starts as a polyp that can be seen and removed at colonscopy, which can quite literally save your life!  And if your first colonoscopy is normal and you have no family history of colon cancer, most women don't have to have it again for 10 years.  Once every ten years, you can do something that may end up saving your life, right?  We will be happy to help you get it scheduled when you are ready.  Be sure to check your insurance coverage so you understand how much it will cost.  It may be covered as a preventative service at no cost, but you should check your particular policy.    Bradycardia, Adult Bradycardia is a slower-than-normal heartbeat. A normal resting heart rate for an adult ranges from 60 to 100 beats per minute. With bradycardia, the resting heart rate is less than 60 beats per minute. Bradycardia can prevent enough oxygen from reaching certain areas of your body when you are active. It can be serious if it keeps enough oxygen from reaching your brain and other parts of your body. Bradycardia is not a problem for everyone. For some healthy adults, a slow resting heart rate is normal. What are the causes? This condition may be caused by:  A  problem with the heart, including: ? A problem with the heart's electrical system, such as a heart block. ? A problem with the heart's natural pacemaker (sinus node). ? Heart disease. ? A heart attack. ? Heart damage. ? A heart  infection. ? A heart condition that is present at birth (congenital heart defect).  Certain medicines that treat heart conditions.  Certain conditions, such as hypothyroidism and obstructive sleep apnea.  Problems with the balance of chemicals and other substances, like potassium, in the blood.  What increases the risk? This condition is more likely to develop in adults who:  Are age 44 or older.  Have high blood pressure (hypertension), high cholesterol (hyperlipidemia), or diabetes.  Drink heavily, use tobacco or nicotine products, or use drugs.  Are stressed.  What are the signs or symptoms? Symptoms of this condition include:  Light-headedness.  Feeling faint or fainting.  Fatigue and weakness.  Shortness of breath.  Chest pain (angina).  Drowsiness.  Confusion.  Dizziness.  How is this diagnosed? This condition may be diagnosed based on:  Your symptoms.  Your medical history.  A physical exam.  During the exam, your health care provider will listen to your heartbeat and check your pulse. To confirm the diagnosis, your health care provider may order tests, such as:  Blood tests.  An electrocardiogram (ECG). This test records the heart's electrical activity. The test can show how fast your heart is beating and whether the heartbeat is steady.  A test in which you wear a portable device (event recorder or Holter monitor) to record your heart's electrical activity while you go about your day.  Anexercise test.  How is this treated? Treatment for this condition depends on the cause of the condition and how severe your symptoms are. Treatment may involve:  Treatment of the underlying condition.  Changing your medicines or how much medicine you take.  Having a small, battery-operated device called a pacemaker implanted under the skin. When bradycardia occurs, this device can be used to increase your heart rate and help your heart to beat in a regular  rhythm.  Follow these instructions at home: Lifestyle   Manage any health conditions that contribute to bradycardia as told by your health care provider.  Follow a heart-healthy diet. A nutrition specialist (dietitian) can help to educate you about healthy food options and changes.  Follow an exercise program that is approved by your health care provider.  Maintain a healthy weight.  Try to reduce or manage your stress, such as with yoga or meditation. If you need help reducing stress, ask your health care provider.  Do not use use any products that contain nicotine or tobacco, such as cigarettes and e-cigarettes. If you need help quitting, ask your health care provider.  Do not use illegal drugs.  Limit alcohol intake to no more than 1 drink per day for nonpregnant women and 2 drinks per day for men. One drink equals 12 oz of beer, 5 oz of wine, or 1 oz of hard liquor. General instructions  Take over-the-counter and prescription medicines only as told by your health care provider.  Keep all follow-up visits as directed by your health care provider. This is important. How is this prevented? In some cases, bradycardia may be prevented by:  Treating underlying medical problems.  Stopping behaviors or medicines that can trigger the condition.  Contact a health care provider if:  You feel light-headed or dizzy.  You almost faint.  You feel weak or  are easily fatigued during physical activity.  You experience confusion or have memory problems. Get help right away if:  You faint.  You have an irregular heartbeat (palpitations).  You have chest pain.  You have trouble breathing. This information is not intended to replace advice given to you by your health care provider. Make sure you discuss any questions you have with your health care provider. Document Released: 06/03/2002 Document Revised: 05/09/2016 Document Reviewed: 03/02/2016 Elsevier Interactive Patient  Education  2017 [employer].  Please continue all medications as directed. Increase water intake and follow heart healthy diet. Referral to Neurology placed, re: increase in frequency of imbalance. Referral to Cardiology placed, re: low heart rate. Follow-up in 6 months- medicare wellness office visit with fasting labs. Congratulations on moving into your new place. NICE TO SEE YOU!

## 2018-01-21 NOTE — Assessment & Plan Note (Signed)
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 

## 2018-01-21 NOTE — Assessment & Plan Note (Signed)
Mood stable Denies thoughts of harming herself/others Continue Sertraline  QD

## 2018-01-21 NOTE — Assessment & Plan Note (Signed)
Please continue all medications as directed. Increase water intake and follow heart healthy diet. Referral to Neurology placed, re: increase in frequency of imbalance. Referral to Cardiology placed, re: low heart rate. Follow-up in 6 months- medicare wellness office visit with fasting labs.

## 2018-01-21 NOTE — Assessment & Plan Note (Signed)
Sx's present >2 years, however increasing in frequency- now 5days/week Referral to Neurology placed Bradycardia may be contributing to imbalance, referral to cards placed

## 2018-01-21 NOTE — Assessment & Plan Note (Addendum)
BP 143/82 HR 52 HR last two OVs- 61, 54 Currently on Losartan  QD Follow heart healthy diet and continue regular movement.

## 2018-01-27 DIAGNOSIS — Z1211 Encounter for screening for malignant neoplasm of colon: Secondary | ICD-10-CM | POA: Diagnosis not present

## 2018-01-28 LAB — COLOGUARD: Cologuard: POSITIVE

## 2018-02-11 ENCOUNTER — Telehealth: Payer: Self-pay

## 2018-02-11 DIAGNOSIS — R195 Other fecal abnormalities: Secondary | ICD-10-CM

## 2018-02-11 NOTE — Telephone Encounter (Signed)
Cologuard positive.  Per William Hamburger, referral for GI for colonoscopy placed.  Tiajuana Amass, CMA  LVM for pt to call to discuss.  Tiajuana Amass, CMA

## 2018-02-12 ENCOUNTER — Encounter: Payer: Self-pay | Admitting: Gastroenterology

## 2018-02-12 NOTE — Telephone Encounter (Signed)
Pt informed of results.  Pt expressed understanding and is agreeable.  T. Nelson, CMA 

## 2018-03-25 ENCOUNTER — Encounter: Payer: Self-pay | Admitting: Neurology

## 2018-03-25 ENCOUNTER — Ambulatory Visit (INDEPENDENT_AMBULATORY_CARE_PROVIDER_SITE_OTHER): Payer: Medicare Other | Admitting: Neurology

## 2018-03-25 ENCOUNTER — Telehealth: Payer: Self-pay | Admitting: Neurology

## 2018-03-25 ENCOUNTER — Encounter

## 2018-03-25 VITALS — BP 172/92 | HR 78 | Ht 68.0 in | Wt 226.8 lb

## 2018-03-25 DIAGNOSIS — M545 Low back pain, unspecified: Secondary | ICD-10-CM | POA: Insufficient documentation

## 2018-03-25 DIAGNOSIS — G3281 Cerebellar ataxia in diseases classified elsewhere: Secondary | ICD-10-CM | POA: Diagnosis not present

## 2018-03-25 DIAGNOSIS — M542 Cervicalgia: Secondary | ICD-10-CM | POA: Insufficient documentation

## 2018-03-25 DIAGNOSIS — R269 Unspecified abnormalities of gait and mobility: Secondary | ICD-10-CM | POA: Insufficient documentation

## 2018-03-25 DIAGNOSIS — R202 Paresthesia of skin: Secondary | ICD-10-CM | POA: Diagnosis not present

## 2018-03-25 MED ORDER — ALPRAZOLAM 0.5 MG PO TABS: 0.5000 mg | ORAL_TABLET | Freq: Every evening | ORAL | 0 refills | Status: DC | PRN

## 2018-03-25 NOTE — Telephone Encounter (Signed)
open mri  Medicare/tricare order faxed to [facility] they will reach out to the pt to schedule
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
## 2018-04-02 DIAGNOSIS — M5136 Other intervertebral disc degeneration, lumbar region: Secondary | ICD-10-CM | POA: Diagnosis not present

## 2018-04-02 DIAGNOSIS — M419 Scoliosis, unspecified: Secondary | ICD-10-CM | POA: Diagnosis not present

## 2018-04-02 DIAGNOSIS — M47816 Spondylosis without myelopathy or radiculopathy, lumbar region: Secondary | ICD-10-CM | POA: Diagnosis not present

## 2018-04-02 DIAGNOSIS — M47812 Spondylosis without myelopathy or radiculopathy, cervical region: Secondary | ICD-10-CM | POA: Diagnosis not present

## 2018-04-05 ENCOUNTER — Telehealth: Payer: Self-pay | Admitting: Neurology

## 2018-04-05 NOTE — Telephone Encounter (Signed)
Spoke to patient to review results below.  She verbalized understanding and will keep her pending follow up on 05/02/18 for further discussion with Dr. Terrace ArabiaYan.

## 2018-04-05 NOTE — Telephone Encounter (Addendum)
Please call patient, MRI of the lumbar on April 02, 2018 showed multifactorial moderate to severe right and moderate right foraminal stenosis at L4-5, L5-S1, degenerative disc disease most pronounced at L 2-3, additional lumbar spondylosis, dextroscoliotic curvature centered at L2  MRI of cervical spine showed multilevel degenerative changes, there is no spinal stenosis, no spinal cord abnormality

## 2018-04-10 ENCOUNTER — Encounter: Payer: Self-pay | Admitting: Gastroenterology

## 2018-04-10 ENCOUNTER — Ambulatory Visit (INDEPENDENT_AMBULATORY_CARE_PROVIDER_SITE_OTHER): Payer: Medicare Other | Admitting: Gastroenterology

## 2018-04-10 VITALS — BP 156/84 | HR 88 | Ht 68.0 in | Wt 228.4 lb

## 2018-04-10 DIAGNOSIS — R195 Other fecal abnormalities: Secondary | ICD-10-CM

## 2018-04-10 MED ORDER — PEG 3350-KCL-NA BICARB-NACL 420 G PO SOLR: 4000.0000 mL | ORAL | 0 refills | Status: DC

## 2018-04-10 NOTE — Patient Instructions (Addendum)
You will be set up for a colonoscopy in September for cologuard + stools. Please start taking citrucel (orange flavored) powder fiber supplement.  This may cause some bloating at first but that usually goes away. Begin with a small spoonful and work your way up to a large, heaping spoonful daily over a week.    Normal BMI (Body Mass Index- based on height and weight) is between 23 and 30. Your BMI today is Body mass index is 34.73 kg/m. Marland Kitchen. Please consider follow up  regarding your BMI with your Primary Care Provider.

## 2018-04-10 NOTE — Progress Notes (Signed)
HPI: This is a  very pleasant 72 year old woman who was referred to me by Julaine Fusianford, Katy D, NP  to evaluate positive Colo guard colon cancer screening test.    Chief complaint is positive Colo guard colon cancer screening test  Gassy for a long time. She will have intermttent constipation and diarrhea and with diarrhea can have red rectal bleeding.  She tried fiber and probiotics for the alternating bowels with limited success.  Fiber was on PRN basis.  Overall weight is up positive Colo guard colon cancer screening test,   Old Data Reviewed:  Cologuard colon cancer screening test was +May 2019   Review of systems: Pertinent positive and negative review of systems were noted in the above HPI section. All other review negative.   Past Medical History:  Diagnosis Date  . Arthritis   . Chronic headaches   . Congestive heart failure (CHF) (HCC)   . Depression   . GERD (gastroesophageal reflux disease)   . H/O blood clots    "back leg saddle block"  . Hyperlipidemia   . Hypertension   . Imbalance   . Neuropathy   . Obesity   . Sleep apnea     Past Surgical History:  Procedure Laterality Date  . ABDOMINAL HYSTERECTOMY    . leg reduction      Current Outpatient Medications  Medication Sig Dispense Refill  . ALPRAZolam (XANAX) 0.5 MG tablet Take 1 tablet (0.5 mg total) by mouth at bedtime as needed for anxiety. (Patient taking differently: Take 0.5 mg by mouth at bedtime as needed for anxiety (for flying and MRI's). ) 3 tablet 0  . atorvastatin (LIPITOR) 20 MG tablet Take 1 tablet (20 mg total) by mouth daily. 90 tablet 2  . gabapentin (NEURONTIN) 300 MG capsule Take 1 capsule (300 mg total) by mouth 2 (two) times daily. 180 capsule 1  . losartan (COZAAR) 50 MG tablet Take 1 tablet (50 mg total) by mouth daily. 90 tablet 3  . omeprazole (PRILOSEC) 20 MG capsule Take 1 capsule (20 mg total) by mouth daily. 90 capsule 2  . ondansetron (ZOFRAN-ODT) 8 MG disintegrating tablet  Take 1 tablet (8 mg total) by mouth every 8 (eight) hours as needed for nausea or vomiting. 20 tablet 0  . sertraline (ZOLOFT) 50 MG tablet Take 1 tablet (50 mg total) by mouth daily. 90 tablet 0  . SUMAtriptan-naproxen (TREXIMET) 85-500 MG tablet Take 1 tablet by mouth every 2 (two) hours as needed for migraine. 10 tablet 2  . topiramate (TOPAMAX) 50 MG tablet Take 1 tablet (50 mg total) by mouth 2 (two) times daily. 180 tablet 0   No current facility-administered medications for this visit.     Allergies as of 04/10/2018 - Review Complete 04/10/2018  Allergen Reaction Noted  . Contrast media [iodinated diagnostic agents]  04/10/2018  . Dye fdc red [red dye]  02/01/2017  . Latex Hives 02/01/2017  . Other  02/01/2017  . Tuna oil [fish oil]  02/01/2017    Family History  Problem Relation Age of Onset  . Diabetes Mother   . Hypertension Mother   . Hyperlipidemia Mother   . Parkinson's disease Mother   . Alcohol abuse Father   . Hyperlipidemia Sister   . Hyperlipidemia Brother   . Hypertension Brother   . Parkinson's disease Maternal Grandmother   . Diabetes Maternal Grandmother   . Colon cancer Maternal Uncle   . Hypertension Brother   . Irritable bowel syndrome Child  Social History   Socioeconomic History  . Marital status: Widowed    Spouse name: Not on file  . Number of children: 2  . Years of education: some college  . Highest education level: Not on file  Occupational History  . Occupation: Retired  Engineer, production  . Financial resource strain: Not on file  . Food insecurity:    Worry: Not on file    Inability: Not on file  . Transportation needs:    Medical: Not on file    Non-medical: Not on file  Tobacco Use  . Smoking status: Never Smoker  . Smokeless tobacco: Never Used  Substance and Sexual Activity  . Alcohol use: No  . Drug use: No  . Sexual activity: Not on file  Lifestyle  . Physical activity:    Days per week: Not on file    Minutes per  session: Not on file  . Stress: Not on file  Relationships  . Social connections:    Talks on phone: Not on file    Gets together: Not on file    Attends religious service: Not on file    Active member of club or organization: Not on file    Attends meetings of clubs or organizations: Not on file    Relationship status: Not on file  . Intimate partner violence:    Fear of current or ex partner: Not on file    Emotionally abused: Not on file    Physically abused: Not on file    Forced sexual activity: Not on file  Other Topics Concern  . Not on file  Social History Narrative   Lives alone.   Right-handed.   No caffeine use.     Physical Exam: BP (!) 156/84   Pulse 88   Ht 5\' 8"  (1.727 m)   Wt 228 lb 6.4 oz (103.6 kg)   BMI 34.73 kg/m  Constitutional: generally well-appearing Psychiatric: alert and oriented x3 Eyes: extraocular movements intact Mouth: oral pharynx moist, no lesions Neck: supple no lymphadenopathy Cardiovascular: heart regular rate and rhythm Lungs: clear to auscultation bilaterally Abdomen: soft, nontender, nondistended, no obvious ascites, no peritoneal signs, normal bowel sounds Extremities: no lower extremity edema bilaterally Skin: no lesions on visible extremities   Assessment and plan: 72 y.o. female with positive Colo guard colon cancer screening test, failed colonoscopy in [place] greater than 10 years ago, intermittent red rectal blood  With her positive Cologuard colon cancer screening test I recommended a repeat colonoscopy.  Her last one was in [place] and she told me that it was incomplete due to a very floppy colon.  This was greater than 10 years ago.  I explained to her that in my experience I am still able to complete a colonoscopy even with reports such as that from previous especially remote colonoscopy such as hers.  She wants to wait until September after some vacation time with her grandson in a couple of other already planned  events and that seems certainly reasonable to me.  I see no reason for any further blood tests or imaging studies prior to then.  She has alternating chronic constipation with diarrhea.  She has never tried fiber supplements on a daily basis and she will try that now as it usually seems to help alternating bowel habits like hers.    Please see the "Patient Instructions" section for addition details about the plan.   Rob Bunting, MD [facility] 04/10/2018, 11:38 AM  Cc: William Hamburger  D, NP

## 2018-04-22 ENCOUNTER — Other Ambulatory Visit: Payer: Self-pay | Admitting: Adult Health

## 2018-04-25 ENCOUNTER — Encounter: Payer: Self-pay | Admitting: Cardiology

## 2018-04-25 ENCOUNTER — Ambulatory Visit (INDEPENDENT_AMBULATORY_CARE_PROVIDER_SITE_OTHER): Payer: Medicare Other | Admitting: Cardiology

## 2018-04-25 ENCOUNTER — Encounter (INDEPENDENT_AMBULATORY_CARE_PROVIDER_SITE_OTHER): Payer: Self-pay

## 2018-04-25 VITALS — BP 162/100 | HR 55 | Ht 68.0 in | Wt 227.2 lb

## 2018-04-25 DIAGNOSIS — R001 Bradycardia, unspecified: Secondary | ICD-10-CM

## 2018-04-25 DIAGNOSIS — I10 Essential (primary) hypertension: Secondary | ICD-10-CM

## 2018-04-25 MED ORDER — LOSARTAN POTASSIUM 100 MG PO TABS: 100.0000 mg | ORAL_TABLET | Freq: Every day | ORAL | 3 refills | Status: DC

## 2018-04-25 NOTE — Progress Notes (Signed)
Cardiology Office Note    Date:  04/25/2018   ID:  [name], DOB 1946/01/12, MRN [id number]  PCP:  Julaine Fusi, NP  Cardiologist:  Armanda Magic, MD   Chief Complaint  Patient presents with  . New Patient (Initial Visit)    Bradycardia    History of Present Illness:  [name] is a 72 y.o. female who is being seen today for the evaluation of bradycardia at the request of Danford, Jinny Blossom, NP.  This is a very pleasant 71 year old female with a history of CHF, depression, GERD, hyperlipidemia and hypertension who is referred for evaluation of bradycardia.  Her heart rate has been in the 50-60 range when document of by her nurse practitioner.  Her mother who is age 50 at this time apparently has had a cardiac history in the past CAD and prior stenting.  She therefore was referred for further evaluation.  She denies any chest pain or pressure, PND, orthopnea, dizziness or syncope.  Occasionally she will have some lower extremity edema as well as shortness of breath which she attributes to being sedentary.  She does walk 1 mile daily for exercise without any problems.  Rarely she will notice a skipped heartbeat she is compliant with he meds and is tolerating meds with no SE.    Past Medical History:  Diagnosis Date  . Arthritis   . Chronic headaches   . Congestive heart failure (CHF) (HCC)   . Depression   . GERD (gastroesophageal reflux disease)   . H/O blood clots    "back leg saddle block"  . Hyperlipidemia   . Hypertension   . Imbalance   . Neuropathy   . Obesity   . Sleep apnea     Past Surgical History:  Procedure Laterality Date  . ABDOMINAL HYSTERECTOMY    . leg reduction      Current Medications: Current Meds  Medication Sig  . ALPRAZolam (XANAX) 0.5 MG tablet Take 1 tablet (0.5 mg total) by mouth at bedtime as needed for anxiety. (Patient taking differently: Take 0.5 mg by mouth at bedtime as needed for anxiety (for flying and MRI's). )  . atorvastatin  (LIPITOR) 20 MG tablet Take 1 tablet (20 mg total) by mouth daily.  Marland Kitchen gabapentin (NEURONTIN) 300 MG capsule Take 1 capsule (300 mg total) by mouth 2 (two) times daily.  Marland Kitchen losartan (COZAAR) 50 MG tablet Take 1 tablet (50 mg total) by mouth daily.  Marland Kitchen omeprazole (PRILOSEC) 20 MG capsule Take 1 capsule (20 mg total) by mouth daily.  . ondansetron (ZOFRAN-ODT) 8 MG disintegrating tablet Take 1 tablet (8 mg total) by mouth every 8 (eight) hours as needed for nausea or vomiting.  . polyethylene glycol-electrolytes (NULYTELY/GOLYTELY) 420 g solution Take 4,000 mLs by mouth as directed.  . sertraline (ZOLOFT) 50 MG tablet TAKE 1 TABLET BY MOUTH DAILY.  . SUMAtriptan-naproxen (TREXIMET) 85-500 MG tablet Take 1 tablet by mouth every 2 (two) hours as needed for migraine.  . topiramate (TOPAMAX) 50 MG tablet TAKE 1 TABLET BY MOUTH 2 TIMES DAILY.    Allergies:   Contrast media [iodinated diagnostic agents]; Dye fdc red [red dye]; Latex; Other; and Tuna oil [fish oil]   Social History   Socioeconomic History  . Marital status: Widowed    Spouse name: Not on file  . Number of children: 2  . Years of education: some college  . Highest education level: Not on file  Occupational History  . Occupation: Retired  Social Needs  . Financial resource strain: Not on file  . Food insecurity:    Worry: Not on file    Inability: Not on file  . Transportation needs:    Medical: Not on file    Non-medical: Not on file  Tobacco Use  . Smoking status: Never Smoker  . Smokeless tobacco: Never Used  Substance and Sexual Activity  . Alcohol use: No  . Drug use: No  . Sexual activity: Not on file  Lifestyle  . Physical activity:    Days per week: Not on file    Minutes per session: Not on file  . Stress: Not on file  Relationships  . Social connections:    Talks on phone: Not on file    Gets together: Not on file    Attends religious service: Not on file    Active member of club or organization: Not on  file    Attends meetings of clubs or organizations: Not on file    Relationship status: Not on file  Other Topics Concern  . Not on file  Social History Narrative   Lives alone.   Right-handed.   No caffeine use.     Family History:  The patient's family history includes Alcohol abuse in her father; Colon cancer in her maternal uncle; Diabetes in her maternal grandmother and mother; Hyperlipidemia in her brother, mother, and sister; Hypertension in her brother, brother, and mother; Irritable bowel syndrome in her child; Parkinson's disease in her maternal grandmother and mother.   ROS:   Please see the history of present illness.    ROS All other systems reviewed and are negative.  No flowsheet data found.     PHYSICAL EXAM:   VS:  BP (!) 162/100 (BP Location: Left Arm, Patient Position: Sitting, Cuff Size: Large)   Pulse (!) 55   Ht 5\' 8"  (1.727 m)   Wt 227 lb 3.2 oz (103.1 kg)   BMI 34.55 kg/m    GEN: Well nourished, well developed, in no acute distress  HEENT: normal  Neck: no JVD, carotid bruits, or masses Cardiac: RRR; no murmurs, rubs, or gallops,no edema.  Intact distal pulses bilaterally.  Respiratory:  clear to auscultation bilaterally, normal work of breathing GI: soft, nontender, nondistended, + BS MS: no deformity or atrophy  Skin: warm and dry, no rash Neuro:  Alert and Oriented x 3, Strength and sensation are intact Psych: euthymic mood, full affect  Wt Readings from Last 3 Encounters:  04/25/18 227 lb 3.2 oz (103.1 kg)  04/10/18 228 lb 6.4 oz (103.6 kg)  03/25/18 226 lb 12 oz (102.9 kg)      Studies/Labs Reviewed:   EKG:  EKG is ordered today.  The ekg ordered today demonstrates bradycardia 55 bpm with no ST-T wave at normalities incomplete right bundle branch block  Recent Labs: 07/12/2017: ALT 7; BUN 14; Creatinine, Ser 0.82; Hemoglobin 14.3; Platelets 295; Potassium 4.6; Sodium 144; TSH 4.950   Lipid Panel    Component Value Date/Time   CHOL  178 07/12/2017 0830   TRIG 147 07/12/2017 0830   HDL 52 07/12/2017 0830   CHOLHDL 3.4 07/12/2017 0830   LDLCALC 97 07/12/2017 0830    Additional studies/ records that were reviewed today include:  Office notes from PCP    ASSESSMENT:    1. Bradycardia   2. Essential hypertension      PLAN:  In order of problems listed above:  1. Asymptomatic bradycardia - she is not on  any AV nodal blocking agents or rate suppressing agents.  She says her rate has run low in the past.  I will get a 24-hour Holter monitor to assess for significant bradycardia.  I also recommended an exercise treadmill test to assess chronotropic competence as well as rule out ischemia related bradycardia.  She is completely asymptomatic.  If ETT and Holter monitor okay no further work-up recommended at this time.  If patient develops symptoms of exertional fatigue, dizziness or syncope and she should be referred back for evaluation.  2.  Hypertension -BP is poorly controlled on exam today.  Her blood pressure is 162/100 mmHg. I recommended increasing losartan to 100 mg daily.  Her creatinine was stable at 0.82 on 07/12/2017..  I will repeat a bmet in 1 week and  follow-up in hypertension clinic in 2 weeks.   Medication Adjustments/Labs and Tests Ordered: Current medicines are reviewed at length with the patient today.  Concerns regarding medicines are outlined above.  Medication changes, Labs and Tests ordered today are listed in the Patient Instructions below.  There are no Patient Instructions on file for this visit.   Signed, Armanda Magicraci Glennette Galster, MD  04/25/2018 2:44 PM    [facility] [address], [place], [place]  [address] Phone: [phone]; Fax: [phone]

## 2018-04-25 NOTE — Patient Instructions (Signed)
Medication Instructions:  Your physician has recommended you make the following change in your medication:  1.) increase losartan to 100 mg daily   Labwork: Your physician recommends that you return for lab work in: 1 week (BMET)   Testing/Procedures: Your physician has requested that you have an exercise tolerance test. For further information please visit [email or web address]. Please also follow instruction sheet, as given.  Your physician has recommended that you wear a holter monitor. Holter monitors are medical devices that record the heart's electrical activity. Doctors most often use these monitors to diagnose arrhythmias. Arrhythmias are problems with the speed or rhythm of the heartbeat. The monitor is a small, portable device. You can wear one while you do your normal daily activities. This is usually used to diagnose what is causing palpitations/syncope (passing out).    Follow-Up: Your physician recommends that you schedule a follow-up appointment as needed with Dr. Mayford Knifeurner   Any Other Special Instructions Will Be Listed Below (If Applicable).  You have been referred to Hypertension Clinic for a visit with a Pharmacist at our office. Please schedule an appointment.    If you need a refill on your cardiac medications before your next appointment, please call your pharmacy.

## 2018-05-02 ENCOUNTER — Other Ambulatory Visit: Payer: Medicare Other

## 2018-05-03 ENCOUNTER — Ambulatory Visit (INDEPENDENT_AMBULATORY_CARE_PROVIDER_SITE_OTHER): Payer: Medicare Other | Admitting: Neurology

## 2018-05-03 ENCOUNTER — Encounter (INDEPENDENT_AMBULATORY_CARE_PROVIDER_SITE_OTHER): Payer: Self-pay | Admitting: Neurology

## 2018-05-03 DIAGNOSIS — G3281 Cerebellar ataxia in diseases classified elsewhere: Secondary | ICD-10-CM

## 2018-05-03 DIAGNOSIS — M542 Cervicalgia: Secondary | ICD-10-CM

## 2018-05-03 DIAGNOSIS — M545 Low back pain, unspecified: Secondary | ICD-10-CM

## 2018-05-03 DIAGNOSIS — R269 Unspecified abnormalities of gait and mobility: Secondary | ICD-10-CM

## 2018-05-03 DIAGNOSIS — R202 Paresthesia of skin: Secondary | ICD-10-CM | POA: Diagnosis not present

## 2018-05-03 DIAGNOSIS — Z0289 Encounter for other administrative examinations: Secondary | ICD-10-CM

## 2018-05-03 MED ORDER — GABAPENTIN 100 MG PO CAPS: 100.0000 mg | ORAL_CAPSULE | Freq: Three times a day (TID) | ORAL | 11 refills | Status: DC
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
## 2018-05-03 NOTE — Procedures (Signed)
Full Name: [name] Gender: Female MRN #: [id number] Date of Birth: 03/29/1946    Visit Date: 05/03/2018 08:53 Age: 72 Years 8 Months Old Examining Physician: Levert Feinstein, MD  Referring Physician: Terrace Arabia, MD History: 72 years old female, with history of chronic low back pain, bilateral lower extremity paresthesia, worsening gait abnormality  Summary of the tests:  Nerve conduction study: Bilateral sural, superficial peroneal sensory responses were absent. Bilateral median sensory responses showed mildly prolonged peak latency, with mildly decreased to snap amplitude.  Bilateral ulnar sensory responses were normal.  Bilateral median, ulnar, peroneal to EDB motor responses were normal.  Bilateral tibial motor responses showed mildly decreased conduction velocity, right side as mildly decreased the C map amplitude.  Electromyography: Selective needle examinations were performed at bilateral lower extremity muscles and bilateral lumbosacral paraspinal muscles.  There is evidence of mild chronic neuropathic changes as evident by mild decreased recruitment patterns at bilateral L4-5 S1 myotomes.  There is no evidence of active denervation.   Conclusion: This is a mild abnormal study.  There is electrodiagnostic evidence of mild axonal peripheral neuropathy, there is also evidence of chronic mild bilateral lumbosacral radiculopathy, mainly involving bilateral L4, L5-S1 myotomes.  There is no evidence of active process.    ------------------------------- Levert Feinstein, M.D. Ph.D.  [facility] [address] [place], [place] [address] Tel: [phone] Fax: [phone]        [facility]    Nerve / Sites Muscle Latency Ref. Amplitude Ref. Rel Amp Segments Distance Velocity Ref. Area    ms ms mV mV %  cm m/s m/s mVms  R Median - APB     Wrist APB 3.6 ?4.4 8.8 ?4.0 100 Wrist - APB 7   35.4     Upper arm APB 7.8  7.4  84.6 Upper arm - Wrist 22 53 ?49 28.8  L Median - APB       Wrist APB 4.0 ?4.4 7.7 ?4.0 100 Wrist - APB 7   25.1     Upper arm APB 8.3  8.0  103 Upper arm - Wrist 22 51 ?49 26.2  R Ulnar - ADM     Wrist ADM 3.0 ?3.3 12.8 ?6.0 100 Wrist - ADM 7   42.6     B.Elbow ADM 6.9  12.1  94.6 B.Elbow - Wrist 20 52 ?49 40.9     A.Elbow ADM 8.8  11.4  94.2 A.Elbow - B.Elbow 10 53 ?49 39.3         A.Elbow - Wrist      L Ulnar - ADM     Wrist ADM 3.1 ?3.3 11.6 ?6.0 100 Wrist - ADM 7   36.9     B.Elbow ADM 7.0  9.6  83 B.Elbow - Wrist 20 51 ?49 33.0     A.Elbow ADM 8.8  9.6  99.7 A.Elbow - B.Elbow 10 56 ?49 34.0         A.Elbow - Wrist      R Peroneal - EDB     Ankle EDB 5.3 ?6.5 3.6 ?2.0 100 Ankle - EDB 9   10.5     Fib head EDB 12.2  3.0  82.8 Fib head - Ankle 32 46 ?44 10.5     Pop fossa EDB 14.9  2.6  87.4 Pop fossa - Fib head 12 45 ?44 8.6         Pop fossa - Ankle      L Peroneal -  EDB     Ankle EDB 5.0 ?6.5 3.5 ?2.0 100 Ankle - EDB 9   10.8     Fib head EDB 12.1  3.1  88.3 Fib head - Ankle 32 45 ?44 11.2     Pop fossa EDB 14.6  3.1  97.9 Pop fossa - Fib head 12 48 ?44 12.0         Pop fossa - Ankle      R Tibial - AH     Ankle AH 4.6 ?5.8 3.4 ?4.0 100 Ankle - AH 9   8.4     Pop fossa AH 15.0  1.6  47.7 Pop fossa - Ankle 40 39 ?41 3.6  L Tibial - AH     Ankle AH 5.1 ?5.8 7.6 ?4.0 100 Ankle - AH 9   14.1     Pop fossa AH 15.7  4.4  57.7 Pop fossa - Ankle 40 38 ?41 9.5                     SNC    Nerve / Sites Rec. Site Peak Lat Ref.  Amp Ref. Segments Distance    ms ms V V  cm  R Sural - Ankle (Calf)     Calf Ankle NR ?4.4 NR ?6 Calf - Ankle 14  L Sural - Ankle (Calf)     Calf Ankle NR ?4.4 NR ?6 Calf - Ankle 14  R Superficial peroneal - Ankle     Lat leg Ankle NR ?4.4 NR ?6 Lat leg - Ankle 14  L Superficial peroneal - Ankle     Lat leg Ankle NR ?4.4 NR ?6 Lat leg - Ankle 14  R Median - Orthodromic (Dig II, Mid palm)     Dig II Wrist 4.1 ?3.4 8 ?10 Dig II - Wrist 13  L Median - Orthodromic (Dig II, Mid palm)     Dig II Wrist 3.7 ?3.4 8  ?10 Dig II - Wrist 13  R Ulnar - Orthodromic, (Dig V, Mid palm)     Dig V Wrist 2.8 ?3.1 8 ?5 Dig V - Wrist 11  L Ulnar - Orthodromic, (Dig V, Mid palm)     Dig V Wrist 3.0 ?3.1 5 ?5 Dig V - Wrist 39                     F  Wave    Nerve F Lat Ref.   ms ms  R Tibial - AH 58.1 ?56.0  R Ulnar - ADM 29.0 ?32.0  L Ulnar - ADM 29.4 ?32.0  L Tibial - AH 57.9 ?56.0             EMG full       EMG Summary Table    Spontaneous MUAP Recruitment  Muscle IA Fib PSW Fasc Other Amp Dur. Poly Pattern  L. Tibialis anterior Increased None None None _______ Increased Normal Normal Reduced  L. Peroneus longus Normal None None None _______ Normal Normal Normal Reduced  L. Tibialis posterior Normal None None None _______ Normal Normal Normal Reduced  L. Gastrocnemius (Medial head) Normal None None None _______ Normal Normal Normal Reduced  L. Vastus lateralis Normal None None None _______ Normal Normal Normal Normal  L. Biceps femoris (short head) Normal None None None _______ Normal Normal Normal Reduced  L. Lumbar paraspinals (mid) Normal None None None _______ Normal Normal Normal Normal  L. Lumbar paraspinals (low) Normal None None None _______  Normal Normal Normal Normal  R. Tibialis anterior Normal None None None _______ Normal Normal Normal Reduced  R. Tibialis posterior Normal None None None _______ Normal Normal Normal Reduced  R. Peroneus longus Normal None None None _______ Normal Normal Normal Reduced  R. Gastrocnemius (Medial head) Normal None None None _______ Normal Normal Normal Normal  R. Biceps femoris (long head) Normal None None None _______ Normal Normal Normal Reduced  R. Lumbar paraspinals (low) Normal None None None _______ Normal Normal Normal Normal  R. Lumbar paraspinals (mid) Normal None None None _______ Normal Normal Normal Normal

## 2018-05-21 ENCOUNTER — Ambulatory Visit (INDEPENDENT_AMBULATORY_CARE_PROVIDER_SITE_OTHER): Payer: Medicare Other

## 2018-05-21 DIAGNOSIS — R001 Bradycardia, unspecified: Secondary | ICD-10-CM | POA: Diagnosis not present

## 2018-05-21 DIAGNOSIS — I10 Essential (primary) hypertension: Secondary | ICD-10-CM | POA: Diagnosis not present

## 2018-05-21 LAB — BASIC METABOLIC PANEL
BUN / CREAT RATIO: 16 (ref 12–28)
BUN: 14 mg/dL (ref 8–27)
CALCIUM: 9.3 mg/dL (ref 8.7–10.3)
CHLORIDE: 107 mmol/L — AB (ref 96–106)
CO2: 22 mmol/L (ref 20–29)
CREATININE: 0.85 mg/dL (ref 0.57–1.00)
GFR calc non Af Amer: 70 mL/min/{1.73_m2} (ref >59)
GFR, EST AFRICAN AMERICAN: 80 mL/min/{1.73_m2} (ref >59)
GLUCOSE: 90 mg/dL (ref 65–99)
Potassium: 5 mmol/L (ref 3.5–5.2)
Sodium: 144 mmol/L (ref 134–144)

## 2018-05-21 MED ORDER — AMLODIPINE BESYLATE 5 MG PO TABS: 5.0000 mg | ORAL_TABLET | Freq: Every day | ORAL | 3 refills | Status: DC
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
## 2018-05-21 NOTE — Patient Instructions (Addendum)
It was great to see you today.   Your blood pressure is not at goal of less than 130/5480mmHg, it was 150/82 today.  Start taking amlodipine 5mg  at night.   Continue taking losartan 100mg  daily.   If you notice worsening swelling in your legs or any concerns please call clinic at [phone].   Take your blood pressure at home 3-4 times per week and bring in the reading when you come to clinic.   Continue to exercise and eat a low sodium diet.   Follow up in clinic in 3-4 weeks.

## 2018-05-22 ENCOUNTER — Telehealth: Payer: Self-pay

## 2018-05-22 NOTE — Telephone Encounter (Signed)
Notes recorded by Sigurd Sosapp, Erum Cercone, RN on 05/22/2018 at 8:55 AM EDT Informed patient of results/recommendations. She verbalized understanding. Labs forwarded to PCP. ------ Patient informed me that she has a rod in her femur and is not able to do GXT on 9/4.  Please advise, thank you.

## 2018-05-22 NOTE — Telephone Encounter (Signed)
Can she walk at a slower pace - this is just to see if HR increases with activity and can be a modified Bruce protocol

## 2018-05-22 NOTE — Telephone Encounter (Signed)
Patient is fine to walk at a slower pace.  She complied to having the GXT.

## 2018-05-30 ENCOUNTER — Ambulatory Visit (INDEPENDENT_AMBULATORY_CARE_PROVIDER_SITE_OTHER): Payer: Medicare Other

## 2018-05-30 DIAGNOSIS — I10 Essential (primary) hypertension: Secondary | ICD-10-CM | POA: Diagnosis not present

## 2018-05-30 DIAGNOSIS — R001 Bradycardia, unspecified: Secondary | ICD-10-CM

## 2018-05-30 LAB — EXERCISE TOLERANCE TEST
CHL CUP RESTING HR STRESS: 77 {beats}/min
CHL RATE OF PERCEIVED EXERTION: 15
CSEPEW: 4.6 METS
CSEPPHR: 130 {beats}/min
Exercise duration (min): 9 min
Exercise duration (sec): 0 s
MPHR: 150 {beats}/min
Percent HR: 86 %

## 2018-06-14 ENCOUNTER — Ambulatory Visit (INDEPENDENT_AMBULATORY_CARE_PROVIDER_SITE_OTHER): Payer: Medicare Other | Admitting: Pharmacist

## 2018-06-14 VITALS — BP 134/84 | HR 57

## 2018-06-14 DIAGNOSIS — I10 Essential (primary) hypertension: Secondary | ICD-10-CM

## 2018-06-14 NOTE — Progress Notes (Signed)
Patient ID: [name]                 DOB: September 26, 1945                      MRN: [id number]     HPI: [name] is a 72 y.o. female referred by Dr. Mayford Knife to HTN clinic. PMH is significant for CHF (55-65% in 2009), depression, GERD, hyperlipidemia, hypertension, sleep apnea, and bradycardia. At last visit in HTN clinic, BP was elevated at 150/82 and pt was started on amlodipine 5mg  daily.   [name] presents in good spirits. She denies dizziness, falls, and blurred vision. Does endorse chronic balance problems and headache. She has started monitoring her BP at home and readings have improved to 120-150/70-90, HR 60. Patient has been diagnosed with sleep apnea however does not use a CPAP machine due to nightmares she experienced while on the machine. She eats a low sodium diet and exercises 20 minutes a day. She is under more stress than normal due to positive Cologuard test. She has a colonoscopy on Tuesday.  Current HTN meds: losartan 100mg  daily (AM), amlodipine 5mg  daily (PM)  BP goal: <130/71mmHg   Family History: Diabetes in her maternal grandmother and mother; Hyperlipidemia in her brother, mother, and sister; Hypertension in her brother, brother, and mother  Social History: denies smoking, alcohol and illicit drug use   Diet:   Breakfast: cereal, raisin toast, peanut butter, egg, coffee Lunch: crackers, banana Dinner: varies - chicken, veggies  Snacks often: ice cream, candy  Exercise:  walk 1 mile daily (~20 min)   Home BP readings: 129/83, 143/97, 152/91, 124/69, 140/89, 120/75, HR 60  Wt Readings from Last 3 Encounters:  04/25/18 227 lb 3.2 oz (103.1 kg)  04/10/18 228 lb 6.4 oz (103.6 kg)  03/25/18 226 lb 12 oz (102.9 kg)   BP Readings from Last 3 Encounters:  05/21/18 (!) 150/82  04/25/18 (!) 162/100  04/10/18 (!) 156/84   Pulse Readings from Last 3 Encounters:  05/21/18 (!) 57  04/25/18 (!) 55  04/10/18 88    Renal function: CrCl cannot be calculated  (Patient's most recent lab result is older than the maximum 21 days allowed.).  Past Medical History:  Diagnosis Date  . Arthritis   . Chronic headaches   . Congestive heart failure (CHF) (HCC)   . Depression   . GERD (gastroesophageal reflux disease)   . H/O blood clots    "back leg saddle block"  . Hyperlipidemia   . Hypertension   . Imbalance   . Neuropathy   . Obesity   . Sleep apnea     Current Outpatient Medications on File Prior to Visit  Medication Sig Dispense Refill  . ALPRAZolam (XANAX) 0.5 MG tablet Take 1 tablet (0.5 mg total) by mouth at bedtime as needed for anxiety. (Patient taking differently: Take 0.5 mg by mouth at bedtime as needed for anxiety (for flying and MRI's). ) 3 tablet 0  . amLODipine (NORVASC) 5 MG tablet Take 1 tablet (5 mg total) by mouth daily. 90 tablet 3  . atorvastatin (LIPITOR) 20 MG tablet Take 1 tablet (20 mg total) by mouth daily. 90 tablet 2  . gabapentin (NEURONTIN) 100 MG capsule Take 1 capsule (100 mg total) by mouth 3 (three) times daily. 90 capsule 11  . gabapentin (NEURONTIN) 300 MG capsule Take 1 capsule (300 mg total) by mouth 2 (two) times daily. 180 capsule 1  . losartan (COZAAR)  100 MG tablet Take 1 tablet (100 mg total) by mouth daily. 90 tablet 3  . omeprazole (PRILOSEC) 20 MG capsule Take 1 capsule (20 mg total) by mouth daily. 90 capsule 2  . ondansetron (ZOFRAN-ODT) 8 MG disintegrating tablet Take 1 tablet (8 mg total) by mouth every 8 (eight) hours as needed for nausea or vomiting. 20 tablet 0  . polyethylene glycol-electrolytes (NULYTELY/GOLYTELY) 420 g solution Take 4,000 mLs by mouth as directed. 4000 mL 0  . sertraline (ZOLOFT) 50 MG tablet TAKE 1 TABLET BY MOUTH DAILY. 90 tablet 0  . SUMAtriptan-naproxen (TREXIMET) 85-500 MG tablet Take 1 tablet by mouth every 2 (two) hours as needed for migraine. 10 tablet 2  . topiramate (TOPAMAX) 50 MG tablet TAKE 1 TABLET BY MOUTH 2 TIMES DAILY. 180 tablet 0   No current  facility-administered medications on file prior to visit.     Allergies  Allergen Reactions  . Contrast Media [Iodinated Diagnostic Agents]   . Dye Fdc Red [Red Dye]   . Latex Hives  . Other     Shell fish   . Tuna Oil [Fish Oil]      Assessment/Plan:  1. Hypertension -  BP has improved significantly and is close to goal <130/6380mmHg. Would like to increase amlodipine to 7.5mg  daily however pt prefers to continue on 5mg  dose in addition to her losartan 100mg  daily. She has been under more stress than normal due to positive Cologuard test and has f/u colonosocpy next week. Advised pt to continue to monitor BP readings and if > 50% of readings are >130/6980mmHg in 2-3 weeks for pt to call clinic for dose titration of amlodipine.   [name], PharmD, BCACP, CPP [facility] [address], [place], [place] [address] Phone: [phone]; Fax: [phone] 06/14/2018 10:59 AM

## 2018-06-14 NOTE — Patient Instructions (Signed)
Continue taking losartan 100mg  every morning and amlodipine 5mg  every evening for your blood pressure  Continue to monitor your blood pressure at home. If more than 50% of your readings are above 130/80 in about 2 weeks, please call Aundra MilletMegan, Pharmacist in the blood pressure clinic at #[phone] so that we can discuss your readings and potentially increase your amlodipine to 7.5mg  daily.

## 2018-06-18 ENCOUNTER — Ambulatory Visit (AMBULATORY_SURGERY_CENTER): Payer: Medicare Other | Admitting: Gastroenterology

## 2018-06-18 ENCOUNTER — Encounter: Payer: Self-pay | Admitting: Gastroenterology

## 2018-06-18 VITALS — BP 133/78 | HR 56 | Temp 97.3°F | Resp 11 | Ht 68.0 in | Wt 228.0 lb

## 2018-06-18 DIAGNOSIS — R195 Other fecal abnormalities: Secondary | ICD-10-CM

## 2018-06-18 MED ORDER — SODIUM CHLORIDE 0.9 % IV SOLN: 500.0000 mL | Freq: Once | INTRAVENOUS | Status: DC

## 2018-06-18 NOTE — Op Note (Signed)
[facility] Patient Name: Nathen Mayrlene Goines Procedure Date: 06/18/2018 10:33 AM MRN: [id number] Endoscopist: Rachael Feeaniel P Jacobs , MD Age: [age] Referring MD:  Date of Birth: 29-Apr-1946 Gender: Female Account #: [id number] Procedure:                Colonoscopy Indications:              Positive Cologuard test Medicines:                Monitored Anesthesia Care Procedure:                Pre-Anesthesia Assessment:                           - Prior to the procedure, a History and Physical                            was performed, and patient medications and                            allergies were reviewed. The patient's tolerance of                            previous anesthesia was also reviewed. The risks                            and benefits of the procedure and the sedation                            options and risks were discussed with the patient.                            All questions were answered, and informed consent                            was obtained. Prior Anticoagulants: The patient has                            taken no previous anticoagulant or antiplatelet                            agents. ASA Grade Assessment: II - A patient with                            mild systemic disease. After reviewing the risks                            and benefits, the patient was deemed in                            satisfactory condition to undergo the procedure.                           After obtaining informed consent, the colonoscope  was passed under direct vision. Throughout the                            procedure, the patient's blood pressure, pulse, and                            oxygen saturations were monitored continuously. The                            Colonoscope was introduced through the anus and                            advanced to the the cecum, identified by                            appendiceal orifice and ileocecal valve. The                             colonoscopy was performed without difficulty. The                            patient tolerated the procedure well. The quality                            of the bowel preparation was good. The ileocecal                            valve, appendiceal orifice, and rectum were                            photographed. Scope In: 10:47:17 AM Scope Out: 11:05:17 AM Scope Withdrawal Time: 0 hours 8 minutes 3 seconds  Total Procedure Duration: 0 hours 18 minutes 0 seconds  Findings:                 The entire examined colon appeared normal on direct                            and retroflexion views. Complications:            No immediate complications. Estimated blood loss:                            None. Estimated Blood Loss:     Estimated blood loss: none. Impression:               - The entire examined colon is normal on direct and                            retroflexion views.                           - No polys or cancers. Recommendation:           - Patient has a contact number available for  emergencies. The signs and symptoms of potential                            delayed complications were discussed with the                            patient. Return to normal activities tomorrow.                            Written discharge instructions were provided to the                            patient.                           - Resume previous diet.                           - Continue present medications.                           - Repeat colonoscopy in 10 years for screening                            purposes. Rachael Fee, MD 06/18/2018 11:08:48 AM This report has been signed electronically.

## 2018-06-18 NOTE — Patient Instructions (Signed)
YOU HAD AN ENDOSCOPIC PROCEDURE TODAY AT THE [facility]:   Refer to the procedure report that was given to you for any specific questions about what was found during the examination.  If the procedure report does not answer your questions, please call your gastroenterologist to clarify.  If you requested that your care partner not be given the details of your procedure findings, then the procedure report has been included in a sealed envelope for you to review at your convenience later.  YOU SHOULD EXPECT: Some feelings of bloating in the abdomen. Passage of more gas than usual.  Walking can help get rid of the air that was put into your GI tract during the procedure and reduce the bloating. If you had a lower endoscopy (such as a colonoscopy or flexible sigmoidoscopy) you may notice spotting of blood in your stool or on the toilet paper. If you underwent a bowel prep for your procedure, you may not have a normal bowel movement for a few days.  Please Note:  You might notice some irritation and congestion in your nose or some drainage.  This is from the oxygen used during your procedure.  There is no need for concern and it should clear up in a day or so.  SYMPTOMS TO REPORT IMMEDIATELY:   Following lower endoscopy (colonoscopy or flexible sigmoidoscopy):  Excessive amounts of blood in the stool  Significant tenderness or worsening of abdominal pains  Swelling of the abdomen that is new, acute  Fever of 100F or higher   For urgent or emergent issues, a gastroenterologist can be reached at any hour by calling [phone].   DIET:  We do recommend a small meal at first, but then you may proceed to your regular diet.  Drink plenty of fluids but you should avoid alcoholic beverages for 24 hours.  ACTIVITY:  You should plan to take it easy for the rest of today and you should NOT DRIVE or use heavy machinery until tomorrow (because of the sedation medicines used during the test).     FOLLOW UP: Our staff will call the number listed on your records the next business day following your procedure to check on you and address any questions or concerns that you may have regarding the information given to you following your procedure. If we do not reach you, we will leave a message.  However, if you are feeling well and you are not experiencing any problems, there is no need to return our call.  We will assume that you have returned to your regular daily activities without incident.    SIGNATURES/CONFIDENTIALITY: You and/or your care partner have signed paperwork which will be entered into your electronic medical record.  These signatures attest to the fact that that the information above on your After Visit Summary has been reviewed and is understood.  Full responsibility of the confidentiality of this discharge information lies with you and/or your care-partner. 

## 2018-06-18 NOTE — Progress Notes (Signed)
Spontaneous respirations throughout. VSS. Resting comfortably. To PACU on room air. Report to  RN. 

## 2018-06-19 ENCOUNTER — Telehealth: Payer: Self-pay | Admitting: *Deleted

## 2018-06-19 ENCOUNTER — Telehealth: Payer: Self-pay

## 2018-06-19 NOTE — Telephone Encounter (Signed)
First follow-up call attempt.  Voicemail with no identifiers.  No message left. 

## 2018-06-19 NOTE — Telephone Encounter (Signed)
  Follow up Call-  Call back number 06/18/2018  Post procedure Call Back phone  # [phone]  Permission to leave phone message Yes  Some recent data might be hidden     Patient questions:  Do you have a fever, pain , or abdominal swelling? No. Pain Score  0 *  Have you tolerated food without any problems? Yes.    Have you been able to return to your normal activities? Yes.    Do you have any questions about your discharge instructions: Diet   No. Medications  No. Follow up visit  No.  Do you have questions or concerns about your Care? No.  Actions: * If pain score is 4 or above: No action needed, pain <4.

## 2018-07-03 ENCOUNTER — Telehealth: Payer: Self-pay | Admitting: Pharmacist

## 2018-07-03 MED ORDER — AMLODIPINE BESYLATE 5 MG PO TABS: 7.5000 mg | ORAL_TABLET | Freq: Every day | ORAL | 3 refills | Status: DC

## 2018-07-03 NOTE — Addendum Note (Signed)
Addended by: SUPPLE, MEGAN E on: 07/03/2018 12:08 PM   Modules accepted: Orders

## 2018-07-03 NOTE — Telephone Encounter (Signed)
Spoke with pt - she reports her BP has been coming down slowly. 136/84 today, low 118/75, high of 144/96. She is willing to increase her amlodipine to 7.5mg . She sees her PCP soon who will check her BP. She was advised to continue to monitor readings and to call clinic if readings remain > 130/72mmHG=g in the next few weeks.

## 2018-07-03 NOTE — Telephone Encounter (Signed)
LMOM for pt - following up regarding BP which was elevated at last office visit. However, pt reported more stress due to positive Cologuard test. She underwent colonoscopy on 9/24 which was negative for cancer. Following up to see if BP more controlled at home with less stress now. If BP remain > 130/51mmHg, will plan to increase amlodipine to 7.5mg  daily.
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
## 2018-07-23 ENCOUNTER — Encounter: Payer: Self-pay | Admitting: Adult Health

## 2018-07-23 ENCOUNTER — Telehealth: Payer: Self-pay | Admitting: Neurology

## 2018-07-23 ENCOUNTER — Ambulatory Visit (INDEPENDENT_AMBULATORY_CARE_PROVIDER_SITE_OTHER): Payer: Medicare Other | Admitting: Adult Health

## 2018-07-23 VITALS — BP 134/83 | HR 60 | Ht 68.0 in | Wt 235.9 lb

## 2018-07-23 DIAGNOSIS — Z0000 Encounter for general adult medical examination without abnormal findings: Secondary | ICD-10-CM

## 2018-07-23 DIAGNOSIS — F339 Major depressive disorder, recurrent, unspecified: Secondary | ICD-10-CM

## 2018-07-23 DIAGNOSIS — I10 Essential (primary) hypertension: Secondary | ICD-10-CM | POA: Diagnosis not present

## 2018-07-23 DIAGNOSIS — G43909 Migraine, unspecified, not intractable, without status migrainosus: Secondary | ICD-10-CM

## 2018-07-23 DIAGNOSIS — E785 Hyperlipidemia, unspecified: Secondary | ICD-10-CM | POA: Diagnosis not present

## 2018-07-23 MED ORDER — SERTRALINE HCL 50 MG PO TABS: 50.0000 mg | ORAL_TABLET | Freq: Every day | ORAL | 3 refills | Status: DC

## 2018-07-23 MED ORDER — OMEPRAZOLE 20 MG PO CPDR: 20.0000 mg | DELAYED_RELEASE_CAPSULE | Freq: Every day | ORAL | 2 refills | Status: DC

## 2018-07-23 MED ORDER — ATORVASTATIN CALCIUM 20 MG PO TABS: 20.0000 mg | ORAL_TABLET | Freq: Every day | ORAL | 2 refills | Status: DC

## 2018-07-23 NOTE — Assessment & Plan Note (Signed)
Has been on Topiramate 50mg  BID for years, still experiencing HAs Recommend requesting refills from Neurology

## 2018-07-23 NOTE — Assessment & Plan Note (Signed)
Again declined Behavioral Health referral Mood stable on Sertraline 50mg  QD She denies suicidal plan Granddaughter will be moving in with her this Jan for two years while she attends local beauty school

## 2018-07-23 NOTE — Patient Instructions (Addendum)
Mediterranean Diet A Mediterranean diet refers to food and lifestyle choices that are based on the traditions of countries located on the [place]. This way of eating has been shown to help prevent certain conditions and improve outcomes for people who have chronic diseases, like kidney disease and heart disease. What are tips for following this plan? Lifestyle  Cook and eat meals together with your family, when possible.  Drink enough fluid to keep your urine clear or pale yellow.  Be physically active every day. This includes: ? Aerobic exercise like running or swimming. ? Leisure activities like gardening, walking, or housework.  Get 7-8 hours of sleep each night.  If recommended by your health care provider, drink red wine in moderation. This means 1 glass a day for nonpregnant women and 2 glasses a day for men. A glass of wine equals 5 oz (150 mL). Reading food labels  Check the serving size of packaged foods. For foods such as rice and pasta, the serving size refers to the amount of cooked product, not dry.  Check the total fat in packaged foods. Avoid foods that have saturated fat or trans fats.  Check the ingredients list for added sugars, such as corn syrup. Shopping  At the grocery store, buy most of your food from the areas near the walls of the store. This includes: ? Fresh fruits and vegetables (produce). ? Grains, beans, nuts, and seeds. Some of these may be available in unpackaged forms or large amounts (in bulk). ? Fresh seafood. ? Poultry and eggs. ? Low-fat dairy products.  Buy whole ingredients instead of prepackaged foods.  Buy fresh fruits and vegetables in-season from local farmers markets.  Buy frozen fruits and vegetables in resealable bags.  If you do not have access to quality fresh seafood, buy precooked frozen shrimp or canned fish, such as tuna, salmon, or sardines.  Buy small amounts of raw or cooked vegetables, salads, or olives from the  deli or salad bar at your store.  Stock your pantry so you always have certain foods on hand, such as olive oil, canned tuna, canned tomatoes, rice, pasta, and beans. Cooking  Cook foods with extra-virgin olive oil instead of using butter or other vegetable oils.  Have meat as a side dish, and have vegetables or grains as your main dish. This means having meat in small portions or adding small amounts of meat to foods like pasta or stew.  Use beans or vegetables instead of meat in common dishes like chili or lasagna.  Experiment with different cooking methods. Try roasting or broiling vegetables instead of steaming or sauteing them.  Add frozen vegetables to soups, stews, pasta, or rice.  Add nuts or seeds for added healthy fat at each meal. You can add these to yogurt, salads, or vegetable dishes.  Marinate fish or vegetables using olive oil, lemon juice, garlic, and fresh herbs. Meal planning  Plan to eat 1 vegetarian meal one day each week. Try to work up to 2 vegetarian meals, if possible.  Eat seafood 2 or more times a week.  Have healthy snacks readily available, such as: ? Vegetable sticks with hummus. ? Greek yogurt. ? Fruit and nut trail mix.  Eat balanced meals throughout the week. This includes: ? Fruit: 2-3 servings a day ? Vegetables: 4-5 servings a day ? Low-fat dairy: 2 servings a day ? Fish, poultry, or lean meat: 1 serving a day ? Beans and legumes: 2 or more servings a week ? Nuts   and seeds: 1-2 servings a day ? Whole grains: 6-8 servings a day ? Extra-virgin olive oil: 3-4 servings a day  Limit red meat and sweets to only a few servings a month What are my food choices?  Mediterranean diet ? Recommended ? Grains: Whole-grain pasta. Brown rice. Bulgar wheat. Polenta. Couscous. Whole-wheat bread. Orpah Cobb. ? Vegetables: Artichokes. Beets. Broccoli. Cabbage. Carrots. Eggplant. Green beans. Chard. Kale. Spinach. Onions. Leeks. Peas. Squash.  Tomatoes. Peppers. Radishes. ? Fruits: Apples. Apricots. Avocado. Berries. Bananas. Cherries. Dates. Figs. Grapes. Lemons. Melon. Oranges. Peaches. Plums. Pomegranate. ? Meats and other protein foods: Beans. Almonds. Sunflower seeds. Pine nuts. Peanuts. Cod. Salmon. Scallops. Shrimp. Tuna. Tilapia. Clams. Oysters. Eggs. ? Dairy: Low-fat milk. Cheese. Greek yogurt. ? Beverages: Water. Red wine. Herbal tea. ? Fats and oils: Extra virgin olive oil. Avocado oil. Grape seed oil. ? Sweets and desserts: [place] yogurt with honey. Baked apples. Poached pears. Trail mix. ? Seasoning and other foods: Basil. Cilantro. Coriander. Cumin. Mint. Parsley. Sage. Rosemary. Tarragon. Garlic. Oregano. Thyme. Pepper. Balsalmic vinegar. Tahini. Hummus. Tomato sauce. Olives. Mushrooms. ? Limit these ? Grains: Prepackaged pasta or rice dishes. Prepackaged cereal with added sugar. ? Vegetables: Deep fried potatoes (french fries). ? Fruits: Fruit canned in syrup. ? Meats and other protein foods: Beef. Pork. Lamb. Poultry with skin. Hot dogs. Tomasa Blase. ? Dairy: Ice cream. Sour cream. Whole milk. ? Beverages: Juice. Sugar-sweetened soft drinks. Beer. Liquor and spirits. ? Fats and oils: Butter. Canola oil. Vegetable oil. Beef fat (tallow). Lard. ? Sweets and desserts: Cookies. Cakes. Pies. Candy. ? Seasoning and other foods: Mayonnaise. Premade sauces and marinades. ? The items listed may not be a complete list. Talk with your dietitian about what dietary choices are right for you. Summary  The Mediterranean diet includes both food and lifestyle choices.  Eat a variety of fresh fruits and vegetables, beans, nuts, seeds, and whole grains.  Limit the amount of red meat and sweets that you eat.  Talk with your health care provider about whether it is safe for you to drink red wine in moderation. This means 1 glass a day for nonpregnant women and 2 glasses a day for men. A glass of wine equals 5 oz (150 mL). This information  is not intended to replace advice given to you by your health care provider. Make sure you discuss any questions you have with your health care provider. Document Released: 05/04/2016 Document Revised: 06/06/2016 Document Reviewed: 05/04/2016 Elsevier Interactive Patient Education  2018 [employer].  Persistent Depressive Disorder Persistent depressive disorder (PDD) is a mental health condition. PDD causes symptoms of low-level depression for 2 years or longer. It may also be called long-term (chronic) depression or dysthymia. PDD may include episodes of more severe depression that last for about 2 weeks (major depressive disorder or MDD). PDD can affect the way you think, feel, and sleep. This condition may also affect your relationships. You may be more likely to get sick if you have PDD. Symptoms of PDD occur for most of the day and may include:  Feeling tired (fatigue).  Low energy.  Eating too much or too little.  Sleeping too much or too little.  Feeling restless or agitated.  Feeling hopeless.  Feeling worthless or guilty.  Feeling worried or nervous (anxiety).  Trouble concentrating or making decisions.  Low self-esteem.  A negative way of looking at things (outlook).  Not being able to have fun or feel pleasure.  Avoiding interacting with people.  Getting angry or  annoyed easily (irritability).  Acting aggressive or angry.  Follow these instructions at home: Activity  Go back to your normal activities as told by your doctor.  Exercise regularly as told by your doctor. General instructions  Take over-the-counter and prescription medicines only as told by your doctor.  Do not drink alcohol. Or, limit how much alcohol you drink to no more than 1 drink a day for nonpregnant women and 2 drinks a day for men. One drink equals 12 oz of beer, 5 oz of wine, or 1 oz of hard liquor. Alcohol can affect any antidepressant medicines you are taking. Talk with your  doctor about your alcohol use.  Eat a healthy diet and get plenty of sleep.  Find activities that you enjoy each day.  Consider joining a support group. Your doctor may be able to suggest a support group.  Keep all follow-up visits as told by your doctor. This is important. Where to find more information: [employer] on Mental Illness  www.nami.org  [employer] of Mental Health  http://www.maynard.net/  National Suicide Prevention Lifeline  417-621-2937 740-471-1143). This is free, 24-hour help.  Contact a doctor if:  Your symptoms get worse.  You have new symptoms.  You have trouble sleeping or doing your daily activities. Get help right away if:  You self-harm.  You have serious thoughts about hurting yourself or others.  You see, hear, taste, smell, or feel things that are not there (hallucinate). This information is not intended to replace advice given to you by your health care provider. Make sure you discuss any questions you have with your health care provider. Document Released: 08/23/2015 Document Revised: 05/05/2016 Document Reviewed: 05/05/2016 Elsevier Interactive Patient Education  2017 [employer].  1) Since you have now established with a Neurologist- Please call and ask to have your Gabapentin filled from them. You were taking 300mg  BID and recently added 100mg  TID, so recommend 300mg  TID Also please ask to have Topiramate 50mg  BID filled by Neurology (previously filled by [place] because you did not have established Neurologist). 2) Continue with Cardiologist as directed, excellent improvement on your blood pressure! 3) Increase regular walking, strive to walk at least twice weekly. 4) Recommend joining local [employer] 5) Recommend joining local [employer] 6) If you would like referral to [facility], please call clinic. 7) Refills sent in on Sertraline, Atorvastatin, and Omeprazole. 8) Increase water intake and follow  Mediterranean diet. 9) Please schedule fasting lab appt at your convenience and complete physical in 6 months. GREAT TO SEE YOU!

## 2018-07-23 NOTE — Telephone Encounter (Signed)
Pts called stating her PCP would like Dr. Terrace Arabia to take over refills for gabapentin (NEURONTIN) 300 MG capsule and topiramate (TOPAMAX) 50 MG tablet. Pt needing refills sent to [facility] drug

## 2018-07-23 NOTE — Assessment & Plan Note (Signed)
1) Since you have now established with a Neurologist- Please call and ask to have your Gabapentin filled from them. You were taking 300mg  BID and recently added 100mg  TID, so recommend 300mg  TID Also please ask to have Topiramate 50mg  BID filled by Neurology (previously filled by [place] because you did not have established Neurologist). 2) Continue with Cardiologist as directed, excellent improvement on your blood pressure! 3) Increase regular walking, strive to walk at least twice weekly. 4) Recommend joining local [employer] 5) Recommend joining local [employer] 6) If you would like referral to [facility], please call clinic. 7) Refills sent in on Sertraline, Atorvastatin, and Omeprazole. 8) Increase water intake and follow Mediterranean diet. 9) Please schedule fasting lab appt at your convenience and complete physical in 6 months

## 2018-07-23 NOTE — Telephone Encounter (Signed)
Spoke to patient - she has history of migraines.  She is currently using Topamax, Treximet and XofranODT for migraine management.  Per patient, her PCP would like Dr. Terrace Arabia to take over her migraine management.  She has an appointment on 07/24/18 to have her migraines evaluated.

## 2018-07-23 NOTE — Assessment & Plan Note (Signed)
Will obtain fasting labs, ASAP Currently tolerating Atorvastatin 20mg  QD well Follow Mediterranean diet, increase regular walking

## 2018-07-23 NOTE — Assessment & Plan Note (Addendum)
BP 134/83, HR 60 Currently on Losartan 100mg  QD and Amlodipine 5mg  QD,  BP Goal <130/80. Mediterranean Diet, increase regular walking

## 2018-07-24 ENCOUNTER — Encounter: Payer: Self-pay | Admitting: Neurology

## 2018-07-24 ENCOUNTER — Ambulatory Visit (INDEPENDENT_AMBULATORY_CARE_PROVIDER_SITE_OTHER): Payer: Medicare Other | Admitting: Neurology

## 2018-07-24 VITALS — BP 163/92 | HR 62 | Ht 68.0 in | Wt 235.5 lb

## 2018-07-24 DIAGNOSIS — G43909 Migraine, unspecified, not intractable, without status migrainosus: Secondary | ICD-10-CM | POA: Diagnosis not present

## 2018-07-24 DIAGNOSIS — G629 Polyneuropathy, unspecified: Secondary | ICD-10-CM | POA: Diagnosis not present

## 2018-07-24 MED ORDER — TOPIRAMATE 50 MG PO TABS: ORAL_TABLET | ORAL | 4 refills | Status: DC

## 2018-07-24 MED ORDER — GABAPENTIN 300 MG PO CAPS: 300.0000 mg | ORAL_CAPSULE | Freq: Three times a day (TID) | ORAL | 4 refills | Status: DC
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
## 2018-10-31 ENCOUNTER — Ambulatory Visit: Payer: Medicare Other | Admitting: Nurse Practitioner

## 2018-10-31 ENCOUNTER — Telehealth: Payer: Self-pay | Admitting: *Deleted

## 2018-10-31 NOTE — Telephone Encounter (Signed)
Called pt and asked about if coming to appt due to weather.  She would prefer not to, but did not want to be charged fee.  I told her she would not be charged fee.  So cancelled appt and she will call back.  She stated that she is better with last appt med changes done per Dr. Terrace Arabia.  I offered to reschedule appt, and made known that for refills need app yearly.  She would call when needs refill and if needs appt then will make.

## 2018-11-07 ENCOUNTER — Ambulatory Visit: Payer: Medicare Other | Admitting: Neurology

## 2019-01-20 NOTE — Progress Notes (Signed)
Virtual Visit via Telephone Note  I connected with [name] on 01/21/19 at  1:15 PM EDT by telephone and verified that I am speaking with the correct person using two identifiers.   I discussed the limitations, risks, security and privacy concerns of performing an evaluation and management service by telephone and the availability of in person appointments. I also discussed with the patient that there [name] be a patient responsible charge related to this service. The patient expressed understanding and agreed to proceed.  Location of Patient- Home Location of Provider- In Clinic   Subjective:   [name] is a 73 y.o. female who presents for Medicare Annual (Subsequent) preventive examination.  Review of Systems: General:   No F/C, wt loss Pulm:   No DIB, no increase SOB, pleuritic chest pain Card:  No CP, palpitations Abd:  No n/v/d or pain Ext:  No inc edema from baseline This patient does not have sx concerning for COVID-19 Infection (ie; fever, chills, cough, new or worsening shortness of breath). Objective:     Vitals: BP (!) 139/97   Pulse 62   Temp (!) 96.4 F (35.8 C) (Oral)   Ht  (1.727 m)   Wt 231 lb (104.8 kg)   BMI 35.12 kg/m   Body mass index is 35.12 kg/m.  Advanced Directives 02/01/2017  Does Patient Have a Medical Advance Directive? Yes  Type of Advance Directive Living will;Healthcare Power of [place];Out of facility DNR (pink MOST or yellow form)  Copy of Healthcare Power of Attorney in Chart? No - copy requested    Tobacco Social History   Tobacco Use  Smoking Status Never Smoker  Smokeless Tobacco Never Used     Counseling given: Not Answered  Past Medical History:  Diagnosis Date  . Anxiety   . Arthritis   . Cataract   . Chronic headaches   . Congestive heart failure (CHF) (HCC)   . Depression   . GERD (gastroesophageal reflux disease)   . H/O blood clots    "back leg saddle block"  . Hyperlipidemia   . Hypertension   .  Imbalance   . 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 Neuropathy   . Obesity   . Sleep apnea    Past Surgical History:  Procedure Laterality Date  . ABDOMINAL HYSTERECTOMY    . left arm and wrist surgery - broken    . leg reduction     Family History  Problem Relation Age of Onset  . Diabetes Mother   . Hypertension Mother   . Hyperlipidemia Mother   . Parkinson's disease Mother   . Alcohol abuse Father   . Hyperlipidemia Sister   . Hyperlipidemia Brother   . Hypertension Brother   . Parkinson's disease Maternal Grandmother   . Diabetes Maternal Grandmother   . Colon cancer Maternal Uncle   . Hypertension Brother   . Irritable bowel syndrome Child    Social History   Socioeconomic History  . Marital status: Widowed    Spouse name: Not on file  . Number of children: 2  . Years of education: some college  . Highest education level: Not on file  Occupational History  . Occupation: Retired  Engineer, production  . Financial resource strain: Not on file  . Food insecurity:    Worry: Not on file    Inability: Not on file  . Transportation needs:    Medical: Not on file    Non-medical: Not  on file  Tobacco Use  . Smoking status: Never Smoker  . Smokeless tobacco: Never Used  Substance and Sexual Activity  . Alcohol use: No  . Drug use: No  . Sexual activity: Not on file  Lifestyle  . Physical activity:    Days per week: Not on file    Minutes per session: Not on file  . Stress: Not on file  Relationships  . Social connections:    Talks on phone: Not on file    Gets together: Not on file    Attends religious service: Not on file    Active member of club or organization: Not on file    Attends meetings of clubs or organizations: Not on file    Relationship status: Not on file  Other Topics Concern  . Not on file  Social History Narrative   Lives alone.   Right-handed.   1-2 cups caffeine daily.    Outpatient Encounter Medications as  of 01/21/2019  Medication Sig  . amLODipine (NORVASC) 5 MG tablet Take 1.5 tablets (7.5 mg total) by mouth daily.  Marland Kitchen atorvastatin (LIPITOR) 20 MG tablet Take 1 tablet (20 mg total) by mouth daily.  Marland Kitchen gabapentin (NEURONTIN) 300 MG capsule Take 1 capsule (300 mg total) by mouth 3 (three) times daily.  Marland Kitchen losartan (COZAAR) 100 MG tablet Take 1 tablet (100 mg total) by mouth daily.  Marland Kitchen omeprazole (PRILOSEC) 20 MG capsule Take 1 capsule (20 mg total) by mouth daily.  . sertraline (ZOLOFT) 50 MG tablet Take 1 tablet (50 mg total) by mouth daily.  . SUMAtriptan-naproxen (TREXIMET) 85-500 MG tablet Take 1 tablet by mouth every 2 (two) hours as needed for migraine.  . topiramate (TOPAMAX) 50 MG tablet One po qam and then 2 tabs qhs  . Zoster Vaccine Adjuvanted [facility]) injection Inject 0.5 mLs into the muscle once for 1 dose.  . [DISCONTINUED] ALPRAZolam (XANAX) 0.5 MG tablet Take 1 tablet (0.5 mg total) by mouth at bedtime as needed for anxiety. (Patient taking differently: Take 0.5 mg by mouth at bedtime as needed (for flying and MRI's). )  . [DISCONTINUED] gabapentin (NEURONTIN) 100 MG capsule Take 1 capsule (100 mg total) by mouth 3 (three) times daily.  . [DISCONTINUED] ondansetron (ZOFRAN-ODT) 8 MG disintegrating tablet Take 1 tablet (8 mg total) by mouth every 8 (eight) hours as needed for nausea or vomiting.   No facility-administered encounter medications on file as of 01/21/2019.     Activities of Daily Living In your present state of health, do you have any difficulty performing the following activities: 01/21/2019  Hearing? N  Vision? Y  Difficulty concentrating or making decisions? Y  Walking or climbing stairs? Y  Dressing or bathing? N  Doing errands, shopping? N  Some recent data might be hidden    Patient Care Team: Julaine Fusi, NP as PCP - General (Family Medicine)    Assessment:   This is a routine wellness examination for [name].  Exercise Activities and Dietary  recommendations Walking around home and back deck  Performing house/yeard work  Fall Risk Fall Risk  01/21/2019 01/21/2018 07/19/2017  Falls in the past year? 1 Yes Yes  Number falls in past yr: 1 2 or more 2 or more  Injury with Fall? 0 No No  Risk for fall due to : Impaired balance/gait - -  Follow up Falls evaluation completed - -   Is the patient's home free of loose throw rugs in walkways, pet beds, electrical  cords, etc?   yes      Grab bars in the bathroom? no      Handrails on the stairs?   yes      Adequate lighting?   yes  Timed Get Up and Go performed: N/A, encounter not performed in clinic  Depression Screen PHQ 2/9 Scores 01/21/2019 07/23/2018 01/21/2018 07/19/2017  PHQ - 2 Score 5 5 2 5   PHQ- 9 Score 13 14 8 18      Cognitive Function-WNL     6CIT Screen 01/21/2019  What Year? 0 points  What month? 0 points  What time? 0 points  Count back from 20 0 points  Months in reverse 0 points  Repeat phrase 4 points  Total Score 4    Immunization History  Administered Date(s) Administered  . Influenza, High Dose Seasonal PF 07/19/2017  . Pneumococcal Conjugate-13 09/09/2015, 12/09/2015  . Pneumococcal Polysaccharide-23 07/19/2017  . Tdap 06/15/2015  . Zoster 09/26/2007    Qualifies for Shingles Vaccine?Yes  Screening Tests Health Maintenance  Topic Date Due  . Hepatitis C Screening  04-[name]-1947  . MAMMOGRAM  08/11/1997  . DEXA SCAN  08/11/2012  . INFLUENZA VACCINE  04/26/2019  . TETANUS/TDAP  06/14/2025  . COLONOSCOPY  06/18/2028  . PNA vac Low Risk Adult  Completed    Cancer Screenings: Lung: Low Dose CT Chest recommended if Age 60-80 years, 30 pack-year currently smoking OR have quit w/in 15years. Patient does not qualify. Breast:  Up to date on Mammogram? No   Up to date of Bone Density/Dexa? No Colorectal: UTD, last 06/18/2018  Additional Screenings:  Hepatitis C Screening: Pt Declined     Plan:  Continue all medications as directed. Remain  well hydrated, follow Mediterranean Diet- reduce CHO snacking 6 week-  Fasting lab appt COVID-19 Education: Signs and symptoms of COVID-19 infection were discussed with pt and how to seek care for testing.  The importance of following the Stay at Home order, and when out- Social Distancing and wearing a facial mask were discussed today. Advised that if she visits her family (that does not live with her) NOT to hug or get within 6 feet of them  I have personally reviewed and noted the following in the patient's chart:   . Medical and social history . Use of alcohol, tobacco or illicit drugs  . Current medications and supplements . Functional ability and status . Nutritional status . Physical activity . Advanced directives . List of other physicians . Hospitalizations, surgeries, and ER visits in previous 12 months . Vitals . Screenings to include cognitive, depression, and falls . Referrals and appointments  In addition, I have reviewed and discussed with patient certain preventive protocols, quality metrics, and best practice recommendations. A written personalized care plan for preventive services as well as general preventive health recommendations were provided to patient.   I discussed the assessment and treatment plan with the patient. The patient was provided an opportunity to ask questions and all were answered. The patient agreed with the plan and demonstrated an understanding of the instructions.   The patient was advised to call back or seek an in-person evaluation if the symptoms worsen or if the condition fails to improve as anticipated.  Julaine FusiKaty D , NP  01/21/2019

## 2019-01-21 ENCOUNTER — Encounter: Payer: Self-pay | Admitting: Adult Health

## 2019-01-21 ENCOUNTER — Other Ambulatory Visit: Payer: Self-pay

## 2019-01-21 ENCOUNTER — Ambulatory Visit (INDEPENDENT_AMBULATORY_CARE_PROVIDER_SITE_OTHER): Payer: Medicare Other | Admitting: Adult Health

## 2019-01-21 VITALS — BP 139/97 | HR 62 | Temp 96.4°F | Ht 68.0 in | Wt 231.0 lb

## 2019-01-21 DIAGNOSIS — E038 Other specified hypothyroidism: Secondary | ICD-10-CM

## 2019-01-21 DIAGNOSIS — I10 Essential (primary) hypertension: Secondary | ICD-10-CM | POA: Diagnosis not present

## 2019-01-21 DIAGNOSIS — E039 Hypothyroidism, unspecified: Secondary | ICD-10-CM | POA: Diagnosis not present

## 2019-01-21 DIAGNOSIS — R5383 Other fatigue: Secondary | ICD-10-CM

## 2019-01-21 DIAGNOSIS — Z23 Encounter for immunization: Secondary | ICD-10-CM

## 2019-01-21 DIAGNOSIS — Z0000 Encounter for general adult medical examination without abnormal findings: Secondary | ICD-10-CM | POA: Diagnosis not present

## 2019-01-21 DIAGNOSIS — R7989 Other specified abnormal findings of blood chemistry: Secondary | ICD-10-CM | POA: Diagnosis not present

## 2019-01-21 DIAGNOSIS — E785 Hyperlipidemia, unspecified: Secondary | ICD-10-CM

## 2019-01-21 MED ORDER — ZOSTER VAC RECOMB ADJUVANTED 50 MCG/0.5ML IM SUSR: 0.5000 mL | Freq: Once | INTRAMUSCULAR | 0 refills | Status: AC

## 2019-01-21 NOTE — Assessment & Plan Note (Signed)
Continue all medications as directed. Remain well hydrated, follow Mediterranean Diet- reduce CHO snacking 6 week-  Fasting lab appt COVID-19 Education: Signs and symptoms of COVID-19 infection were discussed with pt and how to seek care for testing.  The importance of following the Stay at Home order, and when out- Social Distancing and wearing a facial mask were discussed today. Advised that if she visits her family (that does not live with her) NOT to hug or get within 6 feet of them  I have personally reviewed and noted the following in the patient's chart:   . Medical and social history . Use of alcohol, tobacco or illicit drugs  . Current medications and supplements . Functional ability and status . Nutritional status . Physical activity . Advanced directives . List of other physicians . Hospitalizations, surgeries, and ER visits in previous 12 months . Vitals . Screenings to include cognitive, depression, and falls . Referrals and appointments  In addition, I have reviewed and discussed with patient certain preventive protocols, quality metrics, and best practice recommendations. A written personalized care plan for preventive services as well as general preventive health recommendations were provided to patient.   I discussed the assessment and treatment plan with the patient. The patient was provided an opportunity to ask questions and all were answered. The patient agreed with the plan and demonstrated an understanding of the instructions.   The patient was advised to call back or seek an in-person evaluation if the symptoms

## 2019-02-28 DIAGNOSIS — Z20828 Contact with and (suspected) exposure to other viral communicable diseases: Secondary | ICD-10-CM | POA: Diagnosis not present

## 2019-03-06 ENCOUNTER — Other Ambulatory Visit: Payer: Self-pay

## 2019-03-06 ENCOUNTER — Other Ambulatory Visit: Payer: Medicare Other

## 2019-03-06 DIAGNOSIS — I10 Essential (primary) hypertension: Secondary | ICD-10-CM | POA: Diagnosis not present

## 2019-03-06 DIAGNOSIS — E785 Hyperlipidemia, unspecified: Secondary | ICD-10-CM

## 2019-03-06 DIAGNOSIS — E038 Other specified hypothyroidism: Secondary | ICD-10-CM

## 2019-03-06 DIAGNOSIS — R5383 Other fatigue: Secondary | ICD-10-CM | POA: Diagnosis not present

## 2019-03-06 DIAGNOSIS — R7989 Other specified abnormal findings of blood chemistry: Secondary | ICD-10-CM | POA: Diagnosis not present

## 2019-03-06 DIAGNOSIS — Z0000 Encounter for general adult medical examination without abnormal findings: Secondary | ICD-10-CM | POA: Diagnosis not present

## 2019-03-06 DIAGNOSIS — E039 Hypothyroidism, unspecified: Secondary | ICD-10-CM

## 2019-03-07 LAB — CBC WITH DIFFERENTIAL/PLATELET
Basophils Absolute: 0.1 10*3/uL (ref 0.0–0.2)
Basos: 1 %
EOS (ABSOLUTE): 0.2 10*3/uL (ref 0.0–0.4)
Eos: 3 %
Hematocrit: 42.9 % (ref 34.0–46.6)
Hemoglobin: 14.2 g/dL (ref 11.1–15.9)
Immature Grans (Abs): 0 10*3/uL (ref 0.0–0.1)
Immature Granulocytes: 0 %
Lymphocytes Absolute: 2.3 10*3/uL (ref 0.7–3.1)
Lymphs: 30 %
MCH: 29.4 pg (ref 26.6–33.0)
MCHC: 33.1 g/dL (ref 31.5–35.7)
MCV: 89 fL (ref 79–97)
Monocytes Absolute: 0.6 10*3/uL (ref 0.1–0.9)
Monocytes: 8 %
Neutrophils Absolute: 4.4 10*3/uL (ref 1.4–7.0)
Neutrophils: 58 %
Platelets: 313 10*3/uL (ref 150–450)
RBC: 4.83 x10E6/uL (ref 3.77–5.28)
RDW: 12.9 % (ref 11.7–15.4)
WBC: 7.6 10*3/uL (ref 3.4–10.8)

## 2019-03-07 LAB — COMPREHENSIVE METABOLIC PANEL
ALT: 13 IU/L (ref 0–32)
AST: 16 IU/L (ref 0–40)
Albumin/Globulin Ratio: 1.4 (ref 1.2–2.2)
Albumin: 4.1 g/dL (ref 3.7–4.7)
Alkaline Phosphatase: 98 IU/L (ref 39–117)
BUN/Creatinine Ratio: 13 (ref 12–28)
BUN: 10 mg/dL (ref 8–27)
Bilirubin Total: 0.4 mg/dL (ref 0.0–1.2)
CO2: 23 mmol/L (ref 20–29)
Calcium: 9 mg/dL (ref 8.7–10.3)
Chloride: 108 mmol/L — ABNORMAL HIGH (ref 96–106)
Creatinine, Ser: 0.75 mg/dL (ref 0.57–1.00)
GFR calc Af Amer: 93 mL/min/{1.73_m2} (ref >59)
GFR calc non Af Amer: 80 mL/min/{1.73_m2} (ref >59)
Globulin, Total: 2.9 g/dL (ref 1.5–4.5)
Glucose: 100 mg/dL — ABNORMAL HIGH (ref 65–99)
Potassium: 4.5 mmol/L (ref 3.5–5.2)
Sodium: 144 mmol/L (ref 134–144)
Total Protein: 7 g/dL (ref 6.0–8.5)

## 2019-03-07 LAB — HEMOGLOBIN A1C
Est. average glucose Bld gHb Est-mCnc: 117 mg/dL
Hgb A1c MFr Bld: 5.7 % — ABNORMAL HIGH (ref 4.8–5.6)

## 2019-03-07 LAB — LIPID PANEL
Chol/HDL Ratio: 3 ratio (ref 0.0–4.4)
Cholesterol, Total: 157 mg/dL (ref 100–199)
HDL: 52 mg/dL (ref >39)
LDL Calculated: 74 mg/dL (ref 0–99)
Triglycerides: 156 mg/dL — ABNORMAL HIGH (ref 0–149)
VLDL Cholesterol Cal: 31 mg/dL (ref 5–40)

## 2019-03-07 LAB — TSH: TSH: 4.14 u[IU]/mL (ref 0.450–4.500)

## 2019-03-09 ENCOUNTER — Other Ambulatory Visit: Payer: Self-pay | Admitting: Adult Health

## 2019-04-21 ENCOUNTER — Other Ambulatory Visit: Payer: Self-pay | Admitting: Adult Health

## 2019-06-10 ENCOUNTER — Other Ambulatory Visit: Payer: Self-pay | Admitting: Cardiology

## 2019-07-16 ENCOUNTER — Other Ambulatory Visit: Payer: Self-pay

## 2019-07-16 ENCOUNTER — Encounter: Payer: Self-pay | Admitting: Cardiology

## 2019-07-16 ENCOUNTER — Ambulatory Visit (INDEPENDENT_AMBULATORY_CARE_PROVIDER_SITE_OTHER): Payer: Medicare Other | Admitting: Cardiology

## 2019-07-16 ENCOUNTER — Other Ambulatory Visit: Payer: Self-pay | Admitting: Adult Health

## 2019-07-16 VITALS — BP 142/74 | HR 70 | Ht 68.0 in | Wt 242.6 lb

## 2019-07-16 DIAGNOSIS — I10 Essential (primary) hypertension: Secondary | ICD-10-CM | POA: Diagnosis not present

## 2019-07-16 DIAGNOSIS — R001 Bradycardia, unspecified: Secondary | ICD-10-CM | POA: Diagnosis not present

## 2019-07-16 DIAGNOSIS — I493 Ventricular premature depolarization: Secondary | ICD-10-CM | POA: Diagnosis not present

## 2019-07-16 MED ORDER — LOSARTAN POTASSIUM 100 MG PO TABS: 100.0000 mg | ORAL_TABLET | Freq: Every day | ORAL | 0 refills | Status: DC

## 2019-07-16 MED ORDER — FUROSEMIDE 20 MG PO TABS: 20.0000 mg | ORAL_TABLET | Freq: Every day | ORAL | 11 refills | Status: DC

## 2019-07-16 MED ORDER — AMLODIPINE BESYLATE 5 MG PO TABS: 7.5000 mg | ORAL_TABLET | Freq: Every day | ORAL | 3 refills | Status: DC

## 2019-07-16 NOTE — Telephone Encounter (Signed)
Pt had Medicare Wellness exam on 01/21/2019 has not been since that time.  Only f/u documented was for 1 year for CPE/Medicare wellness.  Does pt need OV prior to refilling this medication?  Please review and refill if appropriate.  Charyl Bigger, CMA

## 2019-07-16 NOTE — Telephone Encounter (Signed)
Needs OV.  

## 2019-07-16 NOTE — Progress Notes (Signed)
Cardiology Office Note:    Date:  07/16/2019   ID:  [name], DOB [birth year], MRN [id number]  PCP:  Julaine Fusi, NP  Cardiologist:  No primary care provider on file.    Referring MD: Julaine Fusi, NP   Chief Complaint  Patient presents with  . Follow-up    Bradycardia, HTN, PACs and PVCs    History of Present Illness:    [name] is a 73 y.o. female with a hx of CHF, depression, GERD, hyperlipidemia and hypertension who was initially referred for evaluation of bradycardia.  Her heart rate was in the 50-60 range when document of by her nurse practitioner.  Her mother had a cardiac history in the past with CAD and prior stenting. When I saw her a year ago she was asymptomatic with her bradycardia and was not on any AVN or rate slowing meds.  Holter monitor showed NSR with average HR 61bpm and occasional PVCs and PACs.  ETT showed no ischemia.  Her Bp was elevated at that time and her Losartan was increased to 100mg  daily.    She is here today for followup and is doing well.  She denies any chest pain or pressure, SOB, DOE, PND, orthopnea, LE edema, dizziness, palpitations or syncope. She is compliant with her meds and is tolerating meds with no SE.    Past Medical History:  Diagnosis Date  . Anxiety   . Arthritis   . Cataract   . Chronic headaches   . Congestive heart failure (CHF) (HCC)   . Depression   . GERD (gastroesophageal reflux disease)   . H/O blood clots    "back leg saddle block"  . Hyperlipidemia   . Hypertension   . Imbalance   . 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 Neuropathy   . Obesity   . Sleep apnea     Past Surgical History:  Procedure Laterality Date  . ABDOMINAL HYSTERECTOMY    . left arm and wrist surgery - broken    . leg reduction      Current Medications: Current Meds  Medication Sig  . amLODipine (NORVASC) 5 MG tablet Take 7.5 mg by mouth daily.  atorvastatin (LIPITOR) 20 MG tablet TAKE 1  TABLET BY MOUTH DAILY.  Marland Kitchen gabapentin (NEURONTIN) 300 MG capsule Take 1 capsule (300 mg total) by mouth 3 (three) times daily.  Marland Kitchen losartan (COZAAR) 100 MG tablet Take 1 tablet (100 mg total) by mouth daily. Please make annual appt with Dr. Marland Kitchen for future refills. [phone]. 1st attempt.  [phone] omeprazole (PRILOSEC) 20 MG capsule TAKE 1 CAPSULE BY MOUTH DAILY.  Marland Kitchen sertraline (ZOLOFT) 50 MG tablet TAKE 1 TABLET BY MOUTH DAILY.  . SUMAtriptan-naproxen (TREXIMET) 85-500 MG tablet TAKE 1 TABLET BY MOUTH EVERY 2 HOURS AS NEEDED FOR MIGRAINE. MAX 2 TABLETS IN 24 HOURS.  Marland Kitchen topiramate (TOPAMAX) 50 MG tablet One po qam and then 2 tabs qhs  . [DISCONTINUED] amLODipine (NORVASC) 5 MG tablet Take 1.5 tablets (7.5 mg total) by mouth daily.     Allergies:   Contrast media [iodinated diagnostic agents], Dye fdc red [red dye], Latex, Other, and Tuna oil [fish oil]   Social History   Socioeconomic History  . Marital status: Widowed    Spouse name: Not on file  . Number of children: 2  . Years of education: some college  . Highest education level: Not on file  Occupational History  . Occupation: Retired  Social Needs  . Financial resource strain: Not on file  . Food insecurity    Worry: Not on file    Inability: Not on file  . Transportation needs    Medical: Not on file    Non-medical: Not on file  Tobacco Use  . Smoking status: Never Smoker  . Smokeless tobacco: Never Used  Substance and Sexual Activity  . Alcohol use: No  . Drug use: No  . Sexual activity: Not on file  Lifestyle  . Physical activity    Days per week: Not on file    Minutes per session: Not on file  . Stress: Not on file  Relationships  . Social Herbalist on phone: Not on file    Gets together: Not on file    Attends religious service: Not on file    Active member of club or organization: Not on file    Attends meetings of clubs or organizations: Not on file    Relationship status: Not on file  Other  Topics Concern  . Not on file  Social History Narrative   Lives alone.   Right-handed.   1-2 cups caffeine daily.     Family History: The patient's family history includes Alcohol abuse in her father; Colon cancer in her maternal uncle; Diabetes in her maternal grandmother and mother; Hyperlipidemia in her brother, mother, and sister; Hypertension in her brother, brother, and mother; Irritable bowel syndrome in her child; Parkinson's disease in her maternal grandmother and mother.  ROS:   Please see the history of present illness.    ROS  All other systems reviewed and negative.   EKGs/Labs/Other Studies Reviewed:    The following studies were reviewed today: none  EKG:  EKG is not  ordered today.    Recent Labs: 03/06/2019: ALT 13; BUN 10; Creatinine, Ser 0.75; Hemoglobin 14.2; Platelets 313; Potassium 4.5; Sodium 144; TSH 4.140   Recent Lipid Panel    Component Value Date/Time   CHOL 157 03/06/2019 0918   TRIG 156 (H) 03/06/2019 0918   HDL 52 03/06/2019 0918   CHOLHDL 3.0 03/06/2019 0918   LDLCALC 74 03/06/2019 0918    Physical Exam:    VS:  BP (!) 142/74   Pulse 70   Ht 5\' 8"  (1.727 m)   Wt 242 lb 9.6 oz (110 kg)   SpO2 96%   BMI 36.89 kg/m     Wt Readings from Last 3 Encounters:  07/16/19 242 lb 9.6 oz (110 kg)  01/21/19 231 lb (104.8 kg)  07/24/18 235 lb 8 oz (106.8 kg)     GEN:  Well nourished, well developed in no acute distress HEENT: Normal NECK: No JVD; No carotid bruits LYMPHATICS: No lymphadenopathy CARDIAC: RRR, no murmurs, rubs, gallops RESPIRATORY:  Clear to auscultation without rales, wheezing or rhonchi  ABDOMEN: Soft, non-tender, non-distended MUSCULOSKELETAL:  No edema; No deformity  SKIN: Warm and dry NEUROLOGIC:  Alert and oriented x 3 PSYCHIATRIC:  Normal affect   ASSESSMENT:    1. Essential hypertension   2. Bradycardia   3. PVC's (premature ventricular contractions)    PLAN:    In order of problems listed above:  1.  HTN  -Bp controlled -continue Losartan 100mg  daily and amlodipine 7.5mg  daily -Creatinine normal at 0.75 in June  2.  Asymptomatic bradycardia -average HR on holter was 61bpm -she denies any dizziness, syncope or fatigue  3.  PVCs -asymptomatic  4.  LE edema -this occurs almost daily -  she avoids added Na in her diet -start lasix 20mg  daily (allergic to red dye in HCTZ) -check BMET in 1 wee   Medication Adjustments/Labs and Tests Ordered: Current medicines are reviewed at length with the patient today.  Concerns regarding medicines are outlined above.  No orders of the defined types were placed in this encounter.  No orders of the defined types were placed in this encounter.   Signed, Armanda Magicraci Chirsty Armistead, MD  07/16/2019 3:57 PM    [facility]

## 2019-07-16 NOTE — Telephone Encounter (Signed)
Please call pt to schedule follow up.  Charyl Bigger, CMA

## 2019-07-16 NOTE — Telephone Encounter (Signed)
Needs OV- can either be OV or TeleMed Thanks! Valetta Fuller

## 2019-07-16 NOTE — Patient Instructions (Addendum)
Medication Instructions:  Your physician has recommended you make the following change in your medication:  START Furosemide (Lasix) 20 mg once daily in the morning  *If you need a refill on your cardiac medications before your next appointment, please call your pharmacy*  Lab Work: Your physician recommends that you return for lab work in: 1 week for basic metabolic panel on Thursday Oct. 29 You may come in between the hours of 7:30 am and 4:45 pm and you do not have to fast for the appointment  If you have labs (blood work) drawn today and your tests are completely normal, you will receive your results only by: Marland Kitchen MyChart Message (if you have MyChart) OR . A paper copy in the mail If you have any lab test that is abnormal or we need to change your treatment, we will call you to review the results.   Testing/Procedures: None Ordered   Follow-Up: At [facility], you and your health needs are our priority.  As part of our continuing mission to provide you with exceptional heart care, we have created designated Provider Care Teams.  These Care Teams include your primary Cardiologist (physician) and Advanced Practice Providers (APPs -  Physician Assistants and Nurse Practitioners) who all work together to provide you with the care you need, when you need it.  Your next appointment:   4-6 weeks on Tuesday Nov. 24 with Melina Copa, PA at 8:30 am  The format for your next appointment:   Virtual  Provider:   Melina Copa, PA-C

## 2019-07-24 ENCOUNTER — Other Ambulatory Visit: Payer: Medicare Other

## 2019-08-01 ENCOUNTER — Other Ambulatory Visit: Payer: Self-pay

## 2019-08-01 ENCOUNTER — Other Ambulatory Visit: Payer: Medicare Other | Admitting: *Deleted

## 2019-08-01 DIAGNOSIS — I493 Ventricular premature depolarization: Secondary | ICD-10-CM | POA: Diagnosis not present

## 2019-08-01 DIAGNOSIS — R001 Bradycardia, unspecified: Secondary | ICD-10-CM

## 2019-08-01 DIAGNOSIS — I10 Essential (primary) hypertension: Secondary | ICD-10-CM | POA: Diagnosis not present

## 2019-08-02 LAB — BASIC METABOLIC PANEL
BUN/Creatinine Ratio: 11 — ABNORMAL LOW (ref 12–28)
BUN: 9 mg/dL (ref 8–27)
CO2: 24 mmol/L (ref 20–29)
Calcium: 9.3 mg/dL (ref 8.7–10.3)
Chloride: 104 mmol/L (ref 96–106)
Creatinine, Ser: 0.84 mg/dL (ref 0.57–1.00)
GFR calc Af Amer: 81 mL/min/{1.73_m2} (ref >59)
GFR calc non Af Amer: 70 mL/min/{1.73_m2} (ref >59)
Glucose: 95 mg/dL (ref 65–99)
Potassium: 4 mmol/L (ref 3.5–5.2)
Sodium: 142 mmol/L (ref 134–144)

## 2019-08-04 ENCOUNTER — Telehealth: Payer: Self-pay

## 2019-08-04 NOTE — Telephone Encounter (Signed)
Notes recorded by Frederik Schmidt, RN on 08/04/2019 at 8:42 AM EST  The patient has been notified of the result and verbalized understanding. All questions (if any) were answered.  Frederik Schmidt, RN 08/04/2019 8:42 AM

## 2019-08-04 NOTE — Telephone Encounter (Signed)
-----   Message from Sueanne Margarita, MD sent at 08/04/2019  8:35 AM EST ----- Please let patient know that labs were normal.  Continue current medical therapy.

## 2019-08-18 ENCOUNTER — Other Ambulatory Visit: Payer: Self-pay | Admitting: Cardiology

## 2019-08-18 ENCOUNTER — Encounter: Payer: Self-pay | Admitting: Physician Assistant
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
## 2019-08-19 ENCOUNTER — Encounter: Payer: Self-pay | Admitting: Physician Assistant

## 2019-08-19 ENCOUNTER — Other Ambulatory Visit: Payer: Self-pay

## 2019-08-19 ENCOUNTER — Telehealth: Payer: Self-pay

## 2019-08-19 ENCOUNTER — Telehealth (INDEPENDENT_AMBULATORY_CARE_PROVIDER_SITE_OTHER): Payer: Medicare Other | Admitting: Physician Assistant

## 2019-08-19 VITALS — BP 136/77 | HR 76 | Ht 68.0 in | Wt 240.0 lb

## 2019-08-19 DIAGNOSIS — R600 Localized edema: Secondary | ICD-10-CM | POA: Diagnosis not present

## 2019-08-19 DIAGNOSIS — I10 Essential (primary) hypertension: Secondary | ICD-10-CM

## 2019-08-19 DIAGNOSIS — I493 Ventricular premature depolarization: Secondary | ICD-10-CM

## 2019-08-19 DIAGNOSIS — I491 Atrial premature depolarization: Secondary | ICD-10-CM

## 2019-08-19 DIAGNOSIS — I50810 Right heart failure, unspecified: Secondary | ICD-10-CM

## 2019-08-19 DIAGNOSIS — R001 Bradycardia, unspecified: Secondary | ICD-10-CM

## 2019-08-19 DIAGNOSIS — G4733 Obstructive sleep apnea (adult) (pediatric): Secondary | ICD-10-CM

## 2019-08-19 MED ORDER — FUROSEMIDE 20 MG PO TABS: 20.0000 mg | ORAL_TABLET | Freq: Every day | ORAL | 3 refills | Status: DC

## 2019-08-19 MED ORDER — LOSARTAN POTASSIUM 100 MG PO TABS: 100.0000 mg | ORAL_TABLET | Freq: Every day | ORAL | 2 refills | Status: DC

## 2019-08-19 NOTE — Telephone Encounter (Signed)

## 2019-08-19 NOTE — Patient Instructions (Signed)
Medication Instructions:  Your physician recommends that you continue on your current medications as directed. Please refer to the Current Medication list given to you today.  *If you need a refill on your cardiac medications before your next appointment, please call your pharmacy*  Lab Work: None ordered  If you have labs (blood work) drawn today and your tests are completely normal, you will receive your results only by: Marland Kitchen MyChart Message (if you have MyChart) OR . A paper copy in the mail If you have any lab test that is abnormal or we need to change your treatment, we will call you to review the results.  Testing/Procedures: None ordered  Follow-Up: At [facility], you and your health needs are our priority.  As part of our continuing mission to provide you with exceptional heart care, we have created designated Provider Care Teams.  These Care Teams include your primary Cardiologist (physician) and Advanced Practice Providers (APPs -  Physician Assistants and Nurse Practitioners) who all work together to provide you with the care you need, when you need it.  Your next appointment:   12 month(s)  The format for your next appointment:   In Person  Provider:   You may see Fransico Him, MD or one of the following Advanced Practice Providers on your designated Care Team:    Melina Copa, PA-C  Ermalinda Barrios, PA-C   Other Instructions  Continue your present regimen as outlined.  If your swelling returns or worsens, please call us.  Please monitor your blood pressure periodically at home at least 3 hours after taking your medicines and call us if it tends to run greater than 140 on the top number

## 2019-08-28 ENCOUNTER — Telehealth: Payer: Self-pay

## 2019-08-28 ENCOUNTER — Other Ambulatory Visit: Payer: Self-pay | Admitting: Adult Health

## 2019-08-28 NOTE — Telephone Encounter (Signed)
Please call pt to schedule appt.  No further refills until pt is seen.  T. Nelson, CMA  

## 2019-09-05 ENCOUNTER — Ambulatory Visit: Payer: Medicare Other | Admitting: Physician Assistant
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
## 2019-10-02 ENCOUNTER — Encounter: Payer: Self-pay | Admitting: Adult Health

## 2019-10-02 ENCOUNTER — Ambulatory Visit (INDEPENDENT_AMBULATORY_CARE_PROVIDER_SITE_OTHER): Payer: Medicare Other | Admitting: Adult Health

## 2019-10-02 ENCOUNTER — Other Ambulatory Visit: Payer: Self-pay

## 2019-10-02 DIAGNOSIS — Z0000 Encounter for general adult medical examination without abnormal findings: Secondary | ICD-10-CM | POA: Diagnosis not present

## 2019-10-02 DIAGNOSIS — G629 Polyneuropathy, unspecified: Secondary | ICD-10-CM

## 2019-10-02 DIAGNOSIS — E785 Hyperlipidemia, unspecified: Secondary | ICD-10-CM

## 2019-10-02 DIAGNOSIS — I10 Essential (primary) hypertension: Secondary | ICD-10-CM

## 2019-10-02 MED ORDER — ATORVASTATIN CALCIUM 20 MG PO TABS: 20.0000 mg | ORAL_TABLET | Freq: Every day | ORAL | 0 refills | Status: DC

## 2019-10-02 MED ORDER — SERTRALINE HCL 50 MG PO TABS: 50.0000 mg | ORAL_TABLET | Freq: Every day | ORAL | 0 refills | Status: DC

## 2019-10-02 MED ORDER — OMEPRAZOLE 20 MG PO CPDR: 20.0000 mg | DELAYED_RELEASE_CAPSULE | Freq: Every day | ORAL | 0 refills | Status: DC

## 2019-10-02 NOTE — Assessment & Plan Note (Signed)
03/06/2019 CMP-stable Lipids  Total- 157 TGs- 156 HDL-52 LDL-74 Currently on Atorvastatin 20mg  QD

## 2019-10-02 NOTE — Assessment & Plan Note (Signed)
Ambulatory BP SBP 130-140s DBP 70-80 She denies chest pain/chest tightness with exertion Cardiology recently added on Lasix, which has helped normalize her BP and she states "now I can see my ankles". She was continued on Losartan 100mg  QD, Amlodipine 5mg  (1.5mg  tablets per day)

## 2019-10-02 NOTE — Assessment & Plan Note (Signed)
Neurontin 300mg  cap TID Followed by Neurology

## 2019-10-02 NOTE — Assessment & Plan Note (Signed)
Assessment and Plan: Labs reviewed from 02/2019- stable Medications refilled. She declined [facility] referral. If you ever have serious thoughts of harming yourself/others- seek immediate medical assistance- she verbalized understanding/agreement. Continue regular treadmill walking/ Remain well hydrated, follow heart healthy diet. Continue follow-up with Cardiology and Neurology as directed. Continue to social distance and wear a mask when in public  Follow Up Instructions: 6 months OV with fasting labs   I discussed the assessment and treatment plan with the patient. The patient was provided an opportunity to ask questions and all were answered. The patient agreed with the plan and demonstrated an understanding of the instructions.   The patient was advised to call back or seek an in-person evaluation if the symptoms worsen or if the condition fails to improve as anticipated.

## 2019-10-15 ENCOUNTER — Other Ambulatory Visit: Payer: Self-pay | Admitting: Neurology

## 2019-11-09 ENCOUNTER — Ambulatory Visit: Payer: Medicare Other | Attending: Internal Medicine

## 2019-11-09 DIAGNOSIS — Z23 Encounter for immunization: Secondary | ICD-10-CM | POA: Insufficient documentation

## 2019-11-09 NOTE — Progress Notes (Signed)
   Covid-19 Vaccination Clinic  Name:  [name]    MRN: [id number] DOB: June 28, 1946  11/09/2019  [name] was observed post Covid-19 immunization for 15 minutes without incidence. She was provided with Vaccine Information Sheet and instruction to access the V-Safe system.   [name] was instructed to call 911 with any severe reactions post vaccine: Marland Kitchen Difficulty breathing  . Swelling of your face and throat  . A fast heartbeat  . A bad rash all over your body  . Dizziness and weakness    Immunizations Administered    Name Date Dose VIS Date Route   Pfizer COVID-19 Vaccine 11/09/2019 12:00 PM 0.3 mL 09/05/2019 Intramuscular   Manufacturer: [employer], [employer]   Lot: [id number]   NDC: [address]-4

## 2019-12-02 ENCOUNTER — Ambulatory Visit: Payer: Medicare Other | Attending: Internal Medicine

## 2019-12-02 DIAGNOSIS — Z23 Encounter for immunization: Secondary | ICD-10-CM | POA: Insufficient documentation

## 2019-12-02 NOTE — Progress Notes (Signed)
   Covid-19 Vaccination Clinic  Name:  [name]    MRN: [id number] DOB: 1946/03/17  12/02/2019  [name] was observed post Covid-19 immunization for 30 minutes based on pre-vaccination screening without incident. She was provided with Vaccine Information Sheet and instruction to access the V-Safe system.   [name] was instructed to call 911 with any severe reactions post vaccine: Marland Kitchen Difficulty breathing  . Swelling of face and throat  . A fast heartbeat  . A bad rash all over body  . Dizziness and weakness   Immunizations Administered    Name Date Dose VIS Date Route   Pfizer COVID-19 Vaccine 12/02/2019  3:39 PM 0.3 mL 09/05/2019 Intramuscular   Manufacturer: [employer], [employer]   Lot: [id number]   NDC: [address]-8

## 2019-12-22 ENCOUNTER — Other Ambulatory Visit: Payer: Self-pay

## 2019-12-22 ENCOUNTER — Ambulatory Visit (INDEPENDENT_AMBULATORY_CARE_PROVIDER_SITE_OTHER): Payer: Medicare Other | Admitting: Neurology

## 2019-12-22 ENCOUNTER — Encounter: Payer: Self-pay | Admitting: Neurology

## 2019-12-22 VITALS — BP 149/85 | HR 61 | Temp 97.2°F | Ht 68.0 in | Wt 241.2 lb

## 2019-12-22 DIAGNOSIS — G43909 Migraine, unspecified, not intractable, without status migrainosus: Secondary | ICD-10-CM | POA: Diagnosis not present

## 2019-12-22 DIAGNOSIS — G629 Polyneuropathy, unspecified: Secondary | ICD-10-CM | POA: Diagnosis not present

## 2019-12-22 MED ORDER — GABAPENTIN 300 MG PO CAPS: 300.0000 mg | ORAL_CAPSULE | Freq: Three times a day (TID) | ORAL | 3 refills | Status: DC

## 2019-12-22 MED ORDER — NORTRIPTYLINE HCL 10 MG PO CAPS: 10.0000 mg | ORAL_CAPSULE | Freq: Every day | ORAL | 3 refills | Status: DC

## 2019-12-22 MED ORDER — TOPIRAMATE 50 MG PO TABS: ORAL_TABLET | ORAL | 3 refills | Status: DC
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
## 2019-12-22 NOTE — Progress Notes (Signed)
I have reviewed and agreed above plan. 

## 2019-12-22 NOTE — Patient Instructions (Signed)
Continue taking gabapentin, topamax Try nortriptyline 10 mg at bedtime for neuropathy pain  See you back in 1 year or sooner if needed Let me know how you like the nortriptyline

## 2019-12-29 ENCOUNTER — Ambulatory Visit: Payer: Self-pay | Admitting: Neurology

## 2020-01-05 ENCOUNTER — Other Ambulatory Visit: Payer: Self-pay | Admitting: Adult Health

## 2020-01-19 ENCOUNTER — Other Ambulatory Visit: Payer: Self-pay | Admitting: Adult Health

## 2020-02-20 ENCOUNTER — Other Ambulatory Visit: Payer: Self-pay | Admitting: Physician Assistant

## 2020-02-20 DIAGNOSIS — E559 Vitamin D deficiency, unspecified: Secondary | ICD-10-CM

## 2020-02-20 DIAGNOSIS — E785 Hyperlipidemia, unspecified: Secondary | ICD-10-CM

## 2020-02-20 DIAGNOSIS — I10 Essential (primary) hypertension: Secondary | ICD-10-CM

## 2020-02-20 DIAGNOSIS — E038 Other specified hypothyroidism: Secondary | ICD-10-CM

## 2020-02-20 DIAGNOSIS — R5383 Other fatigue: Secondary | ICD-10-CM

## 2020-02-20 DIAGNOSIS — R7989 Other specified abnormal findings of blood chemistry: Secondary | ICD-10-CM

## 2020-02-20 DIAGNOSIS — Z0000 Encounter for general adult medical examination without abnormal findings: Secondary | ICD-10-CM

## 2020-02-25 ENCOUNTER — Other Ambulatory Visit: Payer: Self-pay

## 2020-02-25 ENCOUNTER — Other Ambulatory Visit: Payer: Medicare Other

## 2020-02-25 DIAGNOSIS — I10 Essential (primary) hypertension: Secondary | ICD-10-CM

## 2020-02-25 DIAGNOSIS — R7989 Other specified abnormal findings of blood chemistry: Secondary | ICD-10-CM

## 2020-02-25 DIAGNOSIS — Z0000 Encounter for general adult medical examination without abnormal findings: Secondary | ICD-10-CM | POA: Diagnosis not present

## 2020-02-25 DIAGNOSIS — R5383 Other fatigue: Secondary | ICD-10-CM

## 2020-02-25 DIAGNOSIS — E038 Other specified hypothyroidism: Secondary | ICD-10-CM

## 2020-02-25 DIAGNOSIS — E785 Hyperlipidemia, unspecified: Secondary | ICD-10-CM

## 2020-02-25 DIAGNOSIS — E559 Vitamin D deficiency, unspecified: Secondary | ICD-10-CM

## 2020-02-25 DIAGNOSIS — E039 Hypothyroidism, unspecified: Secondary | ICD-10-CM | POA: Diagnosis not present

## 2020-02-26 LAB — COMPREHENSIVE METABOLIC PANEL
ALT: 13 IU/L (ref 0–32)
AST: 16 IU/L (ref 0–40)
Albumin/Globulin Ratio: 1.3 (ref 1.2–2.2)
Albumin: 3.9 g/dL (ref 3.7–4.7)
Alkaline Phosphatase: 123 IU/L — ABNORMAL HIGH (ref 48–121)
BUN/Creatinine Ratio: 16 (ref 12–28)
BUN: 14 mg/dL (ref 8–27)
Bilirubin Total: 0.3 mg/dL (ref 0.0–1.2)
CO2: 22 mmol/L (ref 20–29)
Calcium: 8.9 mg/dL (ref 8.7–10.3)
Chloride: 105 mmol/L (ref 96–106)
Creatinine, Ser: 0.85 mg/dL (ref 0.57–1.00)
GFR calc Af Amer: 79 mL/min/{1.73_m2} (ref >59)
GFR calc non Af Amer: 69 mL/min/{1.73_m2} (ref >59)
Globulin, Total: 2.9 g/dL (ref 1.5–4.5)
Glucose: 87 mg/dL (ref 65–99)
Potassium: 4.4 mmol/L (ref 3.5–5.2)
Sodium: 141 mmol/L (ref 134–144)
Total Protein: 6.8 g/dL (ref 6.0–8.5)

## 2020-02-26 LAB — HEMOGLOBIN A1C
Est. average glucose Bld gHb Est-mCnc: 120 mg/dL
Hgb A1c MFr Bld: 5.8 % — ABNORMAL HIGH (ref 4.8–5.6)

## 2020-02-26 LAB — TSH: TSH: 3.97 u[IU]/mL (ref 0.450–4.500)

## 2020-02-26 LAB — LIPID PANEL
Chol/HDL Ratio: 3.2 ratio (ref 0.0–4.4)
Cholesterol, Total: 157 mg/dL (ref 100–199)
HDL: 49 mg/dL (ref >39)
LDL Chol Calc (NIH): 80 mg/dL (ref 0–99)
Triglycerides: 163 mg/dL — ABNORMAL HIGH (ref 0–149)
VLDL Cholesterol Cal: 28 mg/dL (ref 5–40)

## 2020-02-26 LAB — CBC
Hematocrit: 44 % (ref 34.0–46.6)
Hemoglobin: 14.1 g/dL (ref 11.1–15.9)
MCH: 28.5 pg (ref 26.6–33.0)
MCHC: 32 g/dL (ref 31.5–35.7)
MCV: 89 fL (ref 79–97)
Platelets: 303 10*3/uL (ref 150–450)
RBC: 4.94 x10E6/uL (ref 3.77–5.28)
RDW: 13.1 % (ref 11.7–15.4)
WBC: 7.8 10*3/uL (ref 3.4–10.8)

## 2020-02-26 LAB — VITAMIN D 25 HYDROXY (VIT D DEFICIENCY, FRACTURES): Vit D, 25-Hydroxy: 35.3 ng/mL (ref 30.0–100.0)

## 2020-03-01 ENCOUNTER — Other Ambulatory Visit: Payer: Self-pay

## 2020-03-01 ENCOUNTER — Telehealth: Payer: Self-pay | Admitting: Physician Assistant

## 2020-03-01 ENCOUNTER — Encounter: Payer: Self-pay | Admitting: Physician Assistant

## 2020-03-01 ENCOUNTER — Ambulatory Visit (INDEPENDENT_AMBULATORY_CARE_PROVIDER_SITE_OTHER): Payer: Medicare Other | Admitting: Physician Assistant

## 2020-03-01 VITALS — BP 130/82 | HR 87 | Temp 97.9°F | Resp 12 | Ht 68.0 in | Wt 241.2 lb

## 2020-03-01 DIAGNOSIS — R45851 Suicidal ideations: Secondary | ICD-10-CM

## 2020-03-01 DIAGNOSIS — F339 Major depressive disorder, recurrent, unspecified: Secondary | ICD-10-CM

## 2020-03-01 DIAGNOSIS — E785 Hyperlipidemia, unspecified: Secondary | ICD-10-CM

## 2020-03-01 DIAGNOSIS — I10 Essential (primary) hypertension: Secondary | ICD-10-CM | POA: Diagnosis not present

## 2020-03-01 MED ORDER — SERTRALINE HCL 50 MG PO TABS: ORAL_TABLET | ORAL | 0 refills | Status: DC

## 2020-03-01 MED ORDER — TOPIRAMATE 50 MG PO TABS: ORAL_TABLET | ORAL | 0 refills | Status: DC

## 2020-03-01 MED ORDER — ATORVASTATIN CALCIUM 20 MG PO TABS: 20.0000 mg | ORAL_TABLET | Freq: Every day | ORAL | 0 refills | Status: DC

## 2020-03-01 MED ORDER — SUMATRIPTAN-NAPROXEN SODIUM 85-500 MG PO TABS: ORAL_TABLET | ORAL | 2 refills | Status: DC

## 2020-03-01 NOTE — Patient Instructions (Signed)
Suicidal Feelings: How to Help Yourself Suicide is when you end your own life. There are many things you can do to help yourself feel better when struggling with these feelings. Many services and people are available to support you and others who struggle with similar feelings.  If you ever feel like you may hurt yourself or others, or have thoughts about taking your own life, get help right away. To get help:  Call your local emergency services (911 in the [place]  [employer]'s health and human services helpline (211 in the [place]  Go to your nearest emergency department.  Call a suicide hotline to speak with a trained counselor. The following suicide hotlines are available in the [place]: ? 1-800-273-TALK (1-800-273-8255). ? 1-800-SUICIDE (1-800-784-2433). ? 1-888-628-9454. This is a hotline for Spanish speakers. ? 1-800-799-4889. This is a hotline for TTY users. ? 1-866-4-U-TREVOR (1-866-488-7386). This is a hotline for lesbian, gay, bisexual, transgender, or questioning youth. ? For a list of hotlines in [place], visit www.suicide.org/hotlines/international/[place]-suicide-hotlines.html  Contact a crisis center or a local suicide prevention center. To find a crisis center or suicide prevention center: ? Call your local hospital, clinic, community service organization, mental health center, social service provider, or health department. Ask for help with connecting to a crisis center. ? For a list of crisis centers in the [place], visit: suicidepreventionlifeline.org ? For a list of crisis centers in [place], visit: suicideprevention.ca How to help yourself feel better   Promise yourself that you will not do anything extreme when you have suicidal feelings. Remember, there is hope. Many people have gotten through suicidal thoughts and feelings, and you can too. If you have had these feelings before, remind yourself that you can get through them again.  Let family, friends,  teachers, or counselors know how you are feeling. Try not to separate yourself from those who care about you and want to help you. Talk with someone every day, even if you do not feel sociable. Face-to-face conversation is best to help them understand your feelings.  Contact a mental health care provider and work with this person regularly.  Make a safety plan that you can follow during a crisis. Include phone numbers of suicide prevention hotlines, mental health professionals, and trusted friends and family members you can call during an emergency. Save these numbers on your phone.  If you are thinking of taking a lot of medicine, give your medicine to someone who can give it to you as prescribed. If you are on antidepressants and are concerned you will overdose, tell your health care provider so that he or she can give you safer medicines.  Try to stick to your routines. Follow a schedule every day. Make self-care a priority.  Make a list of realistic goals, and cross them off when you achieve them. Accomplishments can give you a sense of worth.  Wait until you are feeling better before doing things that you find difficult or unpleasant.  Do things that you have always enjoyed to take your mind off your feelings. Try reading a book, or listening to or playing music. Spending time outside, in nature, may help you feel better. Follow these instructions at home:   Visit your primary health care provider every year for a checkup.  Work with a mental health care provider as needed.  Eat a well-balanced diet, and eat regular meals.  Get plenty of rest.  Exercise if you are able. Just 30 minutes of exercise each day can   help you feel better.  Take over-the-counter and prescription medicines only as told by your health care provider. Ask your mental health care provider about the possible side effects of any medicines you are taking.  Do not use alcohol or drugs, and remove these substances  from your home.  Remove weapons, poisons, knives, and other deadly items from your home. General recommendations  Keep your living space well lit.  When you are feeling well, write yourself a letter with tips and support that you can read when you are not feeling well.  Remember that life's difficulties can be sorted out with help. Conditions can be treated, and you can learn behaviors and ways of thinking that will help you. Where to find more information  National Suicide Prevention Lifeline: www.suicidepreventionlifeline.org  Hopeline: www.hopeline.com  American Foundation for Suicide Prevention: www.afsp.org  The Trevor Project (for lesbian, gay, bisexual, transgender, or questioning youth): www.thetrevorproject.org Contact a health care provider if:  You feel as though you are a burden to others.  You feel agitated, angry, vengeful, or have extreme mood swings.  You have withdrawn from family and friends. Get help right away if:  You are talking about suicide or wishing to die.  You start making plans for how to commit suicide.  You feel that you have no reason to live.  You start making plans for putting your affairs in order, saying goodbye, or giving your possessions away.  You feel guilt, shame, or unbearable pain, and it seems like there is no way out.  You are frequently using drugs or alcohol.  You are engaging in risky behaviors that could lead to death. If you have any of these symptoms, get help right away. Call emergency services, go to your nearest emergency department or crisis center, or call a suicide crisis helpline. Summary  Suicide is when you take your own life.  Promise yourself that you will not do anything extreme when you have suicidal feelings.  Let family, friends, teachers, or counselors know how you are feeling.  Get help right away if you feel as though life is getting too tough to handle and you are thinking about suicide. This  information is not intended to replace advice given to you by your health care provider. Make sure you discuss any questions you have with your health care provider. Document Revised: 01/02/2019 Document Reviewed: 04/24/2017 Elsevier Patient Education  2020 Elsevier Inc.  
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
## 2020-03-02 ENCOUNTER — Ambulatory Visit: Payer: Medicare Other | Admitting: Physician Assistant

## 2020-04-05 ENCOUNTER — Other Ambulatory Visit: Payer: Self-pay | Admitting: Physician Assistant

## 2020-05-18 ENCOUNTER — Other Ambulatory Visit: Payer: Self-pay | Admitting: Physician Assistant

## 2020-06-15 ENCOUNTER — Telehealth: Payer: Self-pay | Admitting: Cardiology

## 2020-06-15 MED ORDER — AMLODIPINE BESYLATE 5 MG PO TABS: 7.5000 mg | ORAL_TABLET | Freq: Every day | ORAL | 0 refills | Status: DC

## 2020-06-15 MED ORDER — LOSARTAN POTASSIUM 100 MG PO TABS: 100.0000 mg | ORAL_TABLET | Freq: Every day | ORAL | 0 refills | Status: DC

## 2020-06-15 MED ORDER — FUROSEMIDE 20 MG PO TABS: 20.0000 mg | ORAL_TABLET | Freq: Every day | ORAL | 0 refills | Status: DC

## 2020-06-15 NOTE — Telephone Encounter (Signed)
Pt's medication was sent to pt's pharmacy as requested. Confirmation received.  °

## 2020-06-15 NOTE — Telephone Encounter (Signed)
*  STAT* If patient is at the pharmacy, call can be transferred to refill team.   1. Which medications need to be refilled? (please list name of each medication and dose if known)  losartan (COZAAR) 100 MG tablet amLODipine (NORVASC) 5 MG tablet furosemide (LASIX) 20 MG tablet  2. Which pharmacy/location (including street and city if local pharmacy) is medication to be sent to? [facility] - [place], [place] - [address]  3. Do they need a 30 day or 90 day supply? 90 with refills    Pt is scheduled to see Ronie Spies on 08/31/20 but will not have enough medication to last her until her appt

## 2020-07-06 DIAGNOSIS — Z23 Encounter for immunization: Secondary | ICD-10-CM | POA: Diagnosis not present

## 2020-07-14 ENCOUNTER — Other Ambulatory Visit: Payer: Self-pay | Admitting: Physician Assistant
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
## 2020-08-27 ENCOUNTER — Other Ambulatory Visit: Payer: Self-pay

## 2020-08-27 ENCOUNTER — Encounter: Payer: Self-pay | Admitting: Physician Assistant

## 2020-08-27 ENCOUNTER — Telehealth: Payer: Self-pay | Admitting: *Deleted

## 2020-08-27 ENCOUNTER — Ambulatory Visit (INDEPENDENT_AMBULATORY_CARE_PROVIDER_SITE_OTHER): Payer: Medicare Other | Admitting: Physician Assistant

## 2020-08-27 VITALS — BP 142/80 | HR 80 | Ht 68.0 in | Wt 238.0 lb

## 2020-08-27 DIAGNOSIS — I10 Essential (primary) hypertension: Secondary | ICD-10-CM | POA: Diagnosis not present

## 2020-08-27 DIAGNOSIS — E785 Hyperlipidemia, unspecified: Secondary | ICD-10-CM

## 2020-08-27 DIAGNOSIS — I493 Ventricular premature depolarization: Secondary | ICD-10-CM | POA: Diagnosis not present

## 2020-08-27 DIAGNOSIS — I491 Atrial premature depolarization: Secondary | ICD-10-CM

## 2020-08-27 DIAGNOSIS — R600 Localized edema: Secondary | ICD-10-CM

## 2020-08-27 DIAGNOSIS — I50810 Right heart failure, unspecified: Secondary | ICD-10-CM | POA: Diagnosis not present

## 2020-08-27 MED ORDER — LOSARTAN POTASSIUM 100 MG PO TABS
100.0000 mg | ORAL_TABLET | Freq: Every day | ORAL | 3 refills | Status: DC
Start: 2020-08-27 — End: 2021-11-08

## 2020-08-27 MED ORDER — AMLODIPINE BESYLATE 5 MG PO TABS
7.5000 mg | ORAL_TABLET | Freq: Every day | ORAL | 3 refills | Status: DC
Start: 2020-08-27 — End: 2021-08-22

## 2020-08-27 MED ORDER — ATORVASTATIN CALCIUM 20 MG PO TABS
20.0000 mg | ORAL_TABLET | Freq: Every day | ORAL | 3 refills | Status: DC
Start: 2020-08-27 — End: 2021-09-20

## 2020-08-27 MED ORDER — FUROSEMIDE 20 MG PO TABS
20.0000 mg | ORAL_TABLET | Freq: Every day | ORAL | 3 refills | Status: DC
Start: 2020-08-27 — End: 2021-11-08

## 2020-08-27 NOTE — Telephone Encounter (Signed)
  Patient Consent for Virtual Visit         [name] has provided verbal consent on 08/27/2020 for a virtual visit (video or telephone).   CONSENT FOR VIRTUAL VISIT FOR:  [name]  By participating in this virtual visit I agree to the following:  I hereby voluntarily request, consent and authorize [facility] and its employed or contracted physicians, physician assistants, nurse practitioners or other licensed health care professionals (the Practitioner), to provide me with telemedicine health care services (the "Services") as deemed necessary by the treating Practitioner. I acknowledge and consent to receive the Services by the Practitioner via telemedicine. I understand that the telemedicine visit will involve communicating with the Practitioner through live audiovisual communication technology and the disclosure of certain medical information by electronic transmission. I acknowledge that I have been given the opportunity to request an in-person assessment or other available alternative prior to the telemedicine visit and am voluntarily participating in the telemedicine visit.  I understand that I have the right to withhold or withdraw my consent to the use of telemedicine in the course of my care at any time, without affecting my right to future care or treatment, and that the Practitioner or I may terminate the telemedicine visit at any time. I understand that I have the right to inspect all information obtained and/or recorded in the course of the telemedicine visit and may receive copies of available information for a reasonable fee.  I understand that some of the potential risks of receiving the Services via telemedicine include:  Marland Kitchen Delay or interruption in medical evaluation due to technological equipment failure or disruption; . Information transmitted may not be sufficient (e.g. poor resolution of images) to allow for appropriate medical decision making by the Practitioner;  and/or  . In rare instances, security protocols could fail, causing a breach of personal health information.  Furthermore, I acknowledge that it is my responsibility to provide information about my medical history, conditions and care that is complete and accurate to the best of my ability. I acknowledge that Practitioner's advice, recommendations, and/or decision may be based on factors not within their control, such as incomplete or inaccurate data provided by me or distortions of diagnostic images or specimens that may result from electronic transmissions. I understand that the practice of medicine is not an exact science and that Practitioner makes no warranties or guarantees regarding treatment outcomes. I acknowledge that a copy of this consent can be made available to me via my patient portal [facility] MyChart), or I can request a printed copy by calling the office of [facility].    I understand that my insurance will be billed for this visit.   I have read or had this consent read to me. . I understand the contents of this consent, which adequately explains the benefits and risks of the Services being provided via telemedicine.  . I have been provided ample opportunity to ask questions regarding this consent and the Services and have had my questions answered to my satisfaction. . I give my informed consent for the services to be provided through the use of telemedicine in my medical care

## 2020-08-27 NOTE — Patient Instructions (Addendum)
Medication Instructions:  Your physician has recommended you make the following change in your medication:  1.  CHANGE the Lasix to taking daily  *If you need a refill on your cardiac medications before your next appointment, please call your pharmacy*   Lab Work: TODAY:  BMET, CBC, & PRO BNP  If you have labs (blood work) drawn today and your tests are completely normal, you will receive your results only by: Marland Kitchen MyChart Message (if you have MyChart) OR . A paper copy in the mail If you have any lab test that is abnormal or we need to change your treatment, we will call you to review the results.   Testing/Procedures: Your physician has requested that you have an echocardiogram. Echocardiography is a painless test that uses sound waves to create images of your heart. It provides your doctor with information about the size and shape of your heart and how well your heart's chambers and valves are working. This procedure takes approximately one hour. There are no restrictions for this procedure.     Follow-Up: At [facility], you and your health needs are our priority.  As part of our continuing mission to provide you with exceptional heart care, we have created designated Provider Care Teams.  These Care Teams include your primary Cardiologist (physician) and Advanced Practice Providers (APPs -  Physician Assistants and Nurse Practitioners) who all work together to provide you with the care you need, when you need it.  We recommend signing up for the patient portal called "MyChart".  Sign up information is provided on this After Visit Summary.  MyChart is used to connect with patients for Virtual Visits (Telemedicine).  Patients are able to view lab/test results, encounter notes, upcoming appointments, etc.  Non-urgent messages can be sent to your provider as well.   To learn more about what you can do with MyChart, go to [email or web address].    Your next appointment:   6 week(s)     10/01/2020 2:15 SEE INSTRUCTIONS BELOW FOR VIRTUAL APPT  The format for your next appointment:   Virtual Visit    Provider:   Ronie Spies, PA-C   Other Instructions  Your [facility] team (Cardiologist [facility] Doctor] and Advanced Practice Provider [phone] Assistant; Nurse Practitioner]) has arranged for your next office appointment to be a virtual visit (also known as "Telehealth", "Telemedicine", "E-Visit").   We now offer virtual visits for all our patients.  This helps [place] to expand our ability to see patients in a timely and safe manner.  These visits are billed to your insurance just like traditional, in person, appointments.  Please review this IMPORTANT information about your upcoming appointment.   **PLEASE READ THE SECTION BELOW LABELED "CONSENT".**   **CALL OUR OFFICE WITH QUESTIONS.**   WHAT YOU NEED FOR YOUR VIRTUAL VISIT:  You will need a SmartPhone with microphone and video capability.  [If you are using MyChart to connect to your visit, it is also possible to use a desktop/laptop computer (with an Internet connection), as long as you have microphone and video capability.]  You will need to use Chrome, Edge or [employer] as your Chiropodist.  We highly recommend that you have a MyChart account as this will make connecting to your visit seamless.  A MyChart account not only allows you to connect to your provider for a virtual visit, but also allows you to see the results of all your tests, provider notes, medications and upcoming appointments.    A MyChart  account is not absolutely necessary.  We can still complete your visit if you do not have one.    If you do not have a computer or SmartPhone with video/microphone capability or your Internet/cell service is weak on the day of your visit, we will do your visit by telephone.    A blood pressure cuff and scale are essential to collect your vital signs at home.  If you do not have these and you are unable to obtain  them, please contact our office so that we can make arrangements for you.  If you have a pulse oximeter, Apple watch, Kardia mobile device, etc, you can collect data from these devices as well to share with the provider for your visit.  These devices are not required for a virtual visit.    WHAT TO DO ON THE DAY OF YOUR APPOINTMENT: 30 minutes before your appointment:  Take your blood pressure, pulse or heart rate (if your blood pressure machine is able to collect it) and weight.  Write all these numbers down so you can give it to the nurse or medical assistant that calls.  Get all of the medications you currently take and put them where you will be sitting for the appointment.  The nurse/medical assistant will go over these with you when he/she calls.  15 minutes before your appointment:  You will receive a phone call from a nurse or medical assistant from our office.  The caller ID on your phone may indicate that the caller is either "[facility]" or "[place]".  However, the number may come across as spam.  Please turn off any spam blocker so you do not miss the call.  The nurse will:  Ask for your blood pressure, pulse, weight, height.  Go over all of your medications to make sure your chart is correct.  Review your allergies, smoking history, reason for appointment, etc.   Give you instructions on how to connect with the video platform or telephone.  IF YOUR VISIT IS BY TELEPHONE ONLY: After the nurse finishes getting you ready for the visit, your provider will call you on the phone number you provide to [place].    After the appointment: Once your provider leaves the appointment, he/she will go over instructions with the nurse/medical assistant.  If needed, this person will call you with any instructions, appointments, etc.   A copy of your After Visit Summary (AVS) will be available later that day in your MyChart account.  This document will have all of your instructions,  medications, appointments, etc.  If you are not using MyChart, we will mail it to your home.     *CONSENT FOR TELE-HEALTH VISIT - PLEASE REVIEW* By participating in the scheduled virtual visit (and any virtual visit scheduled within 365 days of the printing of this document), I agree to the following:   I hereby voluntarily request, consent and authorize [facility] and its employed or contracted physicians, physician assistants, nurse practitioners or other licensed health care professionals (the Practitioner), to provide me with telemedicine health care services (the "Services") as deemed necessary by the treating Practitioner. I acknowledge and consent to receive the Services by the Practitioner via telemedicine. I understand that the telemedicine visit will involve communicating with the Practitioner through live audiovisual communication technology and the disclosure of certain medical information by electronic transmission. I acknowledge that I have been given the opportunity to request an in-person assessment or other available alternative prior to the telemedicine  visit and am voluntarily participating in the telemedicine visit.  I understand that I have the right to withhold or withdraw my consent to the use of telemedicine in the course of my care at any time, without affecting my right to future care or treatment, and that the Practitioner or I may terminate the telemedicine visit at any time. I understand that I have the right to inspect all information obtained and/or recorded in the course of the telemedicine visit and may receive copies of available information for a reasonable fee.  I understand that some of the potential risks of receiving the Services via telemedicine include:  Marland Kitchen Delay or interruption in medical evaluation due to technological equipment failure or disruption; . Information transmitted may not be sufficient (e.g. poor resolution of images) to allow for appropriate  medical decision making by the Practitioner; and/or  . In rare instances, security protocols could fail, causing a breach of personal health information.  Furthermore, I acknowledge that it is my responsibility to provide information about my medical history, conditions and care that is complete and accurate to the best of my ability. I acknowledge that Practitioner's advice, recommendations, and/or decision may be based on factors not within their control, such as incomplete or inaccurate data provided by me or distortions of diagnostic images or specimens that may result from electronic transmissions. I understand that the practice of medicine is not an exact science and that Practitioner makes no warranties or guarantees regarding treatment outcomes. I acknowledge that a copy of this consent can be made available to me via my patient portal [facility] MyChart), or I can request a printed copy by calling the office of [facility].    I understand that my insurance will be billed for this visit.   I have read or had this consent read to me. . I understand the contents of this consent, which adequately explains the benefits and risks of the Services being provided via telemedicine.  . I have been provided ample opportunity to ask questions regarding this consent and the Services and have had my questions answered to my satisfaction. . I give my informed consent for the services to be provided through the use of telemedicine in my medical care

## 2020-08-28 LAB — BASIC METABOLIC PANEL
BUN/Creatinine Ratio: 13 (ref 12–28)
BUN: 12 mg/dL (ref 8–27)
CO2: 21 mmol/L (ref 20–29)
Calcium: 9.8 mg/dL (ref 8.7–10.3)
Chloride: 104 mmol/L (ref 96–106)
Creatinine, Ser: 0.9 mg/dL (ref 0.57–1.00)
GFR calc Af Amer: 73 mL/min/{1.73_m2} (ref >59)
GFR calc non Af Amer: 64 mL/min/{1.73_m2} (ref >59)
Glucose: 94 mg/dL (ref 65–99)
Potassium: 4.2 mmol/L (ref 3.5–5.2)
Sodium: 141 mmol/L (ref 134–144)

## 2020-08-28 LAB — CBC
Hematocrit: 43.6 % (ref 34.0–46.6)
Hemoglobin: 14.3 g/dL (ref 11.1–15.9)
MCH: 28.9 pg (ref 26.6–33.0)
MCHC: 32.8 g/dL (ref 31.5–35.7)
MCV: 88 fL (ref 79–97)
Platelets: 324 10*3/uL (ref 150–450)
RBC: 4.94 x10E6/uL (ref 3.77–5.28)
RDW: 13.2 % (ref 11.7–15.4)
WBC: 8.4 10*3/uL (ref 3.4–10.8)

## 2020-08-28 LAB — PRO B NATRIURETIC PEPTIDE: NT-Pro BNP: 77 pg/mL (ref 0–301)

## 2020-08-30 NOTE — Addendum Note (Signed)
Addended by: Burnetta Sabin on: 08/30/2020 07:53 AM   Modules accepted: Orders

## 2020-08-31 LAB — HEPATIC FUNCTION PANEL
ALT: 15 IU/L (ref 0–32)
AST: 17 IU/L (ref 0–40)
Albumin: 4.2 g/dL (ref 3.7–4.7)
Alkaline Phosphatase: 109 IU/L (ref 44–121)
Bilirubin Total: <0.2 mg/dL (ref 0.0–1.2)
Bilirubin, Direct: <0.1 mg/dL (ref 0.00–0.40)
Total Protein: 7.1 g/dL (ref 6.0–8.5)

## 2020-08-31 LAB — SPECIMEN STATUS REPORT

## 2020-09-02 ENCOUNTER — Ambulatory Visit: Payer: Medicare Other | Admitting: Physician Assistant

## 2020-09-21 ENCOUNTER — Other Ambulatory Visit (HOSPITAL_COMMUNITY): Payer: Medicare Other

## 2020-10-01 ENCOUNTER — Telehealth: Payer: Medicare Other | Admitting: Physician Assistant

## 2020-10-06 ENCOUNTER — Other Ambulatory Visit: Payer: Self-pay

## 2020-10-06 ENCOUNTER — Ambulatory Visit (HOSPITAL_COMMUNITY): Payer: Medicare Other | Attending: Cardiology

## 2020-10-06 DIAGNOSIS — I50810 Right heart failure, unspecified: Secondary | ICD-10-CM | POA: Diagnosis not present

## 2020-10-06 DIAGNOSIS — I491 Atrial premature depolarization: Secondary | ICD-10-CM | POA: Diagnosis not present

## 2020-10-06 DIAGNOSIS — R600 Localized edema: Secondary | ICD-10-CM | POA: Insufficient documentation

## 2020-10-06 DIAGNOSIS — I493 Ventricular premature depolarization: Secondary | ICD-10-CM | POA: Insufficient documentation

## 2020-10-06 DIAGNOSIS — E785 Hyperlipidemia, unspecified: Secondary | ICD-10-CM

## 2020-10-06 DIAGNOSIS — I10 Essential (primary) hypertension: Secondary | ICD-10-CM | POA: Insufficient documentation

## 2020-10-06 LAB — ECHOCARDIOGRAM COMPLETE
Area-P 1/2: 2.69 cm2
S' Lateral: 2.4 cm

## 2020-10-07 ENCOUNTER — Telehealth: Payer: Self-pay

## 2020-10-07 NOTE — Telephone Encounter (Signed)
Called patient and LVM to let her know that her appointment on Jan 17th with Dr. Earlene Plater has been cancelled due to the provider being out of the office. Advised patient to call back [phone] to reschedule.

## 2020-10-08 ENCOUNTER — Encounter: Payer: Self-pay | Admitting: Internal Medicine

## 2020-10-08 ENCOUNTER — Telehealth (INDEPENDENT_AMBULATORY_CARE_PROVIDER_SITE_OTHER): Payer: Medicare Other | Admitting: Internal Medicine

## 2020-10-08 VITALS — BP 132/84 | Ht 68.0 in | Wt 242.0 lb

## 2020-10-08 DIAGNOSIS — K219 Gastro-esophageal reflux disease without esophagitis: Secondary | ICD-10-CM

## 2020-10-08 DIAGNOSIS — Z7689 Persons encountering health services in other specified circumstances: Secondary | ICD-10-CM

## 2020-10-08 MED ORDER — OMEPRAZOLE 20 MG PO CPDR: 20.0000 mg | DELAYED_RELEASE_CAPSULE | Freq: Every day | ORAL | 1 refills | Status: DC
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
## 2020-10-08 NOTE — Progress Notes (Signed)
Establish care   Requesting RF on omeprazole

## 2020-10-11 ENCOUNTER — Telehealth: Payer: Medicare Other | Admitting: Internal Medicine

## 2020-10-14 ENCOUNTER — Telehealth: Payer: Medicare Other | Admitting: Physician Assistant

## 2020-11-12 ENCOUNTER — Other Ambulatory Visit: Payer: Self-pay

## 2020-11-12 ENCOUNTER — Encounter: Payer: Self-pay | Admitting: Internal Medicine

## 2020-11-12 ENCOUNTER — Ambulatory Visit (INDEPENDENT_AMBULATORY_CARE_PROVIDER_SITE_OTHER): Payer: Medicare Other | Admitting: Internal Medicine

## 2020-11-12 VITALS — BP 123/84 | HR 64 | Temp 98.1°F | Resp 18 | Ht 68.0 in | Wt 239.0 lb

## 2020-11-12 DIAGNOSIS — F339 Major depressive disorder, recurrent, unspecified: Secondary | ICD-10-CM

## 2020-11-12 DIAGNOSIS — Z1159 Encounter for screening for other viral diseases: Secondary | ICD-10-CM

## 2020-11-12 DIAGNOSIS — I10 Essential (primary) hypertension: Secondary | ICD-10-CM | POA: Diagnosis not present

## 2020-11-12 DIAGNOSIS — Z0000 Encounter for general adult medical examination without abnormal findings: Secondary | ICD-10-CM | POA: Diagnosis not present

## 2020-11-12 DIAGNOSIS — E559 Vitamin D deficiency, unspecified: Secondary | ICD-10-CM

## 2020-11-12 DIAGNOSIS — E785 Hyperlipidemia, unspecified: Secondary | ICD-10-CM | POA: Diagnosis not present
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
## 2020-11-13 LAB — COMPREHENSIVE METABOLIC PANEL
ALT: 26 IU/L (ref 0–32)
AST: 23 IU/L (ref 0–40)
Albumin/Globulin Ratio: 1.6 (ref 1.2–2.2)
Albumin: 4.3 g/dL (ref 3.7–4.7)
Alkaline Phosphatase: 107 IU/L (ref 44–121)
BUN/Creatinine Ratio: 15 (ref 12–28)
BUN: 14 mg/dL (ref 8–27)
Bilirubin Total: 0.4 mg/dL (ref 0.0–1.2)
CO2: 20 mmol/L (ref 20–29)
Calcium: 9.2 mg/dL (ref 8.7–10.3)
Chloride: 107 mmol/L — ABNORMAL HIGH (ref 96–106)
Creatinine, Ser: 0.92 mg/dL (ref 0.57–1.00)
GFR calc Af Amer: 71 mL/min/{1.73_m2} (ref >59)
GFR calc non Af Amer: 62 mL/min/{1.73_m2} (ref >59)
Globulin, Total: 2.7 g/dL (ref 1.5–4.5)
Glucose: 93 mg/dL (ref 65–99)
Potassium: 4.5 mmol/L (ref 3.5–5.2)
Sodium: 141 mmol/L (ref 134–144)
Total Protein: 7 g/dL (ref 6.0–8.5)

## 2020-11-13 LAB — LIPID PANEL
Chol/HDL Ratio: 2.8 ratio (ref 0.0–4.4)
Cholesterol, Total: 158 mg/dL (ref 100–199)
HDL: 56 mg/dL (ref >39)
LDL Chol Calc (NIH): 75 mg/dL (ref 0–99)
Triglycerides: 158 mg/dL — ABNORMAL HIGH (ref 0–149)
VLDL Cholesterol Cal: 27 mg/dL (ref 5–40)

## 2020-11-13 LAB — HEPATITIS C ANTIBODY: Hep C Virus Ab: <0.1 s/co ratio (ref 0.0–0.9)

## 2020-11-13 LAB — VITAMIN D 25 HYDROXY (VIT D DEFICIENCY, FRACTURES): Vit D, 25-Hydroxy: 34.7 ng/mL (ref 30.0–100.0)

## 2020-11-19 ENCOUNTER — Encounter: Payer: Medicare Other | Admitting: Internal Medicine

## 2020-12-01 ENCOUNTER — Telehealth: Payer: Self-pay

## 2020-12-01 ENCOUNTER — Other Ambulatory Visit: Payer: Self-pay | Admitting: Internal Medicine

## 2020-12-01 MED ORDER — TERBINAFINE HCL 250 MG PO TABS: 250.0000 mg | ORAL_TABLET | Freq: Every day | ORAL | 0 refills | Status: DC

## 2020-12-01 NOTE — Telephone Encounter (Signed)
Sent in Terbinafine. She will need to take once daily for 12 weeks.   Marcy Siren, D.O. Primary Care at [facility]  12/01/2020, 4:21 PM

## 2020-12-01 NOTE — Telephone Encounter (Signed)
Contacted pt to discuss results and pt asked for follow-up on request for medication for toenail fungus as previously discussed w/ provider. Requests script be sent to [facility].
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
## 2020-12-22 ENCOUNTER — Ambulatory Visit (INDEPENDENT_AMBULATORY_CARE_PROVIDER_SITE_OTHER): Payer: Medicare Other | Admitting: Neurology

## 2020-12-22 ENCOUNTER — Encounter: Payer: Self-pay | Admitting: Neurology

## 2020-12-22 VITALS — BP 136/75 | HR 72 | Ht 68.0 in | Wt 245.0 lb

## 2020-12-22 DIAGNOSIS — G629 Polyneuropathy, unspecified: Secondary | ICD-10-CM | POA: Diagnosis not present

## 2020-12-22 DIAGNOSIS — G43909 Migraine, unspecified, not intractable, without status migrainosus: Secondary | ICD-10-CM | POA: Diagnosis not present

## 2020-12-22 MED ORDER — TOPIRAMATE 50 MG PO TABS: ORAL_TABLET | ORAL | 3 refills | Status: DC

## 2020-12-22 MED ORDER — DULOXETINE HCL 30 MG PO CPEP
30.0000 mg | ORAL_CAPSULE | Freq: Every day | ORAL | 5 refills | Status: DC
Start: 2020-12-22 — End: 2021-06-16

## 2020-12-22 MED ORDER — GABAPENTIN 300 MG PO CAPS: 300.0000 mg | ORAL_CAPSULE | Freq: Three times a day (TID) | ORAL | 3 refills | Status: DC

## 2020-12-22 NOTE — Patient Instructions (Addendum)
Referral to PT for gait and balance training  Continue gabapentin  Add on Cymbalta 30 mg daily for the neuropathy pain  Follow-up with PCP  See you back in 6 months   Duloxetine Delayed-Release Capsules What is this medicine? DULOXETINE (doo LOX e teen) is used to treat depression, anxiety, and different types of chronic pain. This medicine may be used for other purposes; ask your health care provider or pharmacist if you have questions. COMMON BRAND NAME(S): Cymbalta, Murrell Converse, Irenka What should I tell my health care provider before I take this medicine? They need to know if you have any of these conditions:  bipolar disorder  glaucoma  high blood pressure  kidney disease  liver disease  seizures  suicidal thoughts, plans or attempt; a previous suicide attempt by you or a family member  take medicines that treat or prevent blood clots  taken medicines called MAOIs like Carbex, Eldepryl, Marplan, Nardil, and Parnate within 14 days  trouble passing urine  an unusual reaction to duloxetine, other medicines, foods, dyes, or preservatives  pregnant or trying to get pregnant  breast-feeding How should I use this medicine? Take this medicine by mouth with a glass of water. Follow the directions on the prescription label. Do not crush, cut or chew some capsules of this medicine. Some capsules may be opened and sprinkled on applesauce. Check with your doctor or pharmacist if you are not sure. You can take this medicine with or without food. Take your medicine at regular intervals. Do not take your medicine more often than directed. Do not stop taking this medicine suddenly except upon the advice of your doctor. Stopping this medicine too quickly may cause serious side effects or your condition may worsen. A special MedGuide will be given to you by the pharmacist with each prescription and refill. Be sure to read this information carefully each time. Talk to your pediatrician  regarding the use of this medicine in children. While this drug may be prescribed for children as young as 62 years of age for selected conditions, precautions do apply. Overdosage: If you think you have taken too much of this medicine contact a poison control center or emergency room at once. NOTE: This medicine is only for you. Do not share this medicine with others. What if I miss a dose? If you miss a dose, take it as soon as you can. If it is almost time for your next dose, take only that dose. Do not take double or extra doses. What may interact with this medicine? Do not take this medicine with any of the following medications:  desvenlafaxine  levomilnacipran  linezolid  MAOIs like Carbex, Eldepryl, Emsam, Marplan, Nardil, and Parnate  methylene blue (injected into a vein)  milnacipran  safinamide  thioridazine  venlafaxine  viloxazine This medicine may also interact with the following medications:  alcohol  amphetamines  aspirin and aspirin-like medicines  certain antibiotics like ciprofloxacin and enoxacin  certain medicines for blood pressure, heart disease, irregular heart beat  certain medicines for depression, anxiety, or psychotic disturbances  certain medicines for migraine headache like almotriptan, eletriptan, frovatriptan, naratriptan, rizatriptan, sumatriptan, zolmitriptan  certain medicines that treat or prevent blood clots like warfarin, enoxaparin, and dalteparin  cimetidine  fentanyl  lithium  NSAIDS, medicines for pain and inflammation, like ibuprofen or naproxen  phentermine  procarbazine  rasagiline  sibutramine  St. John's wort  theophylline  tramadol  tryptophan This list may not describe all possible interactions. Give your health care  provider a list of all the medicines, herbs, non-prescription drugs, or dietary supplements you use. Also tell them if you smoke, drink alcohol, or use illegal drugs. Some items may  interact with your medicine. What should I watch for while using this medicine? Tell your doctor if your symptoms do not get better or if they get worse. Visit your doctor or healthcare provider for regular checks on your progress. Because it may take several weeks to see the full effects of this medicine, it is important to continue your treatment as prescribed by your doctor. This medicine may cause serious skin reactions. They can happen weeks to months after starting the medicine. Contact your healthcare provider right away if you notice fevers or flu-like symptoms with a rash. The rash may be red or purple and then turn into blisters or peeling of the skin. Or, you might notice a red rash with swelling of the face, lips, or lymph nodes in your neck or under your arms. Patients and their families should watch out for new or worsening thoughts of suicide or depression. Also watch out for sudden changes in feelings such as feeling anxious, agitated, panicky, irritable, hostile, aggressive, impulsive, severely restless, overly excited and hyperactive, or not being able to sleep. If this happens, especially at the beginning of treatment or after a change in dose, call your healthcare provider. You may get drowsy or dizzy. Do not drive, use machinery, or do anything that needs mental alertness until you know how this medicine affects you. Do not stand or sit up quickly, especially if you are an older patient. This reduces the risk of dizzy or fainting spells. Alcohol may interfere with the effect of this medicine. Avoid alcoholic drinks. This medicine can cause an increase in blood pressure. This medicine can also cause a sudden drop in your blood pressure, which may make you feel faint and increase the chance of a fall. These effects are most common when you first start the medicine or when the dose is increased, or during use of other medicines that can cause a sudden drop in blood pressure. Check with your  doctor for instructions on monitoring your blood pressure while taking this medicine. Your mouth may get dry. Chewing sugarless gum or sucking hard candy, and drinking plenty of water, may help. Contact your doctor if the problem does not go away or is severe. What side effects may I notice from receiving this medicine? Side effects that you should report to your doctor or health care professional as soon as possible:  allergic reactions like skin rash, itching or hives, swelling of the face, lips, or tongue  anxious  breathing problems  confusion  changes in vision  chest pain  confusion  elevated mood, decreased need for sleep, racing thoughts, impulsive behavior  eye pain  fast, irregular heartbeat  feeling faint or lightheaded, falls  feeling agitated, angry, or irritable  hallucination, loss of contact with reality  high blood pressure  loss of balance or coordination  palpitations  redness, blistering, peeling or loosening of the skin, including inside the mouth  restlessness, pacing, inability to keep still  seizures  stiff muscles  suicidal thoughts or other mood changes  trouble passing urine or change in the amount of urine  trouble sleeping  unusual bleeding or bruising  unusually weak or tired  vomiting  yellowing of the eyes or skin Side effects that usually do not require medical attention (report to your doctor or health care professional  if they continue or are bothersome):  change in sex drive or performance  change in appetite or weight  constipation  dizziness  dry mouth  headache  increased sweating  nausea  tired This list may not describe all possible side effects. Call your doctor for medical advice about side effects. You may report side effects to FDA at 1-800-FDA-1088. Where should I keep my medicine? Keep out of the reach of children and pets. Store at room temperature between 15 and 30 degrees C (59 to 86  degrees F). Get rid of any unused medicine after the expiration date. To get rid of medicines that are no longer needed or have expired:  Take the medicine to a medicine take-back program. Check with your pharmacy or law enforcement to find a location.  If you cannot return the medicine, check the label or package insert to see if the medicine should be thrown out in the garbage or flushed down the toilet. If you are not sure, ask your health care provider. If it is safe to put it in the trash, take the medicine out of the container. Mix the medicine with cat litter, dirt, coffee grounds, or other unwanted substance. Seal the mixture in a bag or container. Put it in the trash. NOTE: This sheet is a summary. It may not cover all possible information. If you have questions about this medicine, talk to your doctor, pharmacist, or health care provider.  2021 Elsevier/Gold Standard (2020-07-29 16:06:16)

## 2021-01-17 ENCOUNTER — Encounter: Payer: Self-pay | Admitting: Physical Therapy

## 2021-01-17 ENCOUNTER — Ambulatory Visit: Payer: Medicare Other | Attending: Neurology | Admitting: Physical Therapy

## 2021-01-17 ENCOUNTER — Other Ambulatory Visit: Payer: Self-pay

## 2021-01-17 DIAGNOSIS — M6281 Muscle weakness (generalized): Secondary | ICD-10-CM | POA: Insufficient documentation

## 2021-01-17 DIAGNOSIS — R2689 Other abnormalities of gait and mobility: Secondary | ICD-10-CM | POA: Diagnosis not present

## 2021-01-17 DIAGNOSIS — Z9181 History of falling: Secondary | ICD-10-CM | POA: Diagnosis not present

## 2021-01-17 DIAGNOSIS — R296 Repeated falls: Secondary | ICD-10-CM | POA: Diagnosis not present

## 2021-01-17 NOTE — Patient Instructions (Signed)
Access Code: [email or web address] URL: [email or web address] Date: 01/17/2021 Prepared by: Jeri Cos  Exercises  Supine March - 1 x daily - 7 x weekly - 2-3 sets - 10 reps Seated Hamstring Stretch - 1 x daily - 7 x weekly - 3 sets - 10 reps - 30 seconds hold Standing Tandem Balance with Counter Support - 1 x daily - 7 x weekly - 3 sets - 30 seconds hold Standing Romberg to 3/4 Tandem Stance - 1 x daily - 7 x weekly - 3 sets - 30 seconds hold
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
## 2021-01-28 ENCOUNTER — Other Ambulatory Visit: Payer: Self-pay

## 2021-01-28 ENCOUNTER — Ambulatory Visit: Payer: Medicare Other | Attending: Neurology | Admitting: Physical Therapy

## 2021-01-28 ENCOUNTER — Encounter: Payer: Self-pay | Admitting: Physical Therapy

## 2021-01-28 DIAGNOSIS — Z9181 History of falling: Secondary | ICD-10-CM | POA: Insufficient documentation

## 2021-01-28 DIAGNOSIS — R2689 Other abnormalities of gait and mobility: Secondary | ICD-10-CM | POA: Insufficient documentation

## 2021-01-28 DIAGNOSIS — R296 Repeated falls: Secondary | ICD-10-CM | POA: Diagnosis not present

## 2021-01-28 DIAGNOSIS — M6281 Muscle weakness (generalized): Secondary | ICD-10-CM | POA: Insufficient documentation
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
## 2021-02-04 ENCOUNTER — Encounter: Payer: Self-pay | Admitting: Physical Therapy

## 2021-02-04 ENCOUNTER — Ambulatory Visit: Payer: Medicare Other | Admitting: Physical Therapy

## 2021-02-04 ENCOUNTER — Other Ambulatory Visit: Payer: Self-pay

## 2021-02-04 DIAGNOSIS — M6281 Muscle weakness (generalized): Secondary | ICD-10-CM | POA: Diagnosis not present

## 2021-02-04 DIAGNOSIS — R296 Repeated falls: Secondary | ICD-10-CM | POA: Diagnosis not present

## 2021-02-04 DIAGNOSIS — R2689 Other abnormalities of gait and mobility: Secondary | ICD-10-CM

## 2021-02-04 DIAGNOSIS — Z9181 History of falling: Secondary | ICD-10-CM | POA: Diagnosis not present
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
## 2021-02-09 ENCOUNTER — Encounter: Payer: Self-pay | Admitting: Physical Therapy

## 2021-02-09 ENCOUNTER — Ambulatory Visit: Payer: Medicare Other | Admitting: Physical Therapy

## 2021-02-09 ENCOUNTER — Other Ambulatory Visit: Payer: Self-pay

## 2021-02-09 DIAGNOSIS — R296 Repeated falls: Secondary | ICD-10-CM | POA: Diagnosis not present

## 2021-02-09 DIAGNOSIS — Z9181 History of falling: Secondary | ICD-10-CM | POA: Diagnosis not present

## 2021-02-09 DIAGNOSIS — M6281 Muscle weakness (generalized): Secondary | ICD-10-CM | POA: Diagnosis not present

## 2021-02-09 DIAGNOSIS — R2689 Other abnormalities of gait and mobility: Secondary | ICD-10-CM | POA: Diagnosis not present

## 2021-02-09 NOTE — Patient Instructions (Signed)
Access Code: [email or web address] URL: [email or web address] Date: 02/09/2021 Prepared by: Rosana Hoes  Exercises Seated Hamstring Stretch - 1 x daily - 7 x weekly - 2 reps - 30 seconds hold Standing Gastroc Stretch at Counter - 1 x daily - 7 x weekly - 2 reps - 30 seconds hold Heel Toe Raises with Counter Support - 1 x daily - 5 x weekly - 2 sets - 15 reps Standing Marching - 1 x daily - 5 x weekly - 2 sets - 10 reps Standing Hip Abduction with Counter Support - 1 x daily - 5 x weekly - 2 sets - 10 reps Standing Hip Extension with Counter Support - 1 x daily - 5 x weekly - 2 sets - 10 reps Sit to Stand with Arms Crossed - 1 x daily - 5 x weekly - 2 sets - 10 reps Standing Tandem Balance with Counter Support - 1 x daily - 7 x weekly - 3 sets - 30 seconds hold Romberg Stance with Head Rotation - 1 x daily - 7 x weekly - 10 reps Romberg Stance with Head Nods - 1 x daily - 7 x weekly - 10 reps

## 2021-02-10 ENCOUNTER — Encounter: Payer: Self-pay | Admitting: Physical Therapy
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
## 2021-02-16 ENCOUNTER — Ambulatory Visit: Payer: Medicare Other | Admitting: Physical Therapy

## 2021-02-16 ENCOUNTER — Other Ambulatory Visit: Payer: Self-pay

## 2021-02-16 ENCOUNTER — Encounter: Payer: Self-pay | Admitting: Physical Therapy

## 2021-02-16 DIAGNOSIS — M6281 Muscle weakness (generalized): Secondary | ICD-10-CM

## 2021-02-16 DIAGNOSIS — R296 Repeated falls: Secondary | ICD-10-CM

## 2021-02-16 DIAGNOSIS — R2689 Other abnormalities of gait and mobility: Secondary | ICD-10-CM

## 2021-02-16 DIAGNOSIS — Z9181 History of falling: Secondary | ICD-10-CM
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
## 2021-02-25 ENCOUNTER — Ambulatory Visit: Payer: Medicare Other | Admitting: Physical Therapy

## 2021-03-02 ENCOUNTER — Encounter: Payer: Self-pay | Admitting: Physical Therapy

## 2021-03-02 ENCOUNTER — Ambulatory Visit: Payer: Medicare Other | Attending: Neurology | Admitting: Physical Therapy

## 2021-03-02 ENCOUNTER — Other Ambulatory Visit: Payer: Self-pay

## 2021-03-02 DIAGNOSIS — R296 Repeated falls: Secondary | ICD-10-CM | POA: Insufficient documentation

## 2021-03-02 DIAGNOSIS — M6281 Muscle weakness (generalized): Secondary | ICD-10-CM | POA: Diagnosis not present

## 2021-03-02 DIAGNOSIS — Z9181 History of falling: Secondary | ICD-10-CM

## 2021-03-02 DIAGNOSIS — R2689 Other abnormalities of gait and mobility: Secondary | ICD-10-CM
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
## 2021-03-09 ENCOUNTER — Encounter: Payer: Self-pay | Admitting: Physical Therapy

## 2021-03-09 ENCOUNTER — Ambulatory Visit: Payer: Medicare Other | Admitting: Physical Therapy

## 2021-03-09 ENCOUNTER — Other Ambulatory Visit: Payer: Self-pay

## 2021-03-09 DIAGNOSIS — M6281 Muscle weakness (generalized): Secondary | ICD-10-CM | POA: Diagnosis not present

## 2021-03-09 DIAGNOSIS — R2689 Other abnormalities of gait and mobility: Secondary | ICD-10-CM

## 2021-03-09 DIAGNOSIS — Z9181 History of falling: Secondary | ICD-10-CM | POA: Diagnosis not present

## 2021-03-09 DIAGNOSIS — R296 Repeated falls: Secondary | ICD-10-CM | POA: Diagnosis not present
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
## 2021-03-16 ENCOUNTER — Ambulatory Visit: Payer: Medicare Other | Admitting: Physical Therapy

## 2021-06-16 ENCOUNTER — Other Ambulatory Visit: Payer: Self-pay | Admitting: Neurology

## 2021-07-04 ENCOUNTER — Ambulatory Visit: Payer: Medicare Other | Admitting: Neurology

## 2021-07-05 ENCOUNTER — Encounter: Payer: Self-pay | Admitting: Neurology

## 2021-07-05 ENCOUNTER — Ambulatory Visit (INDEPENDENT_AMBULATORY_CARE_PROVIDER_SITE_OTHER): Payer: Medicare Other | Admitting: Neurology

## 2021-07-05 DIAGNOSIS — G629 Polyneuropathy, unspecified: Secondary | ICD-10-CM

## 2021-07-05 DIAGNOSIS — G43909 Migraine, unspecified, not intractable, without status migrainosus: Secondary | ICD-10-CM | POA: Diagnosis not present

## 2021-07-05 MED ORDER — SUMATRIPTAN-NAPROXEN SODIUM 85-500 MG PO TABS: ORAL_TABLET | ORAL | 11 refills | Status: DC

## 2021-07-05 MED ORDER — TIZANIDINE HCL 4 MG PO TABS: 4.0000 mg | ORAL_TABLET | Freq: Four times a day (QID) | ORAL | 6 refills | Status: DC | PRN

## 2021-07-05 MED ORDER — ONDANSETRON 4 MG PO TBDP: 4.0000 mg | ORAL_TABLET | Freq: Three times a day (TID) | ORAL | 6 refills | Status: DC | PRN
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
## 2021-07-05 NOTE — Patient Instructions (Addendum)
Meds ordered this encounter  Medications   ondansetron (ZOFRAN ODT) 4 MG disintegrating tablet    Sig: Take 1 tablet (4 mg total) by mouth every 8 (eight) hours as needed.    Dispense:  20 tablet    Refill:  6   tiZANidine (ZANAFLEX) 4 MG tablet    Sig: Take 1 tablet (4 mg total) by mouth every 6 (six) hours as needed for muscle spasms.    Dispense:  30 tablet    Refill:  6     For severe migraine:  You can take Treximet (Sumatriptan +naprxoen), with zofran for nausea, tizanidine for muscle relaxant, even Benadryl 25mg  as needed.

## 2021-07-06 ENCOUNTER — Other Ambulatory Visit: Payer: Self-pay

## 2021-08-22 ENCOUNTER — Encounter: Payer: Self-pay | Admitting: Family Medicine

## 2021-08-22 ENCOUNTER — Other Ambulatory Visit: Payer: Self-pay

## 2021-08-22 ENCOUNTER — Ambulatory Visit (INDEPENDENT_AMBULATORY_CARE_PROVIDER_SITE_OTHER): Payer: Medicare Other | Admitting: Family Medicine

## 2021-08-22 VITALS — BP 152/89 | HR 66 | Temp 98.4°F | Resp 16 | Wt 235.0 lb

## 2021-08-22 DIAGNOSIS — I10 Essential (primary) hypertension: Secondary | ICD-10-CM

## 2021-08-22 DIAGNOSIS — K219 Gastro-esophageal reflux disease without esophagitis: Secondary | ICD-10-CM

## 2021-08-22 DIAGNOSIS — F339 Major depressive disorder, recurrent, unspecified: Secondary | ICD-10-CM | POA: Diagnosis not present

## 2021-08-22 DIAGNOSIS — M25562 Pain in left knee: Secondary | ICD-10-CM | POA: Diagnosis not present

## 2021-08-22 MED ORDER — TRIAMCINOLONE ACETONIDE 40 MG/ML IJ SUSP
40.0000 mg | Freq: Once | INTRAMUSCULAR | Status: AC
  Administered 2021-08-22: 16:00:00 40 mg via INTRAMUSCULAR

## 2021-08-22 MED ORDER — AMLODIPINE BESYLATE 10 MG PO TABS: 10.0000 mg | ORAL_TABLET | Freq: Every day | ORAL | 0 refills | Status: DC

## 2021-08-22 MED ORDER — OMEPRAZOLE 20 MG PO CPDR
20.0000 mg | DELAYED_RELEASE_CAPSULE | Freq: Every day | ORAL | 1 refills | Status: DC
Start: 2021-08-22 — End: 2022-02-22

## 2021-08-22 MED ORDER — ONDANSETRON 4 MG PO TBDP: 4.0000 mg | ORAL_TABLET | Freq: Three times a day (TID) | ORAL | 6 refills | Status: DC | PRN

## 2021-08-22 NOTE — Progress Notes (Signed)
Patient said she slip in the bathroom 08/17/2021. Patient would like to get her knee check out. Patient said her knee hurt on the side.
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
## 2021-09-20 ENCOUNTER — Other Ambulatory Visit: Payer: Self-pay | Admitting: Physician Assistant

## 2021-10-03 ENCOUNTER — Other Ambulatory Visit: Payer: Self-pay

## 2021-10-03 ENCOUNTER — Encounter: Payer: Self-pay | Admitting: Family Medicine

## 2021-10-03 ENCOUNTER — Ambulatory Visit (INDEPENDENT_AMBULATORY_CARE_PROVIDER_SITE_OTHER): Payer: Medicare Other | Admitting: Family Medicine

## 2021-10-03 VITALS — BP 138/87 | HR 61 | Temp 98.1°F | Resp 16 | Wt 229.8 lb

## 2021-10-03 DIAGNOSIS — F339 Major depressive disorder, recurrent, unspecified: Secondary | ICD-10-CM

## 2021-10-03 DIAGNOSIS — M25562 Pain in left knee: Secondary | ICD-10-CM

## 2021-10-03 DIAGNOSIS — K219 Gastro-esophageal reflux disease without esophagitis: Secondary | ICD-10-CM | POA: Diagnosis not present

## 2021-10-03 DIAGNOSIS — I10 Essential (primary) hypertension: Secondary | ICD-10-CM
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
## 2021-10-03 NOTE — Progress Notes (Signed)
Patient is going to [place] and had question concerning her blood pressure issues while doing excursion

## 2021-10-21 ENCOUNTER — Other Ambulatory Visit: Payer: Self-pay | Admitting: Cardiology

## 2021-10-24 ENCOUNTER — Telehealth: Payer: Self-pay | Admitting: Cardiology

## 2021-10-24 MED ORDER — ATORVASTATIN CALCIUM 20 MG PO TABS: ORAL_TABLET | ORAL | 0 refills | Status: DC

## 2021-10-24 NOTE — Telephone Encounter (Signed)
°*  STAT* If patient is at the pharmacy, call can be transferred to refill team.   1. Which medications need to be refilled? (please list name of each medication and dose if known)atorvastatin (LIPITOR) 20 MG tablet  2. Which pharmacy/location (including street and city if local pharmacy) is medication to be sent to? [facility] - [place], [place] - [address]  3. Do they need a 30 day or 90 day supply?  Needs enough medication to last her until 03/07 appt that is scheduled. Currently has a 30 day supply.

## 2021-10-24 NOTE — Telephone Encounter (Signed)
Refill has been sent in.  

## 2021-11-08 ENCOUNTER — Other Ambulatory Visit: Payer: Self-pay | Admitting: Physician Assistant

## 2021-11-22 ENCOUNTER — Other Ambulatory Visit: Payer: Self-pay | Admitting: Family Medicine

## 2021-11-29 ENCOUNTER — Encounter: Payer: Self-pay | Admitting: Cardiology

## 2021-11-29 ENCOUNTER — Other Ambulatory Visit: Payer: Self-pay

## 2021-11-29 ENCOUNTER — Ambulatory Visit (INDEPENDENT_AMBULATORY_CARE_PROVIDER_SITE_OTHER): Payer: Medicare Other | Admitting: Cardiology

## 2021-11-29 VITALS — BP 118/70 | HR 76 | Ht 68.0 in | Wt 234.6 lb

## 2021-11-29 DIAGNOSIS — R001 Bradycardia, unspecified: Secondary | ICD-10-CM

## 2021-11-29 DIAGNOSIS — I10 Essential (primary) hypertension: Secondary | ICD-10-CM | POA: Diagnosis not present

## 2021-11-29 DIAGNOSIS — R600 Localized edema: Secondary | ICD-10-CM

## 2021-11-29 DIAGNOSIS — I493 Ventricular premature depolarization: Secondary | ICD-10-CM | POA: Diagnosis not present

## 2021-11-29 MED ORDER — FUROSEMIDE 20 MG PO TABS: 20.0000 mg | ORAL_TABLET | Freq: Every day | ORAL | 3 refills | Status: DC

## 2021-11-29 MED ORDER — LOSARTAN POTASSIUM 100 MG PO TABS: 100.0000 mg | ORAL_TABLET | Freq: Every day | ORAL | 3 refills | Status: DC

## 2021-11-29 MED ORDER — ATORVASTATIN CALCIUM 20 MG PO TABS: ORAL_TABLET | ORAL | 3 refills | Status: DC

## 2021-11-29 MED ORDER — AMLODIPINE BESYLATE 10 MG PO TABS: 10.0000 mg | ORAL_TABLET | Freq: Every day | ORAL | 3 refills | Status: DC

## 2021-11-29 NOTE — Patient Instructions (Addendum)
Dr. Mayford Knife recommends that you wear compression hose during the day.  ? ?Medication Instructions:  ?Your physician recommends that you continue on your current medications as directed. Please refer to the Current Medication list given to you today. ? ?*If you need a refill on your cardiac medications before your next appointment, please call your pharmacy* ? ?Lab Work: ?TODAY: BMET ?If you have labs (blood work) drawn today and your tests are completely normal, you will receive your results only by: ?MyChart Message (if you have MyChart) OR ?A paper copy in the mail ?If you have any lab test that is abnormal or we need to change your treatment, we will call you to review the results. ? ?Testing/Procedures: ?Your physician has recommended that you wear an event monitor. Event monitors are medical devices that record the heart?s electrical activity. Doctors most often [place] these monitors to diagnose arrhythmias. Arrhythmias are problems with the speed or rhythm of the heartbeat. The monitor is a small, portable device. You can wear one while you do your normal daily activities. This is usually used to diagnose what is causing palpitations/syncope (passing out). ? ? ?Follow-Up: ?At [facility], you and your health needs are our priority.  As part of our continuing mission to provide you with exceptional heart care, we have created designated Provider Care Teams.  These Care Teams include your primary Cardiologist (physician) and Advanced Practice Providers (APPs -  Physician Assistants and Nurse Practitioners) who all work together to provide you with the care you need, when you need it. ? ? ?Your next appointment:   ?6 month(s) ? ?The format for your next appointment:   ?In Person ? ?Provider:   ?Armanda Magic, MD   ? ? ?Other Instructions ?[employer] Cardiac Event Monitor Instructions ?Your physician has requested you wear your cardiac event monitor for _30_ days, (1-30). ?[employer] may call or text to confirm a  shipping address. The monitor will be sent to a land address via UPS. ?[employer] will not ship a monitor to a PO BOX. It typically takes 3-5 days to receive your monitor after it has ?been enrolled. [employer] will assist with USPS tracking if your package is delayed. The telephone number for ?[employer] is [phone]. ?Once you have received your monitor, please review the enclosed instructions. Instruction tutorials can also be ?viewed under help and settings on the enclosed cell phone. Your monitor has already been registered assigning ?a specific monitor serial # to you. ? ?Applying the monitor ?Remove cell phone from case and turn it on. The cell phone works as IT consultant and needs to be within ?10 feet of you at all times. The cell phone will need to be charged on a daily basis. We recommend you plug the ?cell phone into the enclosed charger at your bedside table every night. ? ?Monitor batteries: You will receive two monitor batteries labelled #1 and #2. These are your recorders. Plug ?battery #2 onto the second connection on the enclosed charger. Keep one battery on the charger at all times. ?This will keep the monitor battery deactivated. It will also keep it fully charged for when you need to switch ?your monitor batteries. A small light will be blinking on the battery emblem when it is charging. The light on the ?battery emblem will remain on when the battery is fully charged. ? ?Open package of a Monitor strip. Insert battery #1 into black hood on strip and gently squeeze monitor battery ?onto connection as indicated in instruction booklet. Set aside  while preparing skin. ? ?Choose location for your strip, vertical or horizontal, as indicated in the instruction booklet. Shave to remove all ?hair from location. There cannot be any lotions, oils, powders, or colognes on skin where monitor is to be ?applied. Wipe skin clean with enclosed Saline wipe. Dry skin completely. ? ?Peel paper labeled #1  off the back of the Monitor strip exposing the adhesive. Place the monitor on the chest in ?the vertical or horizontal position shown in the instruction booklet. One arrow on the monitor strip must be ?pointing upward. Carefully remove paper labeled #2, attaching remainder of strip to your skin. Try not to ?create any folds or wrinkles in the strip as you apply it. ? ?Firmly press and release the circle in the center of the monitor battery. You will hear a small beep. This is ?turning the monitor battery on. The heart emblem on the monitor battery will light up every 5 seconds if the ?monitor battery in turned on and connected to the patient securely. Do not push and hold the circle down as ?this turns the monitor battery off. The cell phone will locate the monitor battery. A screen will appear on the ?cell phone checking the connection of your monitor strip. This may read poor connection initially but change to ?good connection within the next minute. Once your monitor accepts the connection you will hear a series of 3 ?beeps followed by a climbing crescendo of beeps. A screen will appear on the cell phone showing the two ?monitor strip placement options. Touch the picture that demonstrates where you applied the monitor strip. ? ?Your monitor strip and battery are waterproof. You are able to shower, bathe, or swim with the monitor on. ?They just ask you do not submerge deeper than 3 feet underwater. We recommend removing the monitor if ?you are swimming in a lake, river, or ocean. ? ?Your monitor battery will need to be switched to a fully charged monitor battery approximately once a week. ?The cell phone will alert you of an action which needs to be made. ? ?On the cell phone, tap for details to reveal connection status, monitor battery status, and cell phone battery ?status. The green dots indicates your monitor is in good status. A red dot indicates there is something that ?needs your attention. ? ?To record a  symptom, click the circle on the monitor battery. In 30-60 seconds a list of symptoms will appear on ?the cell phone. Select your symptom and tap save. Your monitor will record a sustained or significant ?arrhythmia regardless of you clicking the button. Some patients do not feel the heart rhythm irregularities. ?[employer] will notify us of any serious or critical events. ? ?Refer to instruction booklet for instructions on switching batteries, changing strips, the Do not disturb or Pause ?features, or any additional questions. ? ?Call [employer] at [phone], to confirm your monitor is transmitting and record your baseline. They will ?answer any questions you may have regarding the monitor instructions at that time. ? ?Returning the monitor to [employer] ?Place all equipment back into blue box. Peel off strip of paper to expose adhesive and close box securely. There ?is a prepaid UPS shipping label on this box. Drop in a UPS drop box, or at a UPS facility like [employer]. You may ?also contact [employer] to arrange UPS to pick up monitor package at your home. ?  ?
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
## 2021-11-30 LAB — BASIC METABOLIC PANEL
BUN/Creatinine Ratio: 15 (ref 12–28)
BUN: 14 mg/dL (ref 8–27)
CO2: 21 mmol/L (ref 20–29)
Calcium: 9.4 mg/dL (ref 8.7–10.3)
Chloride: 105 mmol/L (ref 96–106)
Creatinine, Ser: 0.96 mg/dL (ref 0.57–1.00)
Glucose: 94 mg/dL (ref 70–99)
Potassium: 4.3 mmol/L (ref 3.5–5.2)
Sodium: 141 mmol/L (ref 134–144)
eGFR: 62 mL/min/{1.73_m2} (ref >59)

## 2021-12-07 ENCOUNTER — Ambulatory Visit (INDEPENDENT_AMBULATORY_CARE_PROVIDER_SITE_OTHER): Payer: Medicare Other

## 2021-12-07 DIAGNOSIS — I10 Essential (primary) hypertension: Secondary | ICD-10-CM | POA: Diagnosis not present

## 2021-12-07 DIAGNOSIS — I493 Ventricular premature depolarization: Secondary | ICD-10-CM

## 2021-12-07 DIAGNOSIS — R600 Localized edema: Secondary | ICD-10-CM | POA: Diagnosis not present

## 2021-12-07 DIAGNOSIS — R001 Bradycardia, unspecified: Secondary | ICD-10-CM | POA: Diagnosis not present

## 2021-12-20 ENCOUNTER — Other Ambulatory Visit: Payer: Self-pay | Admitting: Neurology

## 2021-12-20 ENCOUNTER — Other Ambulatory Visit: Payer: Self-pay | Admitting: Family Medicine

## 2021-12-21 ENCOUNTER — Other Ambulatory Visit: Payer: Self-pay | Admitting: Cardiology

## 2021-12-21 NOTE — Telephone Encounter (Signed)
Pt's pharmacy is requesting a refill on duloxetine (Cymbalta) 30 mg tablet. Would Dr. Mayford Knife like to refill this medication? Please address ?

## 2022-01-09 ENCOUNTER — Other Ambulatory Visit: Payer: Self-pay | Admitting: Neurology

## 2022-01-12 DIAGNOSIS — Z20822 Contact with and (suspected) exposure to covid-19: Secondary | ICD-10-CM | POA: Diagnosis not present

## 2022-01-17 ENCOUNTER — Other Ambulatory Visit: Payer: Self-pay | Admitting: Neurology

## 2022-01-19 ENCOUNTER — Telehealth: Payer: Self-pay | Admitting: Neurology

## 2022-01-19 MED ORDER — TOPIRAMATE 50 MG PO TABS
ORAL_TABLET | ORAL | 0 refills | Status: DC
Start: 2022-01-19 — End: 2022-04-17

## 2022-01-19 NOTE — Telephone Encounter (Signed)
I spoke with the patient. As per last office visit note, Dr. Terrace Arabia wanted pt to follow up with PCP and only follow up for new issues. I informed the patient to ask PCP if she would take over topiramate 50 mg tablet refills. She has not spoken to her PCP yet, but will follow up with her. I will send a 90 day supply of medication (as insurance prefers) in the meantime. Patient verbalized understanding and expressed appreciation for the call. All questions answered. ?

## 2022-01-19 NOTE — Telephone Encounter (Signed)
Pt called stating that the pharmacy has been reaching out to get her topiramate (TOPAMAX) 50 MG tablet filled. Please send refill to [facility]. ?

## 2022-01-23 DIAGNOSIS — Z20822 Contact with and (suspected) exposure to covid-19: Secondary | ICD-10-CM | POA: Diagnosis not present

## 2022-01-30 ENCOUNTER — Ambulatory Visit: Payer: Medicare Other | Admitting: Family Medicine

## 2022-02-08 ENCOUNTER — Other Ambulatory Visit (HOSPITAL_COMMUNITY): Payer: Self-pay | Admitting: Orthopedic Surgery

## 2022-02-08 ENCOUNTER — Ambulatory Visit (HOSPITAL_COMMUNITY)
Admission: RE | Admit: 2022-02-08 | Discharge: 2022-02-08 | Disposition: A | Discharge status: Home / Self Care | Payer: Medicare Other | Source: Ambulatory Visit | Attending: Orthopedic Surgery | Admitting: Orthopedic Surgery

## 2022-02-08 DIAGNOSIS — M79604 Pain in right leg: Secondary | ICD-10-CM

## 2022-02-08 DIAGNOSIS — M545 Low back pain, unspecified: Secondary | ICD-10-CM | POA: Diagnosis not present

## 2022-02-08 DIAGNOSIS — M25562 Pain in left knee: Secondary | ICD-10-CM | POA: Diagnosis not present

## 2022-02-08 DIAGNOSIS — M25561 Pain in right knee: Secondary | ICD-10-CM | POA: Diagnosis not present

## 2022-02-08 DIAGNOSIS — M7989 Other specified soft tissue disorders: Secondary | ICD-10-CM

## 2022-02-09 DIAGNOSIS — R262 Difficulty in walking, not elsewhere classified: Secondary | ICD-10-CM | POA: Diagnosis not present

## 2022-02-09 DIAGNOSIS — M79604 Pain in right leg: Secondary | ICD-10-CM | POA: Diagnosis not present

## 2022-02-09 DIAGNOSIS — M5136 Other intervertebral disc degeneration, lumbar region: Secondary | ICD-10-CM | POA: Diagnosis not present

## 2022-02-09 DIAGNOSIS — S8391XD Sprain of unspecified site of right knee, subsequent encounter: Secondary | ICD-10-CM | POA: Diagnosis not present

## 2022-02-09 DIAGNOSIS — M6281 Muscle weakness (generalized): Secondary | ICD-10-CM | POA: Diagnosis not present

## 2022-02-09 DIAGNOSIS — S76911D Strain of unspecified muscles, fascia and tendons at thigh level, right thigh, subsequent encounter: Secondary | ICD-10-CM | POA: Diagnosis not present

## 2022-02-15 DIAGNOSIS — M6281 Muscle weakness (generalized): Secondary | ICD-10-CM | POA: Diagnosis not present

## 2022-02-15 DIAGNOSIS — S76911D Strain of unspecified muscles, fascia and tendons at thigh level, right thigh, subsequent encounter: Secondary | ICD-10-CM | POA: Diagnosis not present

## 2022-02-15 DIAGNOSIS — S8391XD Sprain of unspecified site of right knee, subsequent encounter: Secondary | ICD-10-CM | POA: Diagnosis not present

## 2022-02-15 DIAGNOSIS — M79604 Pain in right leg: Secondary | ICD-10-CM | POA: Diagnosis not present

## 2022-02-15 DIAGNOSIS — R262 Difficulty in walking, not elsewhere classified: Secondary | ICD-10-CM | POA: Diagnosis not present

## 2022-02-15 DIAGNOSIS — M5136 Other intervertebral disc degeneration, lumbar region: Secondary | ICD-10-CM | POA: Diagnosis not present

## 2022-02-21 ENCOUNTER — Other Ambulatory Visit: Payer: Self-pay | Admitting: Family Medicine

## 2022-02-21 DIAGNOSIS — M6281 Muscle weakness (generalized): Secondary | ICD-10-CM | POA: Diagnosis not present

## 2022-02-21 DIAGNOSIS — K219 Gastro-esophageal reflux disease without esophagitis: Secondary | ICD-10-CM

## 2022-02-21 DIAGNOSIS — S8391XD Sprain of unspecified site of right knee, subsequent encounter: Secondary | ICD-10-CM | POA: Diagnosis not present

## 2022-02-21 DIAGNOSIS — S76911D Strain of unspecified muscles, fascia and tendons at thigh level, right thigh, subsequent encounter: Secondary | ICD-10-CM | POA: Diagnosis not present

## 2022-02-21 DIAGNOSIS — M79604 Pain in right leg: Secondary | ICD-10-CM | POA: Diagnosis not present

## 2022-02-21 DIAGNOSIS — M5136 Other intervertebral disc degeneration, lumbar region: Secondary | ICD-10-CM | POA: Diagnosis not present

## 2022-02-21 DIAGNOSIS — R262 Difficulty in walking, not elsewhere classified: Secondary | ICD-10-CM | POA: Diagnosis not present

## 2022-03-01 DIAGNOSIS — S8391XD Sprain of unspecified site of right knee, subsequent encounter: Secondary | ICD-10-CM | POA: Diagnosis not present

## 2022-03-01 DIAGNOSIS — M5136 Other intervertebral disc degeneration, lumbar region: Secondary | ICD-10-CM | POA: Diagnosis not present

## 2022-03-01 DIAGNOSIS — M6281 Muscle weakness (generalized): Secondary | ICD-10-CM | POA: Diagnosis not present

## 2022-03-01 DIAGNOSIS — S76911D Strain of unspecified muscles, fascia and tendons at thigh level, right thigh, subsequent encounter: Secondary | ICD-10-CM | POA: Diagnosis not present

## 2022-03-01 DIAGNOSIS — M79604 Pain in right leg: Secondary | ICD-10-CM | POA: Diagnosis not present

## 2022-03-01 DIAGNOSIS — R262 Difficulty in walking, not elsewhere classified: Secondary | ICD-10-CM | POA: Diagnosis not present

## 2022-03-03 DIAGNOSIS — H52223 Regular astigmatism, bilateral: Secondary | ICD-10-CM | POA: Diagnosis not present

## 2022-03-03 DIAGNOSIS — Z135 Encounter for screening for eye and ear disorders: Secondary | ICD-10-CM | POA: Diagnosis not present

## 2022-03-03 DIAGNOSIS — H5203 Hypermetropia, bilateral: Secondary | ICD-10-CM | POA: Diagnosis not present

## 2022-03-03 DIAGNOSIS — H524 Presbyopia: Secondary | ICD-10-CM | POA: Diagnosis not present

## 2022-03-03 DIAGNOSIS — H2513 Age-related nuclear cataract, bilateral: Secondary | ICD-10-CM | POA: Diagnosis not present

## 2022-03-14 DIAGNOSIS — M25561 Pain in right knee: Secondary | ICD-10-CM | POA: Diagnosis not present

## 2022-03-14 DIAGNOSIS — M25562 Pain in left knee: Secondary | ICD-10-CM | POA: Diagnosis not present

## 2022-03-20 DIAGNOSIS — S76911D Strain of unspecified muscles, fascia and tendons at thigh level, right thigh, subsequent encounter: Secondary | ICD-10-CM | POA: Diagnosis not present

## 2022-03-20 DIAGNOSIS — M6281 Muscle weakness (generalized): Secondary | ICD-10-CM | POA: Diagnosis not present

## 2022-03-20 DIAGNOSIS — R262 Difficulty in walking, not elsewhere classified: Secondary | ICD-10-CM | POA: Diagnosis not present

## 2022-03-20 DIAGNOSIS — S8391XD Sprain of unspecified site of right knee, subsequent encounter: Secondary | ICD-10-CM | POA: Diagnosis not present

## 2022-03-20 DIAGNOSIS — M79604 Pain in right leg: Secondary | ICD-10-CM | POA: Diagnosis not present

## 2022-03-20 DIAGNOSIS — M5136 Other intervertebral disc degeneration, lumbar region: Secondary | ICD-10-CM | POA: Diagnosis not present

## 2022-03-25 DIAGNOSIS — M25562 Pain in left knee: Secondary | ICD-10-CM | POA: Diagnosis not present

## 2022-04-04 DIAGNOSIS — M25562 Pain in left knee: Secondary | ICD-10-CM | POA: Diagnosis not present

## 2022-04-10 ENCOUNTER — Other Ambulatory Visit: Payer: Self-pay | Admitting: Family Medicine

## 2022-04-10 NOTE — Telephone Encounter (Signed)
Copied from CRM [id number]. Topic: General - Other >> Apr 10, 2022  3:24 PM Everette C wrote: Reason for CRM: Medication Refill - Medication: topiramate (TOPAMAX) 50 MG tablet [[id number]]   DULoxetine (CYMBALTA) 30 MG capsule [[id number]]   Has the patient contacted their pharmacy? No. This will be the patient's first time using this pharmacy with their PCP (Agent: If no, request that the patient contact the pharmacy for the refill. If patient does not wish to contact the pharmacy document the reason why and proceed with request.) (Agent: If yes, when and what did the pharmacy advise?)  Preferred Pharmacy (with phone number or street name): [facility] - [place], [place] - [address] Marye Round [place] [address] Phone: [phone] Fax: [phone] Hours: Not open 24 hours  Has the patient been seen for an appointment in the last year OR does the patient have an upcoming appointment? Yes.    Agent: Please be advised that RX refills may take up to 3 business days. We ask that you follow-up with your pharmacy.

## 2022-04-11 NOTE — Telephone Encounter (Signed)
Requested medications are due for refill today.  yes  Requested medications are on the active medications list.  yes  Last refill. Topamax 01/19/2022 #270 0 refills, Duloxetine  06/16/2021 #30 5 refills  Future visit scheduled.   no  Notes to clinic.  Topamax signed by Dr. Krista Blue. Duloxetine signed by Butler Denmark    Requested Prescriptions  Pending Prescriptions Disp Refills   topiramate (TOPAMAX) 50 MG tablet 270 tablet 0    Sig: TAKE 1 TABLET BY MOUTH EVERY MORNING AND 2 TABLETS AT BEDTIME     Neurology: Anticonvulsants - topiramate & zonisamide Failed - 04/10/2022  4:13 PM      Failed - ALT in normal range and within 360 days    ALT  Date Value Ref Range Status  11/12/2020 26 0 - 32 IU/L Final         Failed - AST in normal range and within 360 days    AST  Date Value Ref Range Status  11/12/2020 23 0 - 40 IU/L Final         Passed - Cr in normal range and within 360 days    Creatinine, Ser  Date Value Ref Range Status  11/29/2021 0.96 0.57 - 1.00 mg/dL Final         Passed - CO2 in normal range and within 360 days    CO2  Date Value Ref Range Status  11/29/2021 21 20 - 29 mmol/L Final   Bicarbonate  Date Value Ref Range Status  12/26/2007 21.7  Final         Passed - Completed PHQ-2 or PHQ-9 in the last 360 days      Passed - Valid encounter within last 12 months    Recent Outpatient Visits           6 months ago Essential hypertension   Primary Care at [facility], MD   7 months ago Acute pain of left knee   Primary Care at [facility], MD   1 year ago Encounter for annual physical exam   Primary Care at [facility], MD   1 year ago Encounter to establish care   Primary Care at [facility], Bayard Beaver, MD       Future Appointments             In 2 months Sueanne Margarita, MD [facility], [facility]             DULoxetine (CYMBALTA) 30 MG  capsule 30 capsule 5    Sig: Take 1 capsule (30 mg total) by mouth daily.     Psychiatry: Antidepressants - SNRI - duloxetine Failed - 04/10/2022  4:13 PM      Failed - Valid encounter within last 6 months    Recent Outpatient Visits           6 months ago Essential hypertension   Primary Care at [facility], MD   7 months ago Acute pain of left knee   Primary Care at [facility], MD   1 year ago Encounter for annual physical exam   Primary Care at [facility], Bayard Beaver, MD   1 year ago Encounter to establish care   Primary Care at [facility], Bayard Be[facility], MD       Future Appointments             In 2 months Turner,  Eber Hong, MD [facility] [address] Office, [facility]            Passed - Cr in normal range and within 360 days    Creatinine, Ser  Date Value Ref Range Status  11/29/2021 0.96 0.57 - 1.00 mg/dL Final         Passed - eGFR is 30 or above and within 360 days    GFR calc Af Amer  Date Value Ref Range Status  11/12/2020 71 >59 mL/min/1.73 Final    Comment:    **In accordance with recommendations from the NKF-ASN Task force,**   [facility] is in the process of updating its eGFR calculation to the   2021 CKD-EPI creatinine equation that estimates kidney function   without a race variable.    GFR calc non Af Amer  Date Value Ref Range Status  11/12/2020 62 >59 mL/min/1.73 Final   eGFR  Date Value Ref Range Status  11/29/2021 62 >59 mL/min/1.73 Final         Passed - Completed PHQ-2 or PHQ-9 in the last 360 days      Passed - Last BP in normal range    BP Readings from Last 1 Encounters:  11/29/21 118/70

## 2022-04-17 ENCOUNTER — Other Ambulatory Visit: Payer: Self-pay | Admitting: *Deleted

## 2022-04-17 MED ORDER — DULOXETINE HCL 30 MG PO CPEP
30.0000 mg | ORAL_CAPSULE | Freq: Every day | ORAL | 0 refills | Status: DC
Start: 2022-04-17 — End: 2022-05-17

## 2022-04-17 MED ORDER — TOPIRAMATE 50 MG PO TABS: ORAL_TABLET | ORAL | 0 refills | Status: DC

## 2022-05-16 DIAGNOSIS — H18413 Arcus senilis, bilateral: Secondary | ICD-10-CM | POA: Diagnosis not present

## 2022-05-16 DIAGNOSIS — H25013 Cortical age-related cataract, bilateral: Secondary | ICD-10-CM | POA: Diagnosis not present

## 2022-05-16 DIAGNOSIS — H2513 Age-related nuclear cataract, bilateral: Secondary | ICD-10-CM | POA: Diagnosis not present

## 2022-05-16 DIAGNOSIS — H25043 Posterior subcapsular polar age-related cataract, bilateral: Secondary | ICD-10-CM | POA: Diagnosis not present

## 2022-05-16 DIAGNOSIS — H2512 Age-related nuclear cataract, left eye: Secondary | ICD-10-CM | POA: Diagnosis not present

## 2022-05-17 ENCOUNTER — Encounter: Payer: Self-pay | Admitting: Family Medicine

## 2022-05-17 ENCOUNTER — Ambulatory Visit (INDEPENDENT_AMBULATORY_CARE_PROVIDER_SITE_OTHER): Payer: Medicare Other | Admitting: Family Medicine

## 2022-05-17 VITALS — BP 121/69 | HR 68 | Temp 98.1°F | Resp 16 | Ht 68.0 in | Wt 240.0 lb

## 2022-05-17 DIAGNOSIS — R209 Unspecified disturbances of skin sensation: Secondary | ICD-10-CM | POA: Insufficient documentation

## 2022-05-17 DIAGNOSIS — G8929 Other chronic pain: Secondary | ICD-10-CM

## 2022-05-17 DIAGNOSIS — G4730 Sleep apnea, unspecified: Secondary | ICD-10-CM | POA: Insufficient documentation

## 2022-05-17 DIAGNOSIS — M25561 Pain in right knee: Secondary | ICD-10-CM

## 2022-05-17 DIAGNOSIS — E781 Pure hyperglyceridemia: Secondary | ICD-10-CM | POA: Insufficient documentation

## 2022-05-17 DIAGNOSIS — M654 Radial styloid tenosynovitis [de Quervain]: Secondary | ICD-10-CM | POA: Insufficient documentation

## 2022-05-17 DIAGNOSIS — Z760 Encounter for issue of repeat prescription: Secondary | ICD-10-CM | POA: Insufficient documentation

## 2022-05-17 DIAGNOSIS — R7402 Elevation of levels of lactic acid dehydrogenase (LDH): Secondary | ICD-10-CM | POA: Insufficient documentation

## 2022-05-17 DIAGNOSIS — Z13820 Encounter for screening for osteoporosis: Secondary | ICD-10-CM | POA: Insufficient documentation

## 2022-05-17 DIAGNOSIS — F32A Depression, unspecified: Secondary | ICD-10-CM | POA: Insufficient documentation

## 2022-05-17 DIAGNOSIS — Z5181 Encounter for therapeutic drug level monitoring: Secondary | ICD-10-CM | POA: Insufficient documentation

## 2022-05-17 DIAGNOSIS — F339 Major depressive disorder, recurrent, unspecified: Secondary | ICD-10-CM

## 2022-05-17 DIAGNOSIS — Z789 Other specified health status: Secondary | ICD-10-CM | POA: Insufficient documentation

## 2022-05-17 DIAGNOSIS — Z01818 Encounter for other preprocedural examination: Secondary | ICD-10-CM | POA: Insufficient documentation

## 2022-05-17 DIAGNOSIS — R946 Abnormal results of thyroid function studies: Secondary | ICD-10-CM | POA: Insufficient documentation

## 2022-05-17 DIAGNOSIS — M25562 Pain in left knee: Secondary | ICD-10-CM

## 2022-05-17 DIAGNOSIS — I2699 Other pulmonary embolism without acute cor pulmonale: Secondary | ICD-10-CM | POA: Insufficient documentation

## 2022-05-17 DIAGNOSIS — Z90710 Acquired absence of both cervix and uterus: Secondary | ICD-10-CM | POA: Insufficient documentation

## 2022-05-17 DIAGNOSIS — Z7189 Other specified counseling: Secondary | ICD-10-CM | POA: Insufficient documentation

## 2022-05-17 DIAGNOSIS — G43909 Migraine, unspecified, not intractable, without status migrainosus: Secondary | ICD-10-CM | POA: Insufficient documentation

## 2022-05-17 DIAGNOSIS — G629 Polyneuropathy, unspecified: Secondary | ICD-10-CM | POA: Insufficient documentation

## 2022-05-17 DIAGNOSIS — H524 Presbyopia: Secondary | ICD-10-CM | POA: Insufficient documentation

## 2022-05-17 DIAGNOSIS — I10 Essential (primary) hypertension: Secondary | ICD-10-CM

## 2022-05-17 DIAGNOSIS — H5200 Hypermetropia, unspecified eye: Secondary | ICD-10-CM | POA: Insufficient documentation

## 2022-05-17 DIAGNOSIS — F419 Anxiety disorder, unspecified: Secondary | ICD-10-CM | POA: Insufficient documentation

## 2022-05-17 DIAGNOSIS — R197 Diarrhea, unspecified: Secondary | ICD-10-CM | POA: Insufficient documentation

## 2022-05-17 DIAGNOSIS — M79609 Pain in unspecified limb: Secondary | ICD-10-CM | POA: Insufficient documentation

## 2022-05-17 DIAGNOSIS — R319 Hematuria, unspecified: Secondary | ICD-10-CM | POA: Insufficient documentation

## 2022-05-17 DIAGNOSIS — K219 Gastro-esophageal reflux disease without esophagitis: Secondary | ICD-10-CM | POA: Insufficient documentation

## 2022-05-17 DIAGNOSIS — IMO0002 Reserved for concepts with insufficient information to code with codable children: Secondary | ICD-10-CM | POA: Insufficient documentation

## 2022-05-17 DIAGNOSIS — Z0289 Encounter for other administrative examinations: Secondary | ICD-10-CM | POA: Insufficient documentation

## 2022-05-17 DIAGNOSIS — E669 Obesity, unspecified: Secondary | ICD-10-CM | POA: Insufficient documentation

## 2022-05-17 MED ORDER — DULOXETINE HCL 30 MG PO CPEP: 30.0000 mg | ORAL_CAPSULE | Freq: Every day | ORAL | 1 refills | Status: DC
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
## 2022-05-23 ENCOUNTER — Other Ambulatory Visit: Payer: Self-pay | Admitting: Family Medicine

## 2022-05-23 ENCOUNTER — Telehealth: Payer: Self-pay | Admitting: *Deleted

## 2022-05-23 DIAGNOSIS — M25562 Pain in left knee: Secondary | ICD-10-CM | POA: Diagnosis not present

## 2022-05-23 NOTE — Telephone Encounter (Signed)
   Pre-operative Risk Assessment    Patient Name: [name]  DOB: 05-Jul-1946 MRN: [id number]      Request for Surgical Clearance    Procedure:   LEFT KNEE SCOPE  Date of Surgery:  Clearance 06/12/22                                 Surgeon:  DR. Ramond Marrow Surgeon's Group or Practice Name:  [facility] Phone number:  [phone] ATTN: Silvestre Mesi Fax number:  [phone]   Type of Clearance Requested:   - Medical ; NO MEDICATIONS LISTED AS NEEDING TO BE HELD   Type of Anesthesia:   CHOICE   Additional requests/questions:    Elpidio Anis   05/23/2022, 4:47 PM

## 2022-05-23 NOTE — Telephone Encounter (Signed)
Requested medications are due for refill today.  yes  Requested medications are on the active medications list.  yes  Last refill. 04/17/2022 #90 0 refills  Future visit scheduled.   yes  Notes to clinic.  Labs are expired.    Requested Prescriptions  Pending Prescriptions Disp Refills   topiramate (TOPAMAX) 50 MG tablet [Pharmacy Med Name: TOPIRAMATE 50 MG TABLET 50 Tablet] 90 tablet 0    Sig: TAKE 1 TABLET BY MOUTH EVERY MORNING AND 2 TABLETS AT BEDTIME FUTURE REFILLS TO BE SUPPLIED BY PRIMARY CARE PROVIDER.     Neurology: Anticonvulsants - topiramate & zonisamide Failed - 05/23/2022  9:04 AM      Failed - ALT in normal range and within 360 days    ALT  Date Value Ref Range Status  11/12/2020 26 0 - 32 IU/L Final         Failed - AST in normal range and within 360 days    AST  Date Value Ref Range Status  11/12/2020 23 0 - 40 IU/L Final         Passed - Cr in normal range and within 360 days    Creatinine, Ser  Date Value Ref Range Status  11/29/2021 0.96 0.57 - 1.00 mg/dL Final         Passed - CO2 in normal range and within 360 days    CO2  Date Value Ref Range Status  11/29/2021 21 20 - 29 mmol/L Final   Bicarbonate  Date Value Ref Range Status  12/26/2007 21.7  Final         Passed - Completed PHQ-2 or PHQ-9 in the last 360 days      Passed - Valid encounter within last 12 months    Recent Outpatient Visits           6 days ago Essential hypertension   Primary Care at [facility], MD   7 months ago Essential hypertension   Primary Care at [facility], MD   9 months ago Acute pain of left knee   Primary Care at [facility], MD   1 year ago Encounter for annual physical exam   Primary Care at [facility], Kandee Keen, MD   1 year ago Encounter to establish care   Primary Care at [facility], Kandee Keen, MD       Future Appointments             In 3 weeks  Turner, Cornelious Bryant, MD [facility] [place], [facility]   In 6 months Georganna Skeans, MD Primary Care at [facility]

## 2022-05-23 NOTE — Telephone Encounter (Signed)
   Name: [name]  DOB: [birth year]  MRN: [id number]  Primary Cardiologist: Armanda Magic, MD   Preoperative team, please contact this patient and set up a phone call appointment for further preoperative risk assessment. Please obtain consent and complete medication review. Thank you for your help.  I confirm that guidance regarding antiplatelet and oral anticoagulation therapy has been completed and, if necessary, noted below. -No request to hold meds  Tereso Newcomer, PA-C 05/23/2022, 5:54 PM [facility]

## 2022-05-24 ENCOUNTER — Telehealth: Payer: Self-pay | Admitting: *Deleted

## 2022-05-24 NOTE — Telephone Encounter (Signed)
Pt has been scheduled for a tele visit, 05/31/22 2:40.  Consent on file / medications reconciled.    Patient Consent for Virtual Visit        [name] has provided verbal consent on 05/24/2022 for a virtual visit (video or telephone).   CONSENT FOR VIRTUAL VISIT FOR:  [name]  By participating in this virtual visit I agree to the following:  I hereby voluntarily request, consent and authorize [facility] and its employed or contracted physicians, physician assistants, nurse practitioners or other licensed health care professionals (the Practitioner), to provide me with telemedicine health care services (the "Services") as deemed necessary by the treating Practitioner. I acknowledge and consent to receive the Services by the Practitioner via telemedicine. I understand that the telemedicine visit will involve communicating with the Practitioner through live audiovisual communication technology and the disclosure of certain medical information by electronic transmission. I acknowledge that I have been given the opportunity to request an in-person assessment or other available alternative prior to the telemedicine visit and am voluntarily participating in the telemedicine visit.  I understand that I have the right to withhold or withdraw my consent to the use of telemedicine in the course of my care at any time, without affecting my right to future care or treatment, and that the Practitioner or I may terminate the telemedicine visit at any time. I understand that I have the right to inspect all information obtained and/or recorded in the course of the telemedicine visit and may receive copies of available information for a reasonable fee.  I understand that some of the potential risks of receiving the Services via telemedicine include:  Delay or interruption in medical evaluation due to technological equipment failure or disruption; Information transmitted may not be sufficient (e.g.  poor resolution of images) to allow for appropriate medical decision making by the Practitioner; and/or  In rare instances, security protocols could fail, causing a breach of personal health information.  Furthermore, I acknowledge that it is my responsibility to provide information about my medical history, conditions and care that is complete and accurate to the best of my ability. I acknowledge that Practitioner's advice, recommendations, and/or decision may be based on factors not within their control, such as incomplete or inaccurate data provided by me or distortions of diagnostic images or specimens that may result from electronic transmissions. I understand that the practice of medicine is not an exact science and that Practitioner makes no warranties or guarantees regarding treatment outcomes. I acknowledge that a copy of this consent can be made available to me via my patient portal [facility] MyChart), or I can request a printed copy by calling the office of  [facility].    I understand that my insurance will be billed for this visit.   I have read or had this consent read to me. I understand the contents of this consent, which adequately explains the benefits and risks of the Services being provided via telemedicine.  I have been provided ample opportunity to ask questions regarding this consent and the Services and have had my questions answered to my satisfaction. I give my informed consent for the services to be provided through the use of telemedicine in my medical care

## 2022-05-24 NOTE — Telephone Encounter (Signed)
Pt has been scheduled for a tele visit, 05/31/22.

## 2022-05-31 ENCOUNTER — Ambulatory Visit: Payer: Medicare Other | Attending: Cardiology | Admitting: Physician Assistant

## 2022-05-31 DIAGNOSIS — Z01810 Encounter for preprocedural cardiovascular examination: Secondary | ICD-10-CM | POA: Diagnosis not present
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
## 2022-06-07 ENCOUNTER — Ambulatory Visit (INDEPENDENT_AMBULATORY_CARE_PROVIDER_SITE_OTHER): Payer: Medicare Other | Admitting: Family Medicine

## 2022-06-07 ENCOUNTER — Encounter: Payer: Self-pay | Admitting: Family Medicine

## 2022-06-07 VITALS — BP 125/78 | HR 75 | Temp 98.1°F | Resp 16 | Ht 68.0 in | Wt 237.4 lb

## 2022-06-07 DIAGNOSIS — Z01818 Encounter for other preprocedural examination: Secondary | ICD-10-CM | POA: Diagnosis not present

## 2022-06-07 DIAGNOSIS — E669 Obesity, unspecified: Secondary | ICD-10-CM | POA: Diagnosis not present

## 2022-06-07 DIAGNOSIS — Z6836 Body mass index (BMI) 36.0-36.9, adult: Secondary | ICD-10-CM
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
## 2022-06-12 DIAGNOSIS — S83242A Other tear of medial meniscus, current injury, left knee, initial encounter: Secondary | ICD-10-CM | POA: Diagnosis not present

## 2022-06-12 DIAGNOSIS — S83272A Complex tear of lateral meniscus, current injury, left knee, initial encounter: Secondary | ICD-10-CM | POA: Diagnosis not present

## 2022-06-12 DIAGNOSIS — M948X6 Other specified disorders of cartilage, lower leg: Secondary | ICD-10-CM | POA: Diagnosis not present

## 2022-06-12 DIAGNOSIS — M23232 Derangement of other medial meniscus due to old tear or injury, left knee: Secondary | ICD-10-CM | POA: Diagnosis not present

## 2022-06-12 DIAGNOSIS — Y999 Unspecified external cause status: Secondary | ICD-10-CM | POA: Diagnosis not present

## 2022-06-12 DIAGNOSIS — S83282A Other tear of lateral meniscus, current injury, left knee, initial encounter: Secondary | ICD-10-CM | POA: Diagnosis not present

## 2022-06-12 DIAGNOSIS — X58XXXA Exposure to other specified factors, initial encounter: Secondary | ICD-10-CM | POA: Diagnosis not present

## 2022-06-15 ENCOUNTER — Ambulatory Visit: Payer: Medicare Other | Admitting: Cardiology

## 2022-06-15 DIAGNOSIS — S83232D Complex tear of medial meniscus, current injury, left knee, subsequent encounter: Secondary | ICD-10-CM | POA: Diagnosis not present

## 2022-06-20 DIAGNOSIS — S83242D Other tear of medial meniscus, current injury, left knee, subsequent encounter: Secondary | ICD-10-CM | POA: Diagnosis not present

## 2022-06-20 DIAGNOSIS — S83232D Complex tear of medial meniscus, current injury, left knee, subsequent encounter: Secondary | ICD-10-CM | POA: Diagnosis not present

## 2022-06-23 DIAGNOSIS — S83232D Complex tear of medial meniscus, current injury, left knee, subsequent encounter: Secondary | ICD-10-CM | POA: Diagnosis not present

## 2022-07-03 ENCOUNTER — Ambulatory Visit: Payer: Medicare Other | Admitting: Physician Assistant

## 2022-07-05 DIAGNOSIS — S83232D Complex tear of medial meniscus, current injury, left knee, subsequent encounter: Secondary | ICD-10-CM | POA: Diagnosis not present
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
## 2022-07-06 ENCOUNTER — Other Ambulatory Visit: Payer: Self-pay | Admitting: Neurology

## 2022-07-06 ENCOUNTER — Other Ambulatory Visit: Payer: Self-pay | Admitting: Family Medicine

## 2022-07-07 ENCOUNTER — Encounter: Payer: Self-pay | Admitting: Nurse Practitioner

## 2022-07-07 ENCOUNTER — Ambulatory Visit: Payer: Medicare Other | Attending: Cardiology | Admitting: Nurse Practitioner

## 2022-07-07 VITALS — BP 130/80 | HR 68 | Ht 68.0 in | Wt 238.8 lb

## 2022-07-07 DIAGNOSIS — I10 Essential (primary) hypertension: Secondary | ICD-10-CM

## 2022-07-07 DIAGNOSIS — R001 Bradycardia, unspecified: Secondary | ICD-10-CM | POA: Diagnosis not present

## 2022-07-07 DIAGNOSIS — I5032 Chronic diastolic (congestive) heart failure: Secondary | ICD-10-CM

## 2022-07-07 DIAGNOSIS — R600 Localized edema: Secondary | ICD-10-CM

## 2022-07-07 DIAGNOSIS — I951 Orthostatic hypotension: Secondary | ICD-10-CM

## 2022-07-07 NOTE — Patient Instructions (Signed)
Medication Instructions:   Your physician recommends that you continue on your current medications as directed. Please refer to the Current Medication list given to you today.   *If you need a refill on your cardiac medications before your next appointment, please call your pharmacy*   Lab Work: [place]   If you have labs (blood work) drawn today and your tests are completely normal, you will receive your results only by: [place] (if you have MyChart) OR A paper copy in the mail If you have any lab test that is abnormal or we need to change your treatment, we will call you to review the results.   Testing/Procedures: NONE ORDERED  TODAY     Follow-Up: At [facility], you and your health needs are our priority.  As part of our continuing mission to provide you with exceptional heart care, we have created designated Provider Care Teams.  These Care Teams include your primary Cardiologist (physician) and Advanced Practice Providers (APPs -  Physician Assistants and Nurse Practitioners) who all work together to provide you with the care you need, when you need it.  We recommend signing up for the patient portal called "MyChart".  Sign up information is provided on this After Visit Summary.  MyChart is used to connect with patients for Virtual Visits (Telemedicine).  Patients are able to view lab/test results, encounter notes, upcoming appointments, etc.  Non-urgent messages can be sent to your provider as well.   To learn more about what you can do with MyChart, go to [email or web address].    Your next appointment:   1 year(s)  The format for your next appointment:   In Person  Provider:   Fransico Him, MD  OR Ambrose Pancoast NP    Other Instructions Low-Sodium Eating Plan Sodium, which is an element that makes up salt, helps you maintain a healthy balance of fluids in your body. Too much sodium can increase your blood pressure and cause fluid and waste  to be held in your body. Your health care provider or dietitian may recommend following this plan if you have high blood pressure (hypertension), kidney disease, liver disease, or heart failure. Eating less sodium can help lower your blood pressure, reduce swelling, and protect your heart, liver, and kidneys. What are tips for following this plan? Reading food labels The Nutrition Facts label lists the amount of sodium in one serving of the food. If you eat more than one serving, you must multiply the listed amount of sodium by the number of servings. Choose foods with less than 140 mg of sodium per serving. Avoid foods with 300 mg of sodium or more per serving. Shopping  Look for lower-sodium products, often labeled as "low-sodium" or "no salt added." Always check the sodium content, even if foods are labeled as "unsalted" or "no salt added." Buy fresh foods. Avoid canned foods and pre-made or frozen meals. Avoid canned, cured, or processed meats. Buy breads that have less than 80 mg of sodium per slice. Cooking  Eat more home-cooked food and less restaurant, buffet, and fast food. Avoid adding salt when cooking. Use salt-free seasonings or herbs instead of table salt or sea salt. Check with your health care provider or pharmacist before using salt substitutes. Cook with plant-based oils, such as canola, sunflower, or olive oil. Meal planning When eating at a restaurant, ask that your food be prepared with less salt or no salt, if possible. Avoid dishes labeled as brined, pickled, cured,  smoked, or made with soy sauce, miso, or teriyaki sauce. Avoid foods that contain MSG (monosodium glutamate). MSG is sometimes added to [place] food, bouillon, and some canned foods. Make meals that can be grilled, baked, poached, roasted, or steamed. These are generally made with less sodium. General information Most people on this plan should limit their sodium intake to 1,500-2,000 mg (milligrams) of  sodium each day. What foods should I eat? Fruits Fresh, frozen, or canned fruit. Fruit juice. Vegetables Fresh or frozen vegetables. "No salt added" canned vegetables. "No salt added" tomato sauce and paste. Low-sodium or reduced-sodium tomato and vegetable juice. Grains Low-sodium cereals, including oats, puffed wheat and rice, and shredded wheat. Low-sodium crackers. Unsalted rice. Unsalted pasta. Low-sodium bread. Whole-grain breads and whole-grain pasta. Meats and other proteins Fresh or frozen (no salt added) meat, poultry, seafood, and fish. Low-sodium canned tuna and salmon. Unsalted nuts. Dried peas, beans, and lentils without added salt. Unsalted canned beans. Eggs. Unsalted nut butters. Dairy Milk. Soy milk. Cheese that is naturally low in sodium, such as ricotta cheese, fresh mozzarella, or Swiss cheese. Low-sodium or reduced-sodium cheese. Cream cheese. Yogurt. Seasonings and condiments Fresh and dried herbs and spices. Salt-free seasonings. Low-sodium mustard and ketchup. Sodium-free salad dressing. Sodium-free light mayonnaise. Fresh or refrigerated horseradish. Lemon juice. Vinegar. Other foods Homemade, reduced-sodium, or low-sodium soups. Unsalted popcorn and pretzels. Low-salt or salt-free chips. The items listed above may not be a complete list of foods and beverages you can eat. Contact a dietitian for more information. What foods should I avoid? Vegetables Sauerkraut, pickled vegetables, and relishes. Olives. [place] fries. Onion rings. Regular canned vegetables (not low-sodium or reduced-sodium). Regular canned tomato sauce and paste (not low-sodium or reduced-sodium). Regular tomato and vegetable juice (not low-sodium or reduced-sodium). Frozen vegetables in sauces. Grains Instant hot cereals. Bread stuffing, pancake, and biscuit mixes. Croutons. Seasoned rice or pasta mixes. Noodle soup cups. Boxed or frozen macaroni and cheese. Regular salted crackers. Self-rising  flour. Meats and other proteins Meat or fish that is salted, canned, smoked, spiced, or pickled. Precooked or cured meat, such as sausages or meat loaves. Bernie[place] Salines. Ham. Pepperoni. Hot dogs. Corned beef. Chipped beef. Salt pork. Jerky. Pickled herring. Anchovies and sardines. Regular canned tuna. Salted nuts. Dairy Processed cheese and cheese spreads. Hard cheeses. Cheese curds. Blue cheese. Feta cheese. String cheese. Regular cottage cheese. Buttermilk. Canned milk. Fats and oils Salted butter. Regular margarine. Ghee. Bacon fat. Seasonings and condiments Onion salt, garlic salt, seasoned salt, table salt, and sea salt. Canned and packaged gravies. Worcestershire sauce. Tartar sauce. Barbecue sauce. Teriyaki sauce. Soy sauce, including reduced-sodium. Steak sauce. Fish sauce. Oyster sauce. Cocktail sauce. Horseradish that you find on the shelf. Regular ketchup and mustard. Meat flavorings and tenderizers. Bouillon cubes. Hot sauce. Pre-made or packaged marinades. Pre-made or packaged taco seasonings. Relishes. Regular salad dressings. Salsa. Other foods Salted popcorn and pretzels. Corn chips and puffs. Potato and tortilla chips. Canned or dried soups. Pizza. Frozen entrees and pot pies. The items listed above may not be a complete list of foods and beverages you should avoid. Contact a dietitian for more information. Summary Eating less sodium can help lower your blood pressure, reduce swelling, and protect your heart, liver, and kidneys. Most people on this plan should limit their sodium intake to 1,500-2,000 mg (milligrams) of sodium each day. Canned, boxed, and frozen foods are high in sodium. Restaurant foods, fast foods, and pizza are also very high in sodium. You also get sodium by adding salt to  food. Try to cook at home, eat more fresh fruits and vegetables, and eat less fast food and canned, processed, or prepared foods. This information is not intended to replace advice given to you by your  health care provider. Make sure you discuss any questions you have with your health care provider. Document Revised: 10/17/2019 Document Reviewed: 08/13/2019 Elsevier Patient Education  [place]

## 2022-07-11 DIAGNOSIS — S83232D Complex tear of medial meniscus, current injury, left knee, subsequent encounter: Secondary | ICD-10-CM | POA: Diagnosis not present

## 2022-07-14 ENCOUNTER — Ambulatory Visit (INDEPENDENT_AMBULATORY_CARE_PROVIDER_SITE_OTHER): Payer: Medicare Other

## 2022-07-14 DIAGNOSIS — Z0000 Encounter for general adult medical examination without abnormal findings: Secondary | ICD-10-CM
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
## 2022-07-14 NOTE — Patient Instructions (Signed)

## 2022-07-28 DIAGNOSIS — H2511 Age-related nuclear cataract, right eye: Secondary | ICD-10-CM | POA: Diagnosis not present

## 2022-07-28 DIAGNOSIS — H25812 Combined forms of age-related cataract, left eye: Secondary | ICD-10-CM | POA: Diagnosis not present

## 2022-07-28 DIAGNOSIS — H2512 Age-related nuclear cataract, left eye: Secondary | ICD-10-CM | POA: Diagnosis not present

## 2022-08-11 DIAGNOSIS — M25561 Pain in right knee: Secondary | ICD-10-CM | POA: Diagnosis not present

## 2022-08-11 DIAGNOSIS — M25562 Pain in left knee: Secondary | ICD-10-CM | POA: Diagnosis not present

## 2022-08-23 ENCOUNTER — Other Ambulatory Visit: Payer: Self-pay | Admitting: Family Medicine

## 2022-08-23 ENCOUNTER — Other Ambulatory Visit: Payer: Self-pay | Admitting: Neurology

## 2022-08-23 DIAGNOSIS — K219 Gastro-esophageal reflux disease without esophagitis: Secondary | ICD-10-CM

## 2022-08-25 DIAGNOSIS — H2511 Age-related nuclear cataract, right eye: Secondary | ICD-10-CM | POA: Diagnosis not present

## 2022-09-26 DIAGNOSIS — M25562 Pain in left knee: Secondary | ICD-10-CM | POA: Diagnosis not present

## 2022-10-27 DIAGNOSIS — M25561 Pain in right knee: Secondary | ICD-10-CM | POA: Diagnosis not present

## 2022-10-27 DIAGNOSIS — M25562 Pain in left knee: Secondary | ICD-10-CM | POA: Diagnosis not present

## 2022-11-02 ENCOUNTER — Telehealth: Payer: Self-pay | Admitting: *Deleted

## 2022-11-02 DIAGNOSIS — M25562 Pain in left knee: Secondary | ICD-10-CM | POA: Diagnosis not present

## 2022-11-02 DIAGNOSIS — M25561 Pain in right knee: Secondary | ICD-10-CM | POA: Diagnosis not present

## 2022-11-02 NOTE — Telephone Encounter (Signed)
Left message for the pt to call back to schedule a tele pre op appt 

## 2022-11-02 NOTE — Telephone Encounter (Signed)
I s/w the pt and she has been schedule for tele pre op appt 01/23/23 @ 9 am. Pt tells me that she is not having surgery until May or June 2024 and the surgery scheduler will not give her a date until they have clearance. I informed the pt that the clearance will only be good for 2 months once we clear her. My suggestion was to schedule the tele pre op appt at the end of April and then she is good until the end of June for her surgery. Pt is agreeable to plan of care. I assured the pt that I will update the surgeon office as well. This will be able to help them set up a tentative date for surgery. Med rec and consent are done.      Patient Consent for Virtual Visit        [name] has provided verbal consent on 11/02/2022 for a virtual visit (video or telephone).   CONSENT FOR VIRTUAL VISIT FOR:  [name]  By participating in this virtual visit I agree to the following:  I hereby voluntarily request, consent and authorize [facility] and its employed or contracted physicians, physician assistants, nurse practitioners or other licensed health care professionals (the Practitioner), to provide me with telemedicine health care services (the "Services") as deemed necessary by the treating Practitioner. I acknowledge and consent to receive the Services by the Practitioner via telemedicine. I understand that the telemedicine visit will involve communicating with the Practitioner through live audiovisual communication technology and the disclosure of certain medical information by electronic transmission. I acknowledge that I have been given the opportunity to request an in-person assessment or other available alternative prior to the telemedicine visit and am voluntarily participating in the telemedicine visit.  I understand that I have the right to withhold or withdraw my consent to the use of telemedicine in the course of my care at any time, without affecting my right to future care or  treatment, and that the Practitioner or I may terminate the telemedicine visit at any time. I understand that I have the right to inspect all information obtained and/or recorded in the course of the telemedicine visit and may receive copies of available information for a reasonable fee.  I understand that some of the potential risks of receiving the Services via telemedicine include:  Delay or interruption in medical evaluation due to technological equipment failure or disruption; Information transmitted may not be sufficient (e.g. poor resolution of images) to allow for appropriate medical decision making by the Practitioner; and/or  In rare instances, security protocols could fail, causing a breach of personal health information.  Furthermore, I acknowledge that it is my responsibility to provide information about my medical history, conditions and care that is complete and accurate to the best of my ability. I acknowledge that Practitioner's advice, recommendations, and/or decision may be based on factors not within their control, such as incomplete or inaccurate data provided by me or distortions of diagnostic images or specimens that may result from electronic transmissions. I understand that the practice of medicine is not an exact science and that Practitioner makes no warranties or guarantees regarding treatment outcomes. I acknowledge that a copy of this consent can be made available to me via my patient portal ([facility]), or I can request a printed copy by calling the office of [facility].    I understand that my insurance will be billed for this visit.   I have  read or had this consent read to me. I understand the contents of this consent, which adequately explains the benefits and risks of the Services being provided via telemedicine.  I have been provided ample opportunity to ask questions regarding this consent and the Services and have had my questions answered to my  satisfaction. I give my informed consent for the services to be provided through the use of telemedicine in my medical care

## 2022-11-02 NOTE — Telephone Encounter (Signed)
   Name: [name]  DOB: 11/01/1945  MRN: [id number]  Primary Cardiologist: Fransico Him, MD   Preoperative team, please contact this patient and set up a phone call appointment for further preoperative risk assessment. Please obtain consent and complete medication review. Thank you for your help.  I confirm that guidance regarding antiplatelet and oral anticoagulation therapy has been completed and, if necessary, noted below.   Per DAPT protocol patient is okay to hold aspirin 7 days prior to procedure and resume when safest post surgery.  Mable Fill, Marissa Nestle, NP 11/02/2022, 11:01 AM [place]

## 2022-11-02 NOTE — Telephone Encounter (Signed)
   Pre-operative Risk Assessment    Patient Name: [name]  DOB: 1945/12/28 MRN: [id number]      Request for Surgical Clearance    Procedure:   LEFT TOTAL KNEE REPLACEMENT  Date of Surgery:  Clearance TBD                                 Surgeon:  Charlies Constable, MD Surgeon's Group or Practice Name:  [facility] Phone number:  [phone] Fax number:  [phone]   Type of Clearance Requested:   - Medical  - Pharmacy:  Hold Aspirin NOT INDICATED HOW LONG   Type of Anesthesia:  Spinal   Additional requests/questions:    Astrid Divine   11/02/2022, 10:10 AM

## 2022-11-02 NOTE — Telephone Encounter (Signed)
I s/w the pt and she has been schedule for tele pre op appt 01/23/23 @ 9 am. Pt tells me that she is not having surgery until May or June 2024 and the surgery scheduler will not give her a date until they have clearance. I informed the pt that the clearance will only be good for 2 months once we clear her. My suggestion was to schedule the tele pre op appt at the end of April and then she is good until the end of June for her surgery. Pt is agreeable to plan of care. I assured the pt that I will update the surgeon office as well. This will be able to help them set up a tentative date for surgery. Med rec and consent are done.

## 2022-11-09 ENCOUNTER — Other Ambulatory Visit: Payer: Self-pay | Admitting: Family Medicine

## 2022-11-09 NOTE — Telephone Encounter (Signed)
Requested Prescriptions  Pending Prescriptions Disp Refills   DULoxetine (CYMBALTA) 30 MG capsule [Pharmacy Med Name: DULOXETINE HCL 30 MG CPEP 30 Capsule] 90 capsule 0    Sig: TAKE 1 CAPSULE (30 MG TOTAL) BY MOUTH DAILY.     Psychiatry: Antidepressants - SNRI - duloxetine Passed - 11/09/2022  9:36 AM      Passed - Cr in normal range and within 360 days    Creatinine, Ser  Date Value Ref Range Status  11/29/2021 0.96 0.57 - 1.00 mg/dL Final         Passed - eGFR is 30 or above and within 360 days    GFR calc Af Amer  Date Value Ref Range Status  11/12/2020 71 >59 mL/min/1.73 Final    Comment:    **In accordance with recommendations from the NKF-ASN Task force,**   [facility] is in the process of updating its eGFR calculation to the   2021 CKD-EPI creatinine equation that estimates kidney function   without a race variable.    GFR calc non Af Amer  Date Value Ref Range Status  11/12/2020 62 >59 mL/min/1.73 Final   eGFR  Date Value Ref Range Status  11/29/2021 62 >59 mL/min/1.73 Final         Passed - Completed PHQ-2 or PHQ-9 in the last 360 days      Passed - Last BP in normal range    BP Readings from Last 1 Encounters:  07/07/22 130/80         Passed - Valid encounter within last 6 months    Recent Outpatient Visits           5 months ago Preop examination   [facility], [place]   5 months ago Essential hypertension   [facility] [facility], MD   1 year ago Essential hypertension   [place] [facility], MD   1 year ago Acute pain of left knee    [facility] at [facility], MD   1 year ago Encounter for annual physical exam   [place] [facility], MD       Future Appointments             In 1 week Dorna Mai, MD [facility] [facility]

## 2022-11-20 ENCOUNTER — Ambulatory Visit (INDEPENDENT_AMBULATORY_CARE_PROVIDER_SITE_OTHER): Payer: Medicare Other | Admitting: Family Medicine

## 2022-11-20 ENCOUNTER — Encounter: Payer: Self-pay | Admitting: Family Medicine

## 2022-11-20 VITALS — BP 142/86 | HR 68 | Temp 97.3°F | Resp 16 | Ht 68.0 in | Wt 242.0 lb

## 2022-11-20 DIAGNOSIS — Z780 Asymptomatic menopausal state: Secondary | ICD-10-CM

## 2022-11-20 DIAGNOSIS — Z13820 Encounter for screening for osteoporosis: Secondary | ICD-10-CM | POA: Diagnosis not present

## 2022-11-20 DIAGNOSIS — Z13220 Encounter for screening for lipoid disorders: Secondary | ICD-10-CM

## 2022-11-20 DIAGNOSIS — Z0000 Encounter for general adult medical examination without abnormal findings: Secondary | ICD-10-CM | POA: Diagnosis not present

## 2022-11-20 MED ORDER — DULOXETINE HCL 30 MG PO CPEP: 30.0000 mg | ORAL_CAPSULE | Freq: Every day | ORAL | 1 refills | Status: DC

## 2022-11-20 MED ORDER — TOPIRAMATE 50 MG PO TABS: ORAL_TABLET | ORAL | 1 refills | Status: DC

## 2022-11-20 MED ORDER — TIZANIDINE HCL 4 MG PO TABS: 4.0000 mg | ORAL_TABLET | Freq: Four times a day (QID) | ORAL | 6 refills | Status: DC | PRN

## 2022-11-20 MED ORDER — SUMATRIPTAN SUCCINATE 100 MG PO TABS: 100.0000 mg | ORAL_TABLET | ORAL | 5 refills | Status: DC | PRN

## 2022-11-20 NOTE — Progress Notes (Signed)
Patient is here for her yearly complete physical examination. Patient has no other concerns today.
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
## 2022-11-21 ENCOUNTER — Other Ambulatory Visit: Payer: Self-pay | Admitting: Family Medicine

## 2022-11-21 DIAGNOSIS — K219 Gastro-esophageal reflux disease without esophagitis: Secondary | ICD-10-CM

## 2022-11-21 MED ORDER — OMEPRAZOLE 20 MG PO CPDR: DELAYED_RELEASE_CAPSULE | ORAL | 0 refills | Status: DC

## 2022-11-21 NOTE — Telephone Encounter (Signed)
Medication Refill - Medication: omeprazole (PRILOSEC) 20 MG capsule   Has the patient contacted their pharmacy? No. No, more refills.   (Agent: If no, request that the patient contact the pharmacy for the refill. If patient does not wish to contact the pharmacy document the reason why and proceed with request.)   Preferred Pharmacy (with phone number or street name):  [place], [address]  [place] [address]  Phone: [phone] Fax: [phone]  Hours: Not open 24 hours   Has the patient been seen for an appointment in the last year OR does the patient have an upcoming appointment? Yes.    Agent: Please be advised that RX refills may take up to 3 business days. We ask that you follow-up with your pharmacy.

## 2022-11-21 NOTE — Telephone Encounter (Signed)
Requested Prescriptions  Pending Prescriptions Disp Refills   omeprazole (PRILOSEC) 20 MG capsule 90 capsule 0    Sig: TAKE 1 CAPSULE BY MOUTH DAILY. OFFICE VISIT REQUIRED PRIOR TO ANY FURTHER REFILLS     Gastroenterology: Proton Pump Inhibitors Passed - 11/21/2022  9:21 AM      Passed - Valid encounter within last 12 months    Recent Outpatient Visits           Yesterday Annual physical exam   [facility], MD   5 months ago Preop examination   [facility], MD   6 months ago Essential hypertension   [facility], MD   1 year ago Essential hypertension   [facility], MD   1 year ago Acute pain of left knee   [facility], MD       Future Appointments             In 6 months Dorna Mai, MD [facility]

## 2022-11-23 LAB — CMP14+EGFR
ALT: 20 IU/L (ref 0–32)
AST: 20 IU/L (ref 0–40)
Albumin/Globulin Ratio: 1.7 (ref 1.2–2.2)
Albumin: 4.2 g/dL (ref 3.8–4.8)
Alkaline Phosphatase: 128 IU/L — ABNORMAL HIGH (ref 44–121)
BUN/Creatinine Ratio: 23 (ref 12–28)
BUN: 17 mg/dL (ref 8–27)
Bilirubin Total: 0.6 mg/dL (ref 0.0–1.2)
CO2: 24 mmol/L (ref 20–29)
Calcium: 9.5 mg/dL (ref 8.7–10.3)
Chloride: 103 mmol/L (ref 96–106)
Creatinine, Ser: 0.75 mg/dL (ref 0.57–1.00)
Globulin, Total: 2.5 g/dL (ref 1.5–4.5)
Glucose: 90 mg/dL (ref 70–99)
Potassium: 4.9 mmol/L (ref 3.5–5.2)
Sodium: 143 mmol/L (ref 134–144)
Total Protein: 6.7 g/dL (ref 6.0–8.5)
eGFR: 83 mL/min/{1.73_m2} (ref >59)

## 2022-11-23 LAB — LIPID PANEL
Chol/HDL Ratio: 2.4 ratio (ref 0.0–4.4)
Cholesterol, Total: 155 mg/dL (ref 100–199)
HDL: 65 mg/dL (ref >39)
LDL Chol Calc (NIH): 72 mg/dL (ref 0–99)
Triglycerides: 99 mg/dL (ref 0–149)
VLDL Cholesterol Cal: 18 mg/dL (ref 5–40)

## 2022-11-23 LAB — SPECIMEN STATUS REPORT

## 2022-12-26 ENCOUNTER — Other Ambulatory Visit: Payer: Medicare Other

## 2023-01-03 ENCOUNTER — Other Ambulatory Visit: Payer: Self-pay | Admitting: Cardiology

## 2023-01-03 ENCOUNTER — Other Ambulatory Visit: Payer: Self-pay | Admitting: Neurology

## 2023-01-04 NOTE — Telephone Encounter (Signed)
Requested Prescriptions   Pending Prescriptions Disp Refills   gabapentin (NEURONTIN) 300 MG capsule [Pharmacy Med Name: GABAPENTIN 300 MG CAPS 300 Capsule] 270 capsule 3    Sig: TAKE 1 CAPSULE BY MOUTH 3 TIMES DAILY.   Last seen 07/05/21, no upcoming appt scheduled. Refusing refill

## 2023-01-10 ENCOUNTER — Other Ambulatory Visit: Payer: Self-pay | Admitting: Family Medicine

## 2023-01-10 ENCOUNTER — Other Ambulatory Visit: Payer: Self-pay | Admitting: Neurology

## 2023-01-10 MED ORDER — GABAPENTIN 300 MG PO CAPS: 300.0000 mg | ORAL_CAPSULE | Freq: Three times a day (TID) | ORAL | 3 refills | Status: DC
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
## 2023-01-23 ENCOUNTER — Ambulatory Visit: Payer: Medicare Other | Attending: Cardiovascular Disease

## 2023-01-23 DIAGNOSIS — Z01810 Encounter for preprocedural cardiovascular examination: Secondary | ICD-10-CM

## 2023-01-25 ENCOUNTER — Ambulatory Visit (INDEPENDENT_AMBULATORY_CARE_PROVIDER_SITE_OTHER): Payer: Medicare Other | Admitting: Family Medicine

## 2023-01-25 VITALS — BP 114/73 | HR 73 | Temp 97.7°F | Resp 16 | Ht 68.0 in | Wt 242.0 lb

## 2023-01-25 DIAGNOSIS — Z01818 Encounter for other preprocedural examination: Secondary | ICD-10-CM | POA: Diagnosis not present
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
## 2023-01-30 ENCOUNTER — Encounter: Payer: Self-pay | Admitting: Family Medicine

## 2023-02-07 ENCOUNTER — Other Ambulatory Visit: Payer: Self-pay | Admitting: Family Medicine

## 2023-02-07 ENCOUNTER — Other Ambulatory Visit: Payer: Self-pay | Admitting: Cardiology

## 2023-02-12 ENCOUNTER — Other Ambulatory Visit: Payer: Self-pay | Admitting: Family Medicine

## 2023-02-12 DIAGNOSIS — K219 Gastro-esophageal reflux disease without esophagitis: Secondary | ICD-10-CM

## 2023-02-13 DIAGNOSIS — S83232D Complex tear of medial meniscus, current injury, left knee, subsequent encounter: Secondary | ICD-10-CM | POA: Diagnosis not present

## 2023-02-13 NOTE — Progress Notes (Signed)
Surgery orders requested via Epic inbox. °

## 2023-02-14 NOTE — Progress Notes (Addendum)
Anesthesia Review:  PCP:  Georganna Skeans  LOV 01/25/23  Cardiologist :Armanda Magic 01/23/23 preop clear- Robin Searing, NP LOV 10/13/23Robin Searing, NP  Chest x-ray : EKG : 01/25/23  Echo :2022  Stress test: Cardiac Cath :  Activity level: can do a flight of stairs without difficutly  Sleep Study/ CPAP : none  Fasting Blood Sugar :      / Checks Blood Sugar -- times a day:   Blood Thinner/ Instructions /Last Dose: ASA / Instructions/ Last Dose :    81 mg aspirin

## 2023-02-15 ENCOUNTER — Ambulatory Visit (HOSPITAL_COMMUNITY): Payer: Self-pay | Admitting: Emergency Medicine

## 2023-02-15 DIAGNOSIS — G8929 Other chronic pain: Secondary | ICD-10-CM
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
## 2023-02-20 NOTE — Patient Instructions (Addendum)
SURGICAL WAITING ROOM VISITATION  Patients having surgery or a procedure may have no more than 2 support people in the waiting area - these visitors may rotate.    Children under the age of 49 must have an adult with them who is not the patient.  Due to an increase in RSV and influenza rates and associated hospitalizations, children ages 50 and under may not visit patients in [facility] hospitals.  If the patient needs to stay at the hospital during part of their recovery, the visitor guidelines for inpatient rooms apply. Pre-op nurse will coordinate an appropriate time for 1 support person to accompany patient in pre-op.  This support person may not rotate.    Please refer to the [facility] website for the visitor guidelines for Inpatients (after your surgery is over and you are in a regular room).       Your procedure is scheduled on:  03/05/2023    Report to [facility] Main Entrance    Report to admitting at  0800 AM   Call this number if you have problems the morning of surgery [phone]   Do not eat food :After Midnight.   After Midnight you may have the following liquids until ______ AM/ PM DAY OF SURGERY  Water Non-Citrus Juices (without pulp, NO RED-Apple, White grape, White cranberry) Black Coffee (NO MILK/CREAM OR CREAMERS, sugar ok)  Clear Tea (NO MILK/CREAM OR CREAMERS, sugar ok) regular and decaf                             Plain Jell-O (NO RED)                                           Fruit ices (not with fruit pulp, NO RED)                                     Popsicles (NO RED)                                                               Sports drinks like Gatorade (NO RED)              Drink 2 Ensure/G2 drinks AT 10:00 PM the night before surgery.        The day of surgery:  Drink ONE (1) Pre-Surgery Clear Ensure or G2 at AM the morning of surgery. Drink in one sitting. Do not sip.  This drink was given to you during your hospital  pre-op  appointment visit. Nothing else to drink after completing the  Pre-Surgery Clear Ensure or G2.          If you have questions, please contact your surgeon's office.   FOLLOW BOWEL PREP AND ANY ADDITIONAL PRE OP INSTRUCTIONS YOU RECEIVED FROM YOUR SURGEON'S OFFICE!!!     Oral Hygiene is also important to reduce your risk of infection.  Remember - BRUSH YOUR TEETH THE MORNING OF SURGERY WITH YOUR REGULAR TOOTHPASTE  DENTURES WILL BE REMOVED PRIOR TO SURGERY PLEASE DO NOT APPLY "Poly grip" OR ADHESIVES!!!   Do NOT smoke after Midnight   Take these medicines the morning of surgery with A SIP OF WATER:  cymbalta, gabapentin, omeprazole topamax   DO NOT TAKE ANY ORAL DIABETIC MEDICATIONS DAY OF YOUR SURGERY  Bring CPAP mask and tubing day of surgery.                              You may not have any metal on your body including hair pins, jewelry, and body piercing             Do not wear make-up, lotions, powders, perfumes/cologne, or deodorant  Do not wear nail polish including gel and S&S, artificial/acrylic nails, or any other type of covering on natural nails including finger and toenails. If you have artificial nails, gel coating, etc. that needs to be removed by a nail salon please have this removed prior to surgery or surgery may need to be canceled/ delayed if the surgeon/ anesthesia feels like they are unable to be safely monitored.   Do not shave  48 hours prior to surgery.               Men may shave face and neck.   Do not bring valuables to the hospital.  IS NOT             RESPONSIBLE   FOR VALUABLES.   Contacts, glasses, dentures or bridgework may not be worn into surgery.   Bring small overnight bag day of surgery.   DO NOT BRING YOUR HOME MEDICATIONS TO THE HOSPITAL. PHARMACY WILL DISPENSE MEDICATIONS LISTED ON YOUR MEDICATION LIST TO YOU DURING YOUR ADMISSION IN THE HOSPITAL!    Patients discharged on the day of  surgery will not be allowed to drive home.  Someone NEEDS to stay with you for the first 24 hours after anesthesia.   Special Instructions: Bring a copy of your healthcare power of attorney and living will documents the day of surgery if you haven't scanned them before.              Please read over the following fact sheets you were given: IF YOU HAVE QUESTIONS ABOUT YOUR PRE-OP INSTRUCTIONS PLEASE CALL [phone]   If you received a COVID test during your pre-op visit  it is requested that you wear a mask when out in public, stay away from anyone that may not be feeling well and notify your surgeon if you develop symptoms. If you test positive for Covid or have been in contact with anyone that has tested positive in the last 10 days please notify you surgeon.      Pre-operative 5 CHG Bath Instructions   You can play a key role in reducing the risk of infection after surgery. Your skin needs to be as free of germs as possible. You can reduce the number of germs on your skin by washing with CHG (chlorhexidine gluconate) soap before surgery. CHG is an antiseptic soap that kills germs and continues to kill germs even after washing.   DO NOT use if you have an allergy to chlorhexidine/CHG or antibacterial soaps. If your skin becomes reddened or irritated, stop using the CHG and notify one of our RNs at [phone].   Please shower with the CHG soap  starting 4 days before surgery using the following schedule:     Please keep in mind the following:  DO NOT shave, including legs and underarms, starting the day of your first shower.   You may shave your face at any point before/day of surgery.  Place clean sheets on your bed the day you start using CHG soap. Use a clean washcloth (not used since being washed) for each shower. DO NOT sleep with pets once you start using the CHG.   CHG Shower Instructions:  If you choose to wash your hair and private area, wash first with your normal  shampoo/soap.  After you use shampoo/soap, rinse your hair and body thoroughly to remove shampoo/soap residue.  Turn the water OFF and apply about 3 tablespoons (45 ml) of CHG soap to a CLEAN washcloth.  Apply CHG soap ONLY FROM YOUR NECK DOWN TO YOUR TOES (washing for 3-5 minutes)  DO NOT use CHG soap on face, private areas, open wounds, or sores.  Pay special attention to the area where your surgery is being performed.  If you are having back surgery, having someone wash your back for you may be helpful. Wait 2 minutes after CHG soap is applied, then you may rinse off the CHG soap.  Pat dry with a clean towel  Put on clean clothes/pajamas   If you choose to wear lotion, please use ONLY the CHG-compatible lotions on the back of this paper.     Additional instructions for the day of surgery: DO NOT APPLY any lotions, deodorants, cologne, or perfumes.   Put on clean/comfortable clothes.  Brush your teeth.  Ask your nurse before applying any prescription medications to the skin.      CHG Compatible Lotions   Aveeno Moisturizing lotion  Cetaphil Moisturizing Cream  Cetaphil Moisturizing Lotion  Clairol Herbal Essence Moisturizing Lotion, Dry Skin  Clairol Herbal Essence Moisturizing Lotion, Extra Dry Skin  Clairol Herbal Essence Moisturizing Lotion, Normal Skin  Curel Age Defying Therapeutic Moisturizing Lotion with Alpha Hydroxy  Curel Extreme Care Body Lotion  Curel Soothing Hands Moisturizing Hand Lotion  Curel Therapeutic Moisturizing Cream, Fragrance-Free  Curel Therapeutic Moisturizing Lotion, Fragrance-Free  Curel Therapeutic Moisturizing Lotion, Original Formula  Eucerin Daily Replenishing Lotion  Eucerin Dry Skin Therapy Plus Alpha Hydroxy Crme  Eucerin Dry Skin Therapy Plus Alpha Hydroxy Lotion  Eucerin Original Crme  Eucerin Original Lotion  Eucerin Plus Crme Eucerin Plus Lotion  Eucerin TriLipid Replenishing Lotion  Keri Anti-Bacterial Hand Lotion  Keri Deep  Conditioning Original Lotion Dry Skin Formula Softly Scented  Keri Deep Conditioning Original Lotion, Fragrance Free Sensitive Skin Formula  Keri Lotion Fast Absorbing Fragrance Free Sensitive Skin Formula  Keri Lotion Fast Absorbing Softly Scented Dry Skin Formula  Keri Original Lotion  Keri Skin Renewal Lotion Keri Silky Smooth Lotion  Keri Silky Smooth Sensitive Skin Lotion  Nivea Body Creamy Conditioning Oil  Nivea Body Extra Enriched Teacher, adult education Moisturizing Lotion Nivea Crme  Nivea Skin Firming Lotion  NutraDerm 30 Skin Lotion  NutraDerm Skin Lotion  NutraDerm Therapeutic Skin Cream  NutraDerm Therapeutic Skin Lotion  ProShield Protective Hand Cream  Provon moisturizing lotion

## 2023-02-21 ENCOUNTER — Other Ambulatory Visit: Payer: Self-pay | Admitting: Cardiology

## 2023-02-21 ENCOUNTER — Encounter (HOSPITAL_COMMUNITY)
Admission: RE | Admit: 2023-02-21 | Discharge: 2023-02-21 | Disposition: A | Discharge status: Home / Self Care | Payer: Medicare Other | Source: Ambulatory Visit | Attending: Orthopedic Surgery | Admitting: Orthopedic Surgery

## 2023-02-21 ENCOUNTER — Other Ambulatory Visit: Payer: Self-pay

## 2023-02-21 ENCOUNTER — Encounter (HOSPITAL_COMMUNITY): Payer: Self-pay

## 2023-02-21 VITALS — BP 149/88 | HR 62 | Temp 98.0°F | Resp 16 | Ht 68.0 in | Wt 235.0 lb

## 2023-02-21 DIAGNOSIS — Z01818 Encounter for other preprocedural examination: Secondary | ICD-10-CM

## 2023-02-21 DIAGNOSIS — G8929 Other chronic pain: Secondary | ICD-10-CM | POA: Diagnosis not present

## 2023-02-21 DIAGNOSIS — M25562 Pain in left knee: Secondary | ICD-10-CM | POA: Insufficient documentation

## 2023-02-21 DIAGNOSIS — Z01812 Encounter for preprocedural laboratory examination: Secondary | ICD-10-CM | POA: Insufficient documentation

## 2023-02-21 HISTORY — DX: Family history of other specified conditions: Z84.89

## 2023-02-21 HISTORY — DX: Pneumonia, unspecified organism: J18.9

## 2023-02-21 HISTORY — DX: Personal history of urinary calculi: Z87.442

## 2023-02-21 HISTORY — DX: Cerebral infarction, unspecified: I63.9

## 2023-02-21 HISTORY — DX: Edema, unspecified: R60.9

## 2023-02-21 HISTORY — DX: Stress incontinence (female) (male): N39.3

## 2023-02-21 LAB — CBC WITH DIFFERENTIAL/PLATELET
Abs Immature Granulocytes: 0.02 10*3/uL (ref 0.00–0.07)
Basophils Absolute: 0.1 10*3/uL (ref 0.0–0.1)
Basophils Relative: 1 %
Eosinophils Absolute: 0.1 10*3/uL (ref 0.0–0.5)
Eosinophils Relative: 2 %
HCT: 43.3 % (ref 36.0–46.0)
Hemoglobin: 13.7 g/dL (ref 12.0–15.0)
Immature Granulocytes: 0 %
Lymphocytes Relative: 28 %
Lymphs Abs: 2.3 10*3/uL (ref 0.7–4.0)
MCH: 30 pg (ref 26.0–34.0)
MCHC: 31.6 g/dL (ref 30.0–36.0)
MCV: 94.7 fL (ref 80.0–100.0)
Monocytes Absolute: 0.8 10*3/uL (ref 0.1–1.0)
Monocytes Relative: 10 %
Neutro Abs: 4.8 10*3/uL (ref 1.7–7.7)
Neutrophils Relative %: 59 %
Platelets: 307 10*3/uL (ref 150–400)
RBC: 4.57 MIL/uL (ref 3.87–5.11)
RDW: 13.9 % (ref 11.5–15.5)
WBC: 8.2 10*3/uL (ref 4.0–10.5)
nRBC: 0 % (ref 0.0–0.2)

## 2023-02-21 LAB — COMPREHENSIVE METABOLIC PANEL
ALT: 13 U/L (ref 0–44)
AST: 17 U/L (ref 15–41)
Albumin: 3.7 g/dL (ref 3.5–5.0)
Alkaline Phosphatase: 78 U/L (ref 38–126)
Anion gap: 9 (ref 5–15)
BUN: 15 mg/dL (ref 8–23)
CO2: 23 mmol/L (ref 22–32)
Calcium: 8.9 mg/dL (ref 8.9–10.3)
Chloride: 107 mmol/L (ref 98–111)
Creatinine, Ser: 0.81 mg/dL (ref 0.44–1.00)
GFR, Estimated: >60 mL/min (ref >60)
Glucose, Bld: 85 mg/dL (ref 70–99)
Potassium: 3.7 mmol/L (ref 3.5–5.1)
Sodium: 139 mmol/L (ref 135–145)
Total Bilirubin: 0.7 mg/dL (ref 0.3–1.2)
Total Protein: 7.3 g/dL (ref 6.5–8.1)

## 2023-02-21 LAB — TYPE AND SCREEN
ABO/RH(D): O POS
Antibody Screen: NEGATIVE

## 2023-02-21 LAB — SURGICAL PCR SCREEN
MRSA, PCR: NEGATIVE
Staphylococcus aureus: NEGATIVE
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
## 2023-02-26 NOTE — Anesthesia Preprocedure Evaluation (Addendum)
Anesthesia Evaluation  Patient identified by MRN, date of birth, ID band Patient awake    Reviewed: Allergy & Precautions, NPO status , Patient's Chart, lab work & pertinent test results  Airway Mallampati: I  TM Distance: >3 FB Neck ROM: Full    Dental  (+) Edentulous Upper, Dental Advisory Given, Upper Dentures   Pulmonary neg pulmonary ROS, PE   Pulmonary exam normal breath sounds clear to auscultation       Cardiovascular hypertension, Pt. on medications +CHF  Normal cardiovascular exam Rhythm:Regular Rate:Normal  CV: Echo 10/06/20 1. Left ventricular ejection fraction, by estimation, is 70 to 75%. The  left ventricle has hyperdynamic function. The left ventricle has no  regional wall motion abnormalities. There is moderate concentric left  ventricular hypertrophy. Left ventricular  diastolic parameters are consistent with Grade I diastolic dysfunction  (impaired relaxation).   2. Right ventricular systolic function is normal. The right ventricular  size is normal. There is normal pulmonary artery systolic pressure. The  estimated right ventricular systolic pressure is 24.0 mmHg.   3. The mitral valve is normal in structure. Mild mitral valve  regurgitation.   4. The aortic valve is tricuspid. There is mild calcification of the  aortic valve. There is mild thickening of the aortic valve. Aortic valve  regurgitation is not visualized. Mild aortic valve sclerosis is present,  with no evidence of aortic valve  stenosis.   5. Aortic dilatation noted. There is mild dilatation of the aortic root,  measuring 38 mm.   6. The inferior vena cava is normal in size with greater than 50%  respiratory variability, suggesting right atrial pressure of 3 mmHg.    Stress Test 05/30/2018  Blood pressure demonstrated a normal response to exercise.   1. Good exercise tolerance.  2. No evidence for ischemia by ST segment analysis.      Neuro/Psych  Headaches PSYCHIATRIC DISORDERS Anxiety Depression    CVA negative neurological ROS  negative psych ROS   GI/Hepatic Neg liver ROS,GERD  ,,  Endo/Other  negative endocrine ROS    Renal/GU negative Renal ROS  negative genitourinary   Musculoskeletal negative musculoskeletal ROS (+) Arthritis ,    Abdominal   Peds  Hematology negative hematology ROS (+)   Anesthesia Other Findings   Reproductive/Obstetrics                             Anesthesia Physical Anesthesia Plan  ASA: 3  Anesthesia Plan: Spinal and Regional   Post-op Pain Management: Regional block* and Tylenol PO (pre-op)*   Induction:   PONV Risk Score and Plan: 2 and Treatment may vary due to age or medical condition, Propofol infusion, Dexamethasone and Ondansetron  Airway Management Planned: Natural Airway  Additional Equipment:   Intra-op Plan:   Post-operative Plan:   Informed Consent: I have reviewed the patients History and Physical, chart, labs and discussed the procedure including the risks, benefits and alternatives for the proposed anesthesia with the patient or authorized representative who has indicated his/her understanding and acceptance.     Dental advisory given  Plan Discussed with: CRNA  Anesthesia Plan Comments: (See PAT note 02/21/2023)       Anesthesia Quick Evaluation

## 2023-03-04 NOTE — Care Plan (Signed)
Ortho Bundle Case Management Note  Patient Details  Name: [name] MRN: [id number] Date of Birth: December 22, 1945   met with patient in the office for H&P. she will discharge to home with limited support. daughter is coming for three days and son is local but cannot take off work. she is planning to be driving by her first post op visit. rolling walker ordered for home use. HHPT referral sent to [facility] and OPPT set up with [facility]. discharge instructions discussed and questions answered.                  DME Arranged:  Dan Humphreys rolling DME Agency:  [employer]  HH Arranged:    HH Agency:     Additional Comments: Please contact me with any questions of if this plan should need to change.  Shauna Hugh,  RN,BSN,MHA,CCM  [facility]opaedic Specialist  [phone] 03/04/2023, 9:35 PM

## 2023-03-05 ENCOUNTER — Other Ambulatory Visit: Payer: Self-pay

## 2023-03-05 ENCOUNTER — Encounter (HOSPITAL_COMMUNITY): Payer: Self-pay | Admitting: Orthopedic Surgery

## 2023-03-05 ENCOUNTER — Encounter (HOSPITAL_COMMUNITY)
Admission: RE | Disposition: A | Discharge status: Home Health | Payer: Self-pay | Source: Ambulatory Visit | Attending: Orthopedic Surgery

## 2023-03-05 ENCOUNTER — Ambulatory Visit (HOSPITAL_COMMUNITY): Payer: Medicare Other | Admitting: Physician Assistant

## 2023-03-05 ENCOUNTER — Observation Stay (HOSPITAL_COMMUNITY): Payer: Medicare Other

## 2023-03-05 ENCOUNTER — Observation Stay (HOSPITAL_COMMUNITY)
Admission: RE | Admit: 2023-03-05 | Discharge: 2023-03-07 | Disposition: A | Discharge status: Home Health | Payer: Medicare Other | Source: Ambulatory Visit | Attending: Orthopedic Surgery | Admitting: Orthopedic Surgery

## 2023-03-05 ENCOUNTER — Ambulatory Visit (HOSPITAL_BASED_OUTPATIENT_CLINIC_OR_DEPARTMENT_OTHER): Payer: Medicare Other | Admitting: Anesthesiology

## 2023-03-05 DIAGNOSIS — G8918 Other acute postprocedural pain: Secondary | ICD-10-CM | POA: Diagnosis not present

## 2023-03-05 DIAGNOSIS — Z79899 Other long term (current) drug therapy: Secondary | ICD-10-CM | POA: Insufficient documentation

## 2023-03-05 DIAGNOSIS — I509 Heart failure, unspecified: Secondary | ICD-10-CM | POA: Diagnosis not present

## 2023-03-05 DIAGNOSIS — M1712 Unilateral primary osteoarthritis, left knee: Secondary | ICD-10-CM

## 2023-03-05 DIAGNOSIS — F418 Other specified anxiety disorders: Secondary | ICD-10-CM | POA: Diagnosis not present

## 2023-03-05 DIAGNOSIS — Z86711 Personal history of pulmonary embolism: Secondary | ICD-10-CM | POA: Diagnosis not present

## 2023-03-05 DIAGNOSIS — I110 Hypertensive heart disease with heart failure: Secondary | ICD-10-CM | POA: Diagnosis not present

## 2023-03-05 DIAGNOSIS — Z7982 Long term (current) use of aspirin: Secondary | ICD-10-CM | POA: Diagnosis not present

## 2023-03-05 DIAGNOSIS — Z91040 Latex allergy status: Secondary | ICD-10-CM | POA: Diagnosis not present

## 2023-03-05 DIAGNOSIS — Z8673 Personal history of transient ischemic attack (TIA), and cerebral infarction without residual deficits: Secondary | ICD-10-CM | POA: Insufficient documentation

## 2023-03-05 DIAGNOSIS — Z96652 Presence of left artificial knee joint: Secondary | ICD-10-CM | POA: Diagnosis not present

## 2023-03-05 DIAGNOSIS — Z471 Aftercare following joint replacement surgery: Secondary | ICD-10-CM | POA: Diagnosis not present

## 2023-03-05 HISTORY — PX: TOTAL KNEE ARTHROPLASTY: SHX125

## 2023-03-05 LAB — ABO/RH: ABO/RH(D): O POS

## 2023-03-05 SURGERY — ARTHROPLASTY, KNEE, TOTAL
Anesthesia: Regional | Site: Knee | Laterality: Left

## 2023-03-05 MED ORDER — CHLORHEXIDINE GLUCONATE 0.12% ORAL RINSE (MEDLINE KIT)
15.0000 mL | Freq: Once | OROMUCOSAL | Status: AC
  Administered 2023-03-05: 15 mL via OROMUCOSAL

## 2023-03-05 MED ORDER — DULOXETINE HCL 30 MG PO CPEP
30.0000 mg | ORAL_CAPSULE | Freq: Every day | ORAL | Status: DC
  Administered 2023-03-06 – 2023-03-07 (×2): 30 mg via ORAL
  Filled 2023-03-05 (×2): qty 1

## 2023-03-05 MED ORDER — DEXAMETHASONE SODIUM PHOSPHATE 10 MG/ML IJ SOLN
INTRAMUSCULAR | Status: DC | PRN
  Administered 2023-03-05: 10 mg

## 2023-03-05 MED ORDER — ACETAMINOPHEN 500 MG PO TABS: 1000.0000 mg | ORAL_TABLET | Freq: Three times a day (TID) | ORAL | 0 refills | Status: DC | PRN

## 2023-03-05 MED ORDER — SODIUM CHLORIDE (PF) 0.9 % IJ SOLN
INTRAMUSCULAR | Status: AC
  Filled 2023-03-05: qty 50

## 2023-03-05 MED ORDER — ONDANSETRON HCL 4 MG PO TABS: 4.0000 mg | ORAL_TABLET | Freq: Four times a day (QID) | ORAL | Status: DC | PRN

## 2023-03-05 MED ORDER — ACETAMINOPHEN 500 MG PO TABS
1000.0000 mg | ORAL_TABLET | Freq: Four times a day (QID) | ORAL | Status: AC
  Administered 2023-03-05 – 2023-03-06 (×4): 1000 mg via ORAL
  Filled 2023-03-05 (×4): qty 2

## 2023-03-05 MED ORDER — FUROSEMIDE 20 MG PO TABS
20.0000 mg | ORAL_TABLET | Freq: Every day | ORAL | Status: DC
  Administered 2023-03-06 – 2023-03-07 (×2): 20 mg via ORAL
  Filled 2023-03-05 (×2): qty 1

## 2023-03-05 MED ORDER — POLYETHYLENE GLYCOL 3350 17 G PO PACK: 17.0000 g | PACK | Freq: Every day | ORAL | Status: DC | PRN

## 2023-03-05 MED ORDER — FENTANYL CITRATE PF 50 MCG/ML IJ SOSY
100.0000 ug | PREFILLED_SYRINGE | INTRAMUSCULAR | Status: DC
  Administered 2023-03-05: 50 ug via INTRAVENOUS
  Filled 2023-03-05: qty 2

## 2023-03-05 MED ORDER — FENTANYL CITRATE PF 50 MCG/ML IJ SOSY: 25.0000 ug | PREFILLED_SYRINGE | INTRAMUSCULAR | Status: DC | PRN

## 2023-03-05 MED ORDER — ATORVASTATIN CALCIUM 20 MG PO TABS
20.0000 mg | ORAL_TABLET | Freq: Every day | ORAL | Status: DC
  Administered 2023-03-05 – 2023-03-06 (×2): 20 mg via ORAL
  Filled 2023-03-05 (×2): qty 1

## 2023-03-05 MED ORDER — PROPOFOL 10 MG/ML IV BOLUS
INTRAVENOUS | Status: AC
  Filled 2023-03-05: qty 20

## 2023-03-05 MED ORDER — ACETAMINOPHEN 325 MG PO TABS: 325.0000 mg | ORAL_TABLET | Freq: Four times a day (QID) | ORAL | Status: DC | PRN

## 2023-03-05 MED ORDER — AMLODIPINE BESYLATE 10 MG PO TABS
10.0000 mg | ORAL_TABLET | Freq: Every day | ORAL | Status: DC
  Administered 2023-03-06 – 2023-03-07 (×2): 10 mg via ORAL
  Filled 2023-03-05 (×2): qty 1

## 2023-03-05 MED ORDER — TOPIRAMATE 25 MG PO TABS: 50.0000 mg | ORAL_TABLET | Freq: Two times a day (BID) | ORAL | Status: DC

## 2023-03-05 MED ORDER — OXYCODONE HCL 5 MG PO TABS
ORAL_TABLET | ORAL | Status: AC
  Filled 2023-03-05: qty 1

## 2023-03-05 MED ORDER — KETOROLAC TROMETHAMINE 15 MG/ML IJ SOLN
7.5000 mg | Freq: Four times a day (QID) | INTRAMUSCULAR | Status: AC
  Administered 2023-03-05 – 2023-03-06 (×4): 7.5 mg via INTRAVENOUS
  Filled 2023-03-05 (×4): qty 1

## 2023-03-05 MED ORDER — GABAPENTIN 300 MG PO CAPS
600.0000 mg | ORAL_CAPSULE | Freq: Every day | ORAL | Status: DC
  Administered 2023-03-05 – 2023-03-06 (×2): 600 mg via ORAL
  Filled 2023-03-05 (×2): qty 2

## 2023-03-05 MED ORDER — MENTHOL 3 MG MT LOZG: 1.0000 | LOZENGE | OROMUCOSAL | Status: DC | PRN

## 2023-03-05 MED ORDER — MIDAZOLAM HCL 2 MG/2ML IJ SOLN
0.5000 mg | INTRAMUSCULAR | Status: DC
  Filled 2023-03-05: qty 2

## 2023-03-05 MED ORDER — PROPOFOL 500 MG/50ML IV EMUL
INTRAVENOUS | Status: DC | PRN
  Administered 2023-03-05: 50 ug/kg/min via INTRAVENOUS

## 2023-03-05 MED ORDER — PROPOFOL 10 MG/ML IV BOLUS
INTRAVENOUS | Status: DC | PRN
  Administered 2023-03-05: 20 mg via INTRAVENOUS
  Administered 2023-03-05: 30 mg via INTRAVENOUS

## 2023-03-05 MED ORDER — ISOPROPYL ALCOHOL 70 % SOLN
Status: DC | PRN
  Administered 2023-03-05: 1 via TOPICAL

## 2023-03-05 MED ORDER — TIZANIDINE HCL 4 MG PO TABS: 4.0000 mg | ORAL_TABLET | Freq: Three times a day (TID) | ORAL | 0 refills | Status: DC

## 2023-03-05 MED ORDER — SUMATRIPTAN SUCCINATE 50 MG PO TABS: 100.0000 mg | ORAL_TABLET | ORAL | Status: DC | PRN

## 2023-03-05 MED ORDER — LOSARTAN POTASSIUM 50 MG PO TABS
100.0000 mg | ORAL_TABLET | Freq: Every day | ORAL | Status: DC
  Administered 2023-03-06 – 2023-03-07 (×2): 100 mg via ORAL
  Filled 2023-03-05 (×2): qty 2

## 2023-03-05 MED ORDER — METHOCARBAMOL 500 MG IVPB - SIMPLE MED
INTRAVENOUS | Status: AC
  Filled 2023-03-05: qty 55

## 2023-03-05 MED ORDER — ONDANSETRON HCL 4 MG/2ML IJ SOLN
4.0000 mg | Freq: Four times a day (QID) | INTRAMUSCULAR | Status: DC | PRN
  Administered 2023-03-05 – 2023-03-06 (×2): 4 mg via INTRAVENOUS
  Filled 2023-03-05 (×2): qty 2

## 2023-03-05 MED ORDER — PANTOPRAZOLE SODIUM 40 MG PO TBEC
40.0000 mg | DELAYED_RELEASE_TABLET | Freq: Every day | ORAL | Status: DC
  Administered 2023-03-05 – 2023-03-07 (×3): 40 mg via ORAL
  Filled 2023-03-05 (×3): qty 1

## 2023-03-05 MED ORDER — METHOCARBAMOL 500 MG PO TABS: 500.0000 mg | ORAL_TABLET | Freq: Three times a day (TID) | ORAL | 0 refills | Status: AC | PRN

## 2023-03-05 MED ORDER — POLYETHYLENE GLYCOL 3350 17 G PO PACK: 17.0000 g | PACK | Freq: Every day | ORAL | 0 refills | Status: DC

## 2023-03-05 MED ORDER — CELECOXIB 100 MG PO CAPS: 100.0000 mg | ORAL_CAPSULE | Freq: Two times a day (BID) | ORAL | 0 refills | Status: AC

## 2023-03-05 MED ORDER — POVIDONE-IODINE 10 % EX SWAB: 2.0000 | Freq: Once | CUTANEOUS | Status: DC

## 2023-03-05 MED ORDER — PHENYLEPHRINE HCL-NACL 20-0.9 MG/250ML-% IV SOLN
INTRAVENOUS | Status: DC | PRN
  Administered 2023-03-05: 50 ug/min via INTRAVENOUS

## 2023-03-05 MED ORDER — OXYCODONE HCL 5 MG PO TABS: 5.0000 mg | ORAL_TABLET | ORAL | 0 refills | Status: DC | PRN

## 2023-03-05 MED ORDER — DEXAMETHASONE SODIUM PHOSPHATE 10 MG/ML IJ SOLN
INTRAMUSCULAR | Status: AC
  Filled 2023-03-05: qty 1

## 2023-03-05 MED ORDER — LACTATED RINGERS IV SOLN: INTRAVENOUS | Status: DC

## 2023-03-05 MED ORDER — ONDANSETRON HCL 4 MG PO TABS: 4.0000 mg | ORAL_TABLET | Freq: Three times a day (TID) | ORAL | 0 refills | Status: AC | PRN

## 2023-03-05 MED ORDER — SODIUM CHLORIDE 0.9 % IR SOLN
Status: DC | PRN
  Administered 2023-03-05: 3000 mL

## 2023-03-05 MED ORDER — ACETAMINOPHEN 500 MG PO TABS
1000.0000 mg | ORAL_TABLET | Freq: Once | ORAL | Status: AC
  Administered 2023-03-05: 1000 mg via ORAL
  Filled 2023-03-05: qty 2

## 2023-03-05 MED ORDER — 0.9 % SODIUM CHLORIDE (POUR BTL) OPTIME
TOPICAL | Status: DC | PRN
  Administered 2023-03-05: 1000 mL

## 2023-03-05 MED ORDER — DOCUSATE SODIUM 100 MG PO CAPS
100.0000 mg | ORAL_CAPSULE | Freq: Two times a day (BID) | ORAL | Status: DC
  Administered 2023-03-06 – 2023-03-07 (×3): 100 mg via ORAL
  Filled 2023-03-05 (×4): qty 1

## 2023-03-05 MED ORDER — ONDANSETRON HCL 4 MG/2ML IJ SOLN
INTRAMUSCULAR | Status: AC
  Filled 2023-03-05: qty 2

## 2023-03-05 MED ORDER — DEXAMETHASONE SODIUM PHOSPHATE 10 MG/ML IJ SOLN
INTRAMUSCULAR | Status: DC | PRN
  Administered 2023-03-05: 10 mg via INTRAVENOUS

## 2023-03-05 MED ORDER — SODIUM CHLORIDE 0.9 % IV SOLN: INTRAVENOUS | Status: DC

## 2023-03-05 MED ORDER — BUPIVACAINE HCL 0.25 % IJ SOLN
INTRAMUSCULAR | Status: AC
  Filled 2023-03-05: qty 1

## 2023-03-05 MED ORDER — TOPIRAMATE 25 MG PO TABS
50.0000 mg | ORAL_TABLET | Freq: Every day | ORAL | Status: DC
  Administered 2023-03-06 – 2023-03-07 (×2): 50 mg via ORAL
  Filled 2023-03-05 (×2): qty 2

## 2023-03-05 MED ORDER — CEFAZOLIN SODIUM-DEXTROSE 2-4 GM/100ML-% IV SOLN
2.0000 g | Freq: Four times a day (QID) | INTRAVENOUS | Status: AC
  Administered 2023-03-05 (×2): 2 g via INTRAVENOUS
  Filled 2023-03-05 (×2): qty 100

## 2023-03-05 MED ORDER — OXYCODONE HCL 5 MG PO TABS
5.0000 mg | ORAL_TABLET | ORAL | Status: DC | PRN
  Administered 2023-03-05 (×2): 5 mg via ORAL
  Administered 2023-03-05: 10 mg via ORAL
  Administered 2023-03-06: 5 mg via ORAL
  Administered 2023-03-06 (×2): 10 mg via ORAL
  Administered 2023-03-06: 5 mg via ORAL
  Filled 2023-03-05: qty 1
  Filled 2023-03-05: qty 2
  Filled 2023-03-05 (×2): qty 1
  Filled 2023-03-05 (×2): qty 2

## 2023-03-05 MED ORDER — CEFAZOLIN SODIUM-DEXTROSE 2-4 GM/100ML-% IV SOLN
2.0000 g | INTRAVENOUS | Status: AC
  Administered 2023-03-05: 2 g via INTRAVENOUS
  Filled 2023-03-05: qty 100

## 2023-03-05 MED ORDER — MELATONIN 5 MG PO TABS
10.0000 mg | ORAL_TABLET | Freq: Every day | ORAL | Status: DC
  Administered 2023-03-05 – 2023-03-06 (×2): 10 mg via ORAL
  Filled 2023-03-05 (×2): qty 2

## 2023-03-05 MED ORDER — TOPIRAMATE 100 MG PO TABS
100.0000 mg | ORAL_TABLET | Freq: Every day | ORAL | Status: DC
  Administered 2023-03-05 – 2023-03-06 (×2): 100 mg via ORAL
  Filled 2023-03-05 (×2): qty 1

## 2023-03-05 MED ORDER — PROPOFOL 1000 MG/100ML IV EMUL
INTRAVENOUS | Status: AC
  Filled 2023-03-05: qty 100

## 2023-03-05 MED ORDER — APIXABAN 2.5 MG PO TABS: 2.5000 mg | ORAL_TABLET | Freq: Two times a day (BID) | ORAL | 0 refills | Status: DC

## 2023-03-05 MED ORDER — HYDROMORPHONE HCL 1 MG/ML IJ SOLN
0.5000 mg | INTRAMUSCULAR | Status: DC | PRN
  Administered 2023-03-05: 0.5 mg via INTRAVENOUS
  Filled 2023-03-05: qty 1

## 2023-03-05 MED ORDER — BUPIVACAINE-EPINEPHRINE 0.25% -1:200000 IJ SOLN
INTRAMUSCULAR | Status: DC | PRN
  Administered 2023-03-05: 30 mL

## 2023-03-05 MED ORDER — GABAPENTIN 300 MG PO CAPS: 300.0000 mg | ORAL_CAPSULE | Freq: Three times a day (TID) | ORAL | Status: DC

## 2023-03-05 MED ORDER — WATER FOR IRRIGATION, STERILE IR SOLN
Status: DC | PRN
  Administered 2023-03-05: 2000 mL

## 2023-03-05 MED ORDER — ACETAMINOPHEN 500 MG PO TABS: 1000.0000 mg | ORAL_TABLET | Freq: Once | ORAL | Status: DC

## 2023-03-05 MED ORDER — METHOCARBAMOL 500 MG IVPB - SIMPLE MED
500.0000 mg | Freq: Four times a day (QID) | INTRAVENOUS | Status: DC | PRN
  Administered 2023-03-05: 500 mg via INTRAVENOUS

## 2023-03-05 MED ORDER — TRANEXAMIC ACID-NACL 1000-0.7 MG/100ML-% IV SOLN
1000.0000 mg | INTRAVENOUS | Status: AC
  Administered 2023-03-05: 1000 mg via INTRAVENOUS
  Filled 2023-03-05: qty 100

## 2023-03-05 MED ORDER — APIXABAN 2.5 MG PO TABS
2.5000 mg | ORAL_TABLET | Freq: Two times a day (BID) | ORAL | Status: DC
  Administered 2023-03-06 – 2023-03-07 (×3): 2.5 mg via ORAL
  Filled 2023-03-05 (×3): qty 1

## 2023-03-05 MED ORDER — BUPIVACAINE LIPOSOME 1.3 % IJ SUSP
INTRAMUSCULAR | Status: DC | PRN
  Administered 2023-03-05: 20 mL

## 2023-03-05 MED ORDER — SODIUM CHLORIDE 0.9% FLUSH
INTRAVENOUS | Status: DC | PRN
  Administered 2023-03-05: 30 mL

## 2023-03-05 MED ORDER — DIPHENHYDRAMINE HCL 12.5 MG/5ML PO ELIX
12.5000 mg | ORAL_SOLUTION | ORAL | Status: DC | PRN
  Administered 2023-03-06 (×2): 25 mg via ORAL
  Filled 2023-03-05 (×2): qty 10

## 2023-03-05 MED ORDER — BUPIVACAINE IN DEXTROSE 0.75-8.25 % IT SOLN
INTRATHECAL | Status: DC | PRN
  Administered 2023-03-05: 2 mL via INTRATHECAL

## 2023-03-05 MED ORDER — BUPIVACAINE LIPOSOME 1.3 % IJ SUSP
INTRAMUSCULAR | Status: AC
  Filled 2023-03-05: qty 20

## 2023-03-05 MED ORDER — ROPIVACAINE HCL 5 MG/ML IJ SOLN
INTRAMUSCULAR | Status: DC | PRN
  Administered 2023-03-05: 20 mL via PERINEURAL

## 2023-03-05 MED ORDER — GABAPENTIN 300 MG PO CAPS
300.0000 mg | ORAL_CAPSULE | Freq: Every day | ORAL | Status: DC
  Administered 2023-03-06 – 2023-03-07 (×2): 300 mg via ORAL
  Filled 2023-03-05 (×2): qty 1

## 2023-03-05 MED ORDER — BUPIVACAINE LIPOSOME 1.3 % IJ SUSP: 20.0000 mL | Freq: Once | INTRAMUSCULAR | Status: DC

## 2023-03-05 MED ORDER — METHOCARBAMOL 500 MG PO TABS
500.0000 mg | ORAL_TABLET | Freq: Four times a day (QID) | ORAL | Status: DC | PRN
  Administered 2023-03-06 – 2023-03-07 (×5): 500 mg via ORAL
  Filled 2023-03-05 (×5): qty 1

## 2023-03-05 MED ORDER — PHENOL 1.4 % MT LIQD: 1.0000 | OROMUCOSAL | Status: DC | PRN

## 2023-03-05 MED ORDER — EPINEPHRINE PF 1 MG/ML IJ SOLN
INTRAMUSCULAR | Status: AC
  Filled 2023-03-05: qty 1

## 2023-03-05 MED ORDER — DEXAMETHASONE SODIUM PHOSPHATE 10 MG/ML IJ SOLN: 8.0000 mg | Freq: Once | INTRAMUSCULAR | Status: DC

## 2023-03-05 SURGICAL SUPPLY — 65 items
ADH SKN CLS APL DERMABOND .7 (GAUZE/BANDAGES/DRESSINGS) ×1
APL PRP STRL LF DISP 70% ISPRP (MISCELLANEOUS) ×2
BAG COUNTER SPONGE SURGICOUNT (BAG) IMPLANT
BAG SPNG CNTER NS LX DISP (BAG)
BLADE SAG 18X100X1.27 (BLADE) ×1 IMPLANT
BLADE SAW SAG 35X64 .89 (BLADE) ×1 IMPLANT
BNDG CMPR 5X3 CHSV STRCH STRL (GAUZE/BANDAGES/DRESSINGS) ×1
BNDG CMPR MED 10X6 ELC LF (GAUZE/BANDAGES/DRESSINGS) ×1
BNDG COHESIVE 3X5 TAN ST LF (GAUZE/BANDAGES/DRESSINGS) ×1 IMPLANT
BNDG ELASTIC 6X10 VLCR STRL LF (GAUZE/BANDAGES/DRESSINGS) ×1 IMPLANT
BOWL SMART MIX CTS (DISPOSABLE) ×1 IMPLANT
BSPLAT TIB 5D E CMNT STM LT (Knees) ×1 IMPLANT
CEMENT BONE R 1X40 (Cement) IMPLANT
CEMENT BONE REFOBACIN R1X40 US (Cement) IMPLANT
CHLORAPREP W/TINT 26 (MISCELLANEOUS) ×2 IMPLANT
CLSR STERI-STRIP ANTIMIC 1/2X4 (GAUZE/BANDAGES/DRESSINGS) IMPLANT
COMP FEM CEMT PERSONA STD SZ7 (Knees) ×1 IMPLANT
COMPONENT FEM CMT PRSN STD SZ7 (Knees) IMPLANT
COVER SURGICAL LIGHT HANDLE (MISCELLANEOUS) ×1 IMPLANT
CUFF TOURN SGL QUICK 34 (TOURNIQUET CUFF) ×1
CUFF TRNQT CYL 34X4.125X (TOURNIQUET CUFF) ×1 IMPLANT
DERMABOND ADVANCED .7 DNX12 (GAUZE/BANDAGES/DRESSINGS) ×1 IMPLANT
DRAPE INCISE IOBAN 85X60 (DRAPES) ×1 IMPLANT
DRAPE SHEET LG 3/4 BI-LAMINATE (DRAPES) ×1 IMPLANT
DRAPE U-SHAPE 47X51 STRL (DRAPES) ×1 IMPLANT
DRESSING AQUACEL AG SP 3.5X10 (GAUZE/BANDAGES/DRESSINGS) ×1 IMPLANT
DRSG AQUACEL AG SP 3.5X10 (GAUZE/BANDAGES/DRESSINGS) ×1
ELECT REM PT RETURN 15FT ADLT (MISCELLANEOUS) ×1 IMPLANT
GAUZE SPONGE 4X4 12PLY STRL (GAUZE/BANDAGES/DRESSINGS) ×1 IMPLANT
GLOVE BIO SURGEON STRL SZ 6.5 (GLOVE) ×2 IMPLANT
GLOVE BIOGEL PI IND STRL 6.5 (GLOVE) ×1 IMPLANT
GLOVE BIOGEL PI IND STRL 8 (GLOVE) ×1 IMPLANT
GLOVE SURG ORTHO 8.0 STRL STRW (GLOVE) ×2 IMPLANT
GOWN STRL REUS W/ TWL XL LVL3 (GOWN DISPOSABLE) ×2 IMPLANT
GOWN STRL REUS W/TWL XL LVL3 (GOWN DISPOSABLE) ×2
HANDPIECE INTERPULSE COAX TIP (DISPOSABLE) ×1
HOLDER FOLEY CATH W/STRAP (MISCELLANEOUS) ×1 IMPLANT
HOOD PEEL AWAY T7 (MISCELLANEOUS) ×3 IMPLANT
KIT TURNOVER KIT A (KITS) IMPLANT
MANIFOLD NEPTUNE II (INSTRUMENTS) ×1 IMPLANT
MARKER SKIN DUAL TIP RULER LAB (MISCELLANEOUS) ×1 IMPLANT
NS IRRIG 1000ML POUR BTL (IV SOLUTION) ×1 IMPLANT
PACK TOTAL KNEE CUSTOM (KITS) ×1 IMPLANT
PIN DRILL HDLS TROCAR 75 4PK (PIN) IMPLANT
SCREW HEADED 33MM KNEE (MISCELLANEOUS) IMPLANT
SET HNDPC FAN SPRY TIP SCT (DISPOSABLE) ×1 IMPLANT
SOLUTION IRRIG SURGIPHOR (IV SOLUTION) IMPLANT
SPIKE FLUID TRANSFER (MISCELLANEOUS) ×1 IMPLANT
STEM POLY PAT PLY 32M KNEE (Knees) IMPLANT
STEM TIBIA 5 DEG SZ E L KNEE (Knees) IMPLANT
STEM TIBIAL SZ6-7 E-F10 (Stem) IMPLANT
STRIP CLOSURE SKIN 1/2X4 (GAUZE/BANDAGES/DRESSINGS) ×1 IMPLANT
SUT MNCRL AB 3-0 PS2 18 (SUTURE) ×1 IMPLANT
SUT STRATAFIX 0 PDS 27 VIOLET (SUTURE) ×1
SUT STRATAFIX PDO 1 14 VIOLET (SUTURE) ×1
SUT STRATFX PDO 1 14 VIOLET (SUTURE) ×1
SUT VIC AB 2-0 CT2 27 (SUTURE) ×2 IMPLANT
SUTURE STRATFX 0 PDS 27 VIOLET (SUTURE) ×1 IMPLANT
SUTURE STRATFX PDO 1 14 VIOLET (SUTURE) ×1 IMPLANT
SYR 50ML LL SCALE MARK (SYRINGE) ×1 IMPLANT
TIBIA STEM 5 DEG SZ E L KNEE (Knees) ×1 IMPLANT
TRAY FOLEY MTR SLVR 14FR STAT (SET/KITS/TRAYS/PACK) IMPLANT
TUBE SUCTION HIGH CAP CLEAR NV (SUCTIONS) ×1 IMPLANT
UNDERPAD 30X36 HEAVY ABSORB (UNDERPADS AND DIAPERS) ×1 IMPLANT
WRAP KNEE MAXI GEL POST OP (GAUZE/BANDAGES/DRESSINGS) IMPLANT

## 2023-03-05 NOTE — Anesthesia Procedure Notes (Signed)
Spinal  Patient location during procedure: OR Start time: 03/05/2023 10:19 AM End time: 03/05/2023 10:23 AM Reason for block: surgical anesthesia Staffing Performed: resident/CRNA  Resident/CRNA: Florene Route, CRNA Performed by: Florene Route, CRNA Authorized by: Elmer Picker, MD   Preanesthetic Checklist Completed: patient identified, IV checked, site marked, risks and benefits discussed, surgical consent, monitors and equipment checked, pre-op evaluation and timeout performed Spinal Block Patient position: sitting Prep: DuraPrep Patient monitoring: heart rate, continuous pulse ox and blood pressure Approach: midline Location: L3-4 Injection technique: single-shot Needle Needle type: Pencan  Needle gauge: 24 G Needle length: 10 cm Assessment Sensory level: T6 Events: CSF return Additional Notes Kit expiration date 01/22/2025 and lot# [id number] Clear free flow CSF, negative heme, negative paresthesia Tolerated well and returned to supine position

## 2023-03-05 NOTE — Anesthesia Procedure Notes (Signed)
Anesthesia Regional Block: Adductor canal block   Pre-Anesthetic Checklist: , timeout performed,  Correct Patient, Correct Site, Correct Laterality,  Correct Procedure, Correct Position, site marked,  Risks and benefits discussed,  Pre-op evaluation,  At surgeon's request and post-op pain management  Laterality: Left  Prep: Maximum Sterile Barrier Precautions used, chloraprep       Needles:  Injection technique: Single-shot  Needle Type: Echogenic Stimulator Needle     Needle Length: 9cm  Needle Gauge: 21     Additional Needles:   Procedures:,,,, ultrasound used (permanent image in chart),,    Narrative:  Start time: 03/05/2023 9:30 AM End time: 03/05/2023 9:34 AM Injection made incrementally with aspirations every 5 mL. Anesthesiologist: Elmer Picker, MD

## 2023-03-05 NOTE — Anesthesia Procedure Notes (Signed)
Date/Time: 03/05/2023 10:17 AM  Performed by: Florene Route, CRNAOxygen Delivery Method: Simple face mask

## 2023-03-05 NOTE — Plan of Care (Signed)
  Problem: Activity: Goal: Ability to avoid complications of mobility impairment will improve Outcome: Progressing Goal: Range of joint motion will improve Outcome: Progressing   Problem: Pain Management: Goal: Pain level will decrease with appropriate interventions Outcome: Progressing   Problem: Safety: Goal: Ability to remain free from injury will improve Outcome: Progressing   

## 2023-03-05 NOTE — Anesthesia Postprocedure Evaluation (Signed)
Anesthesia Post Note  Patient: [name]  Procedure(s) Performed: TOTAL KNEE ARTHROPLASTY (Left: Knee)     Patient location during evaluation: PACU Anesthesia Type: Regional and Spinal Level of consciousness: oriented and awake and alert Pain management: pain level controlled Vital Signs Assessment: post-procedure vital signs reviewed and stable Respiratory status: spontaneous breathing, respiratory function stable and patient connected to nasal cannula oxygen Cardiovascular status: blood pressure returned to baseline and stable Postop Assessment: no headache, no backache and no apparent nausea or vomiting Anesthetic complications: no  No notable events documented.  Last Vitals:  Vitals:   03/05/23 1330 03/05/23 1345  BP: (!) 158/84 (!) 173/81  Pulse: 75 78  Resp: 19 15  Temp: (!) 36.4 C   SpO2: 97% 100%    Last Pain:  Vitals:   03/05/23 1350  TempSrc:   PainSc: 4                  Dnya Hickle L Kellie Chisolm

## 2023-03-05 NOTE — Op Note (Addendum)
DATE OF SURGERY:  03/05/2023 TIME: 12:27 PM  PATIENT NAME:  [name]   AGE: 77 y.o.    PRE-OPERATIVE DIAGNOSIS:  End-stage left knee osteoarthritis  POST-OPERATIVE DIAGNOSIS:  Same  PROCEDURE:  Left Total Knee Arthroplasty  SURGEON:  Jazalynn Mireles A Kishia Shackett, MD   ASSISTANT: Kathie Dike, PA-C, present and scrubbed throughout the case, critical for assistance with exposure, retraction, instrumentation, and closure.   OPERATIVE IMPLANTS:  Cemented Zimmer persona size 7 standard femur, E tibial baseplate, 32 mm patella all poly, 10 mm MC poly insert Implant Name Type Inv. Item Serial No. Manufacturer Lot No. LRB No. Used Action  CEMENT BONE R 1X40 - [id number] Cement CEMENT BONE R 1X40  [employer]IMMER [employer](ORTH,TRAU,BIO,SG) [id number] Left 1 Implanted  CEMENT BONE R 1X40 - [id number] Cement CEMENT BONE R 1X40  [employer]IMMER [employer]ON(ORTH,TRAU,BIO,SG) [id number] Left 1 Implanted  STEM POLY PAT PLY 58M KNEE - [id number] Knees STEM POLY PAT PLY 58M KNEE  ZIMMER [employer]ON(ORTH,TRAU,BIO,SG) [id number] Left 1 Implanted  COMP FEM CEMT PERSONA STD SZ7 - [id number] Knees COMP FEM CEMT PERSONA STD SZ7  [employer]IMMER [employer]ON(ORTH,TRAU,BIO,SG) [id number] Left 1 Implanted  TIBIA STEM 5 DEG SZ E L KNEE - [id number] Knees TIBIA STEM 5 DEG SZ E L KNEE  ZIMMER [employer]ON(ORTH,TRAU,BIO,SG) [id number] Left 1 Implanted  STEM TIBIAL SZ6-7 E-F10 - [id number] Stem STEM TIBIAL SZ6-7 E-F10  [employer]IMMER [employer]ON(ORTH,TRAU,BIO,SG) [id number] Left 1 Implanted      PREOPERATIVE INDICATIONS:  [name] is a 77 y.o. year old female with end stage bone on bone degenerative arthritis of the knee who failed conservative treatment, including injections, antiinflammatories, activity modification, and assistive devices, and had significant impairment of their activities of daily living, and elected for Total Knee Arthroplasty.   The risks, benefits, and alternatives were discussed at length including but not limited to the risks of infection, bleeding, nerve injury,  stiffness, blood clots, the need for revision surgery, cardiopulmonary complications, among others, and they were willing to proceed.  ESTIMATED BLOOD LOSS: 25cc  OPERATIVE DESCRIPTION:   Once adequate anesthesia was induced, preoperative antibiotics, 2 gm of ancef,1 gm of Tranexamic Acid, and 8 mg of Decadron administered, the patient was positioned supine with a left thigh tourniquet placed.  The left lower extremity was prepped and draped in sterile fashion.  A time-  out was performed identifying the patient, planned procedure, and the appropriate extremity.     The leg was  exsanguinated, tourniquet elevated to 250 mmHg.  A midline incision was  made followed by median parapatellar arthrotomy. Anterior horn of the medial meniscus was released and resected. A medial release was performed, the infrapatellar fat pad was resected with care taken to protect the patellar tendon. The suprapatellar fat was removed to exposed the distal anterior femur. The anterior horn of the lateral meniscus and ACL were released.    Following initial  exposure, I first started with the femur  The femoral  canal was opened with a drill, canal was suctioned to try to prevent fat emboli.  An  intramedullary rod was passed set at 5 degrees valgus, 10 mm. The distal femur was resected.  Following this resection, the tibia was  subluxated anteriorly.  Using the extramedullary guide, 10 mm of bone was resected off   the proximal lateral tibia.  Following the resection the knee was tight in both flexion extension elected to resect additional 2 mm of both the distal femur and proximal tibia which markedly improved the extension space.  We confirmed the gap would  be  stable medially and laterally with a size 10mm spacer block as well as confirmed that the tibial cut was perpendicular in the coronal plane, checking with an alignment rod.    Once this was done, the posterior femoral referencing femoral sizer was placed under to the  posterior condyles with 3 degrees of external rotational which was parallel to the transepicondylar axis and perpendicular to [employer]. The femur was sized to be a size 7 in the anterior-  posterior dimension. The  anterior, posterior, and  chamfer cuts were made without difficulty nor   notching making certain that I was along the anterior cortex to help  with flexion gap stability. Next a laminar spreader was placed with the knee in flexion and the medial lateral menisci were resected.  5 cc of the Exparel mixture was injected in the medial side of the back of the knee and 3 cc in the lateral side.  1/2 inch curved osteotome was used to resect posterior osteophyte that was then removed with a pituitary rongeur.       At this point, the tibia was sized to be a size E.  The size E tray was  then pinned in position. Trial reduction was now carried with a 7 femur, E tibia, a 10 mm MC insert.  The knee had full extension and was stable to varus valgus stress in extension.  The knee was slightly tight in flexion and the PCL was partially released.   Attention was next directed to the patella.  Precut  measurement was noted to be 24 mm.  I resected down to 13 mm and used a  32mm patellar button to restore patellar height as well as cover the cut surface.     The patella lug holes were drilled and a 32 mm patella poly trial was placed.    The knee was brought to full extension with good flexion stability with the patella tracking through the trochlea without application of pressure.     Next the femoral component was again assessed and determined to be seated and appropriately lateralized.  The femoral lug holes were drilled.  The femoral component was then removed. Tibial component was again assessed and felt to be seated and appropriately rotated with the medial third of the tubercle. The tibia was then drilled, and keel punched.     Final components were  opened and cement was mixed.      Final  implants were then  cemented onto cleaned and dried cut surfaces of bone with the knee brought to extension with a 10 mm MC poly.  The knee was irrigated with Irrisept irrigation (given iodine allergy).  The synovial lining was  then injected a dilute Exparel with 0.25% marcaine with epi.      Once the cement had fully cured, excess cement was removed throughout the knee.  I confirmed that I was satisfied with the range of motion and stability, and the final 10 mm MC poly insert was chosen.  It was placed into the knee.         The tourniquet had been let down.  No significant hemostasis was required.  The medial parapatellar arthrotomy was then reapproximated using #1 Stratafix sutures with the knee  in flexion.  The remaining wound was closed with 0 stratafix, 2-0 Vicryl, and running 3-0 Monocryl. The knee was cleaned, dried, dressed sterilely using Dermabond and   Aquacel dressing.  The patient was then brought to recovery room  in stable condition, tolerating the procedure  well. There were no complications.   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 Blanchie Dessert at [facility].  Address: [address], [place], [place] [address]  Office Phone: [phone]  Weber Cooks, MD Orthopaedic Surgery

## 2023-03-05 NOTE — Plan of Care (Signed)
  Problem: Education: Goal: Knowledge of the prescribed therapeutic regimen will improve Outcome: Progressing   Problem: Activity: Goal: Range of joint motion will improve Outcome: Progressing   Problem: Pain Management: Goal: Pain level will decrease with appropriate interventions Outcome: Progressing   Problem: Coping: Goal: Level of anxiety will decrease Outcome: Progressing   Problem: Safety: Goal: Ability to remain free from injury will improve Outcome: Progressing   

## 2023-03-05 NOTE — Interval H&P Note (Signed)

## 2023-03-05 NOTE — Discharge Instructions (Addendum)
INSTRUCTIONS AFTER JOINT REPLACEMENT   Remove items at home which could result in a fall. This includes throw rugs or furniture in walking pathways ICE to the affected joint every three hours while awake for 30 minutes at a time, for at least the first 3-5 days, and then as needed for pain and swelling.  Continue to use ice for pain and swelling. You may notice swelling that will progress down to the foot and ankle.  This is normal after surgery.  Elevate your leg when you are not up walking on it.   Continue to use the breathing machine you got in the hospital (incentive spirometer) which will help keep your temperature down.  It is common for your temperature to cycle up and down following surgery, especially at night when you are not up moving around and exerting yourself.  The breathing machine keeps your lungs expanded and your temperature down.   DIET:  As you were doing prior to hospitalization, we recommend a well-balanced diet.  DRESSING / WOUND CARE / SHOWERING  Keep the surgical dressing until follow up.  The dressing is water proof, so you can shower without any extra covering.  IF THE DRESSING FALLS OFF or the wound gets wet inside, change the dressing with sterile gauze.  Please use good hand washing techniques before changing the dressing.  Do not use any lotions or creams on the incision until instructed by your surgeon.    ACTIVITY  Increase activity slowly as tolerated, but follow the weight bearing instructions below.   No driving for 6 weeks or until further direction given by your physician.  You cannot drive while taking narcotics.  No lifting or carrying greater than 10 lbs. until further directed by your surgeon. Avoid periods of inactivity such as sitting longer than an hour when not asleep. This helps prevent blood clots.  You may return to work once you are authorized by your doctor.     WEIGHT BEARING   Weight bearing as tolerated with assist device (walker, cane,  etc) as directed, use it as long as suggested by your surgeon or therapist, typically at least 4-6 weeks.   EXERCISES  Results after joint replacement surgery are often greatly improved when you follow the exercise, range of motion and muscle strengthening exercises prescribed by your doctor. Safety measures are also important to protect the joint from further injury. Any time any of these exercises cause you to have increased pain or swelling, decrease what you are doing until you are comfortable again and then slowly increase them. If you have problems or questions, call your caregiver or physical therapist for advice.   Rehabilitation is important following a joint replacement. After just a few days of immobilization, the muscles of the leg can become weakened and shrink (atrophy).  These exercises are designed to build up the tone and strength of the thigh and leg muscles and to improve motion. Often times heat used for twenty to thirty minutes before working out will loosen up your tissues and help with improving the range of motion but do not use heat for the first two weeks following surgery (sometimes heat can increase post-operative swelling).   These exercises can be done on a training (exercise) mat, on the floor, on a table or on a bed. Use whatever works the best and is most comfortable for you.    Use music or television while you are exercising so that the exercises are a pleasant break in your  day. This will make your life better with the exercises acting as a break in your routine that you can look forward to.   Perform all exercises about fifteen times, three times per day or as directed.  You should exercise both the operative leg and the other leg as well.  Exercises include:   Quad Sets - Tighten up the muscle on the front of the thigh (Quad) and hold for 5-10 seconds.   Straight Leg Raises - With your knee straight (if you were given a brace, keep it on), lift the leg to 60  degrees, hold for 3 seconds, and slowly lower the leg.  Perform this exercise against resistance later as your leg gets stronger.  Leg Slides: Lying on your back, slowly slide your foot toward your buttocks, bending your knee up off the floor (only go as far as is comfortable). Then slowly slide your foot back down until your leg is flat on the floor again.  Angel Wings: Lying on your back spread your legs to the side as far apart as you can without causing discomfort.  Hamstring Strength:  Lying on your back, push your heel against the floor with your leg straight by tightening up the muscles of your buttocks.  Repeat, but this time bend your knee to a comfortable angle, and push your heel against the floor.  You may put a pillow under the heel to make it more comfortable if necessary.   A rehabilitation program following joint replacement surgery can speed recovery and prevent re-injury in the future due to weakened muscles. Contact your doctor or a physical therapist for more information on knee rehabilitation.    CONSTIPATION  Constipation is defined medically as fewer than three stools per week and severe constipation as less than one stool per week.  Even if you have a regular bowel pattern at home, your normal regimen is likely to be disrupted due to multiple reasons following surgery.  Combination of anesthesia, postoperative narcotics, change in appetite and fluid intake all can affect your bowels.   YOU MUST use at least one of the following options; they are listed in order of increasing strength to get the job done.  They are all available over the counter, and you may need to use some, POSSIBLY even all of these options:    Drink plenty of fluids (prune juice may be helpful) and high fiber foods Colace 100 mg by mouth twice a day  Senokot for constipation as directed and as needed Dulcolax (bisacodyl), take with full glass of water  Miralax (polyethylene glycol) once or twice a day as  needed.  If you have tried all these things and are unable to have a bowel movement in the first 3-4 days after surgery call either your surgeon or your primary doctor.    If you experience loose stools or diarrhea, hold the medications until you stool forms back up.  If your symptoms do not get better within 1 week or if they get worse, check with your doctor.  If you experience "the worst abdominal pain ever" or develop nausea or vomiting, please contact the office immediately for further recommendations for treatment.   ITCHING:  If you experience itching with your medications, try taking only a single pain pill, or even half a pain pill at a time.  You can also use Benadryl over the counter for itching or also to help with sleep.   TED HOSE STOCKINGS:  Use stockings on both   legs until for at least 2 weeks or as directed by physician office. They may be removed at night for sleeping.  MEDICATIONS:  See your medication summary on the "After Visit Summary" that nursing will review with you.  You may have some home medications which will be placed on hold until you complete the course of blood thinner medication.  It is important for you to complete the blood thinner medication as prescribed.  Blood clot prevention (DVT Prophylaxis): After surgery you are at an increased risk for a blood clot. you were prescribed a blood thinner, Eliquis, to be taken daily for a total of 4 weeks from surgery to help reduce your risk of getting a blood clot. This will help prevent a blood clot. Signs of a pulmonary embolus (blood clot in the lungs) include sudden short of breath, feeling lightheaded or dizzy, chest pain with a deep breath, rapid pulse rapid breathing. Signs of a blood clot in your arms or legs include new unexplained swelling and cramping, warm, red or darkened skin around the painful area. Please call the office or 911 right away if these signs or symptoms develop.  Information on my medicine -  ELIQUIS (apixaban)  Why was Eliquis prescribed for you? Eliquis was prescribed for you to reduce the risk of blood clots forming after orthopedic surgery.    What do You need to know about Eliquis? Take your Eliquis TWICE DAILY - one tablet in the morning and one tablet in the evening with or without food.  It would be best to take the dose about the same time each day.  If you have difficulty swallowing the tablet whole please discuss with your pharmacist how to take the medication safely.  Take Eliquis exactly as prescribed by your doctor and DO NOT stop taking Eliquis without talking to the doctor who prescribed the medication.  Stopping without other medication to take the place of Eliquis may increase your risk of developing a clot.  After discharge, you should have regular check-up appointments with your healthcare provider that is prescribing your Eliquis.  What do you do if you miss a dose? If a dose of ELIQUIS is not taken at the scheduled time, take it as soon as possible on the same day and twice-daily administration should be resumed.  The dose should not be doubled to make up for a missed dose.  Do not take more than one tablet of ELIQUIS at the same time.  Important Safety Information A possible side effect of Eliquis is bleeding. You should call your healthcare provider right away if you experience any of the following: Bleeding from an injury or your nose that does not stop. Unusual colored urine (red or dark brown) or unusual colored stools (red or black). Unusual bruising for unknown reasons. A serious fall or if you hit your head (even if there is no bleeding).  Some medicines may interact with Eliquis and might increase your risk of bleeding or clotting while on Eliquis. To help avoid this, consult your healthcare provider or pharmacist prior to using any new prescription or non-prescription medications, including herbals, vitamins, non-steroidal  anti-inflammatory drugs (NSAIDs) and supplements.  This website has more information on Eliquis (apixaban): http://[email or web address].com/eliquis/home  PRECAUTIONS:  If you experience chest pain or shortness of breath - call 911 immediately for transfer to the hospital emergency department.   If you develop a fever greater that 101 F, purulent drainage from wound, increased redness or drainage from wound, foul odor from  the wound/dressing, or calf pain - CONTACT YOUR SURGEON.                                                   FOLLOW-UP APPOINTMENTS:  If you do not already have a post-op appointment, please call the office for an appointment to be seen by your surgeon.  Guidelines for how soon to be seen are listed in your "After Visit Summary", but are typically between 2-3 weeks after surgery.  OTHER INSTRUCTIONS:   Knee Replacement:  Do not place pillow under knee, focus on keeping the knee straight while resting. CPM instructions: 0-90 degrees, 2 hours in the morning, 2 hours in the afternoon, and 2 hours in the evening. Place foam block, curve side up under heel at all times except when in CPM or when walking.  DO NOT modify, tear, cut, or change the foam block in any way.  POST-OPERATIVE OPIOID TAPER INSTRUCTIONS: It is important to wean off of your opioid medication as soon as possible. If you do not need pain medication after your surgery it is ok to stop day one. Opioids include: Codeine, Hydrocodone(Norco, Vicodin), Oxycodone(Percocet, oxycontin) and hydromorphone amongst others.  Long term and even short term use of opiods can cause: Increased pain response Dependence Constipation Depression Respiratory depression And more.  Withdrawal symptoms can include Flu like symptoms Nausea, vomiting And more Techniques to manage these symptoms Hydrate well Eat regular healthy meals Stay active Use relaxation techniques(deep breathing, meditating, yoga) Do Not substitute Alcohol to help  with tapering If you have been on opioids for less than two weeks and do not have pain than it is ok to stop all together.  Plan to wean off of opioids This plan should start within one week post op of your joint replacement. Maintain the same interval or time between taking each dose and first decrease the dose.  Cut the total daily intake of opioids by one tablet each day Next start to increase the time between doses. The last dose that should be eliminated is the evening dose.   MAKE SURE YOU:  Understand these instructions.  Get help right away if you are not doing well or get worse.    Thank you for letting us be a part of your medical care team.  It is a privilege we respect greatly.  We hope these instructions will help you stay on track for a fast and full recovery!

## 2023-03-05 NOTE — Evaluation (Signed)
Physical Therapy Evaluation Patient Details Name: [name] MRN: [id number] DOB: June 15, 1946 Today's Date: 03/05/2023  History of Present Illness  77 yo female presents to therapy s/p L TKA on 03/05/2023 due to failure of conservative measures. 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  Clinical Impression    [name] is a 77 y.o. female POD 0 s/p L TKA. Patient reports mod I with mobility at baseline. Patient is now limited by functional impairments (see PT problem list below) and requires min guard for bed mobility and min guard and cues for transfers. Patient was able to ambulate 25 feet with RW and min guard level of assist. Patient instructed in exercise to facilitate ROM and circulation to manage edema. Patient will benefit from continued skilled PT interventions to address impairments and progress towards PLOF. Acute PT will follow to progress mobility and stair training in preparation for safe discharge home with family assist and OPPT.      Recommendations for follow up therapy are one component of a multi-disciplinary discharge planning process, led by the attending physician.  Recommendations may be updated based on patient status, additional functional criteria and insurance authorization.  Follow Up Recommendations       Assistance Recommended at Discharge Intermittent Supervision/Assistance  Patient can return home with the following  A little help with walking and/or transfers;A little help with bathing/dressing/bathroom;Assistance with cooking/housework;Assist for transportation;Help with stairs or ramp for entrance    Equipment Recommendations Rolling walker (2 wheels)  Recommendations for Other Services       Functional Status Assessment Patient has had a recent decline in their functional status and demonstrates the ability to make significant  improvements in function in a reasonable and predictable amount of time.     Precautions / Restrictions Precautions Precautions: Knee;Fall Restrictions Weight Bearing Restrictions: No      Mobility  Bed Mobility Overal bed mobility: Needs Assistance Bed Mobility: Supine to Sit     Supine to sit: Min guard     General bed mobility comments: HOB elevated and cues    Transfers Overall transfer level: Needs assistance Equipment used: Rolling walker (2 wheels) Transfers: Sit to/from Stand Sit to Stand: Min guard           General transfer comment: cues for proper UE placement    Ambulation/Gait Ambulation/Gait assistance: Min guard Gait Distance (Feet): 25 Feet Assistive device: Rolling walker (2 wheels) Gait Pattern/deviations: Step-to pattern, Antalgic, Trunk flexed Gait velocity: decreased     General Gait Details: limited due to reports of nausea  Stairs            Wheelchair Mobility    Modified Rankin (Stroke Patients Only)       Balance Overall balance assessment: Needs assistance Sitting-balance support: Feet supported Sitting balance-Leahy Scale: Good     Standing balance support: Bilateral upper extremity supported, During functional activity, Reliant on assistive device for balance Standing balance-Leahy Scale: Poor                               Pertinent Vitals/Pain Pain Assessment Pain Assessment: 0-10 Pain Score: 3  Pain Location: L LE/knee Pain Descriptors / Indicators: Aching, Constant, Discomfort, Dull, Guarding Pain Intervention(s): Limited activity within patient's tolerance, Monitored during session, Premedicated before session, Repositioned, Ice applied    Home Living Family/patient  expects to be discharged to:: Private residence Living Arrangements: Alone Available Help at Discharge: Family (daughter to stay with pt at time of d/c) Type of Home: House Home Access: Stairs to enter Entrance Stairs-Rails:  Right Entrance Stairs-Number of Steps: 4   Home Layout: One level Home Equipment: Cane - single point      Prior Function Prior Level of Function : Independent/Modified Independent             Mobility Comments: mod I at [facility] level for mobility tasks, ADLs, self care and IADLs       Hand Dominance        Extremity/Trunk Assessment        Lower Extremity Assessment Lower Extremity Assessment: LLE deficits/detail LLE Deficits / Details: ankle DF/PF 5/5; SLR < 10 degree lag LLE Sensation: WNL    Cervical / Trunk Assessment Cervical / Trunk Assessment:  (wfl)  Communication      Cognition Arousal/Alertness: Awake/alert Behavior During Therapy: WFL for tasks assessed/performed Overall Cognitive Status: Within Functional Limits for tasks assessed                                          General Comments      Exercises Total Joint Exercises Ankle Circles/Pumps: AROM, Both, 20 reps   Assessment/Plan    PT Assessment Patient needs continued PT services  PT Problem List Decreased strength;Decreased range of motion;Decreased activity tolerance;Decreased balance;Decreased mobility;Decreased coordination;Pain       PT Treatment Interventions DME instruction;Gait training;Stair training;Functional mobility training;Therapeutic activities;Therapeutic exercise;Balance training;Neuromuscular re-education;Patient/family education;Modalities    PT Goals (Current goals can be found in the Care Plan section)  Acute Rehab PT Goals Patient Stated Goal: improve mobilty and d/c cane PT Goal Formulation: With patient Time For Goal Achievement: 03/19/23 Potential to Achieve Goals: Good    Frequency 7X/week     Co-evaluation               AM-PAC PT "6 Clicks" Mobility  Outcome Measure Help needed turning from your back to your side while in a flat bed without using bedrails?: A Little Help needed moving from lying on your back to sitting on the  side of a flat bed without using bedrails?: A Little Help needed moving to and from a bed to a chair (including a wheelchair)?: A Little Help needed standing up from a chair using your arms (e.g., wheelchair or bedside chair)?: A Little Help needed to walk in hospital room?: A Little Help needed climbing 3-5 steps with a railing? : A Lot 6 Click Score: 17    End of Session Equipment Utilized During Treatment: Gait belt Activity Tolerance: Treatment limited secondary to medical complications (Comment) (nausea) Patient left: in chair;with call bell/phone within reach;with family/visitor present Nurse Communication: Mobility status PT Visit Diagnosis: Unsteadiness on feet (R26.81);Other abnormalities of gait and mobility (R26.89);Muscle weakness (generalized) (M62.81);Pain Pain - Right/Left: Left Pain - part of body: Knee;Leg    Time: 1610-9604 PT Time Calculation (min) (ACUTE ONLY): 26 min   Charges:   PT Evaluation $PT Eval Low Complexity: 1 Low PT Treatments $Gait Training: 8-22 mins        Johnny Bridge, PT Acute Rehab   Jacqualyn Posey 03/05/2023, 6:44 PM

## 2023-03-05 NOTE — Transfer of Care (Signed)
Immediate Anesthesia Transfer of Care Note  Patient: [name]  Procedure(s) Performed: TOTAL KNEE ARTHROPLASTY (Left: Knee)  Patient Location: PACU  Anesthesia Type:Spinal  Level of Consciousness: awake, alert , and oriented  Airway & Oxygen Therapy: Patient Spontanous Breathing and Patient connected to face mask oxygen  Post-op Assessment: Report given to RN and Post -op Vital signs reviewed and stable  Post vital signs: Reviewed and stable  Last Vitals:  Vitals Value Taken Time  BP 159/84 03/05/23 1300  Temp    Pulse 74 03/05/23 1301  Resp 15 03/05/23 1301  SpO2 90 % 03/05/23 1301  Vitals shown include unvalidated device data.  Last Pain:  Vitals:   03/05/23 0852  TempSrc:   PainSc: 0-No pain         Complications: No notable events documented.

## 2023-03-05 NOTE — Progress Notes (Signed)
Orthopedic Tech Progress Note Patient Details:  [name] [birth year] [id number]  Ortho Devices Type of Ortho Device: Bone foam zero knee Ortho Device/Splint Interventions: Ordered      [place] A Gerilyn Pilgrim 03/05/2023, 1:08 PM

## 2023-03-06 ENCOUNTER — Encounter (HOSPITAL_COMMUNITY): Payer: Self-pay | Admitting: Orthopedic Surgery

## 2023-03-06 DIAGNOSIS — Z86711 Personal history of pulmonary embolism: Secondary | ICD-10-CM | POA: Diagnosis not present

## 2023-03-06 DIAGNOSIS — I509 Heart failure, unspecified: Secondary | ICD-10-CM | POA: Diagnosis not present

## 2023-03-06 DIAGNOSIS — M1712 Unilateral primary osteoarthritis, left knee: Secondary | ICD-10-CM | POA: Diagnosis not present

## 2023-03-06 DIAGNOSIS — I110 Hypertensive heart disease with heart failure: Secondary | ICD-10-CM | POA: Diagnosis not present

## 2023-03-06 DIAGNOSIS — Z8673 Personal history of transient ischemic attack (TIA), and cerebral infarction without residual deficits: Secondary | ICD-10-CM | POA: Diagnosis not present

## 2023-03-06 DIAGNOSIS — Z7982 Long term (current) use of aspirin: Secondary | ICD-10-CM | POA: Diagnosis not present

## 2023-03-06 LAB — BASIC METABOLIC PANEL
Anion gap: 8 (ref 5–15)
BUN: 9 mg/dL (ref 8–23)
CO2: 24 mmol/L (ref 22–32)
Calcium: 8.6 mg/dL — ABNORMAL LOW (ref 8.9–10.3)
Chloride: 105 mmol/L (ref 98–111)
Creatinine, Ser: 0.79 mg/dL (ref 0.44–1.00)
GFR, Estimated: >60 mL/min (ref >60)
Glucose, Bld: 134 mg/dL — ABNORMAL HIGH (ref 70–99)
Potassium: 4.3 mmol/L (ref 3.5–5.1)
Sodium: 137 mmol/L (ref 135–145)

## 2023-03-06 LAB — CBC
HCT: 35.3 % — ABNORMAL LOW (ref 36.0–46.0)
Hemoglobin: 11.4 g/dL — ABNORMAL LOW (ref 12.0–15.0)
MCH: 30.5 pg (ref 26.0–34.0)
MCHC: 32.3 g/dL (ref 30.0–36.0)
MCV: 94.4 fL (ref 80.0–100.0)
Platelets: 251 10*3/uL (ref 150–400)
RBC: 3.74 MIL/uL — ABNORMAL LOW (ref 3.87–5.11)
RDW: 13.3 % (ref 11.5–15.5)
WBC: 13.7 10*3/uL — ABNORMAL HIGH (ref 4.0–10.5)
nRBC: 0 % (ref 0.0–0.2)

## 2023-03-06 MED ORDER — METOCLOPRAMIDE HCL 5 MG/ML IJ SOLN: 10.0000 mg | Freq: Three times a day (TID) | INTRAMUSCULAR | Status: DC | PRN

## 2023-03-06 MED ORDER — HYDROCODONE-ACETAMINOPHEN 5-325 MG PO TABS
1.0000 | ORAL_TABLET | ORAL | Status: DC | PRN
  Administered 2023-03-06: 1 via ORAL
  Administered 2023-03-06 – 2023-03-07 (×2): 2 via ORAL
  Filled 2023-03-06: qty 1
  Filled 2023-03-06: qty 2
  Filled 2023-03-06 (×2): qty 1

## 2023-03-06 MED ORDER — METOCLOPRAMIDE HCL 5 MG/ML IJ SOLN
5.0000 mg | Freq: Three times a day (TID) | INTRAMUSCULAR | Status: DC | PRN
  Administered 2023-03-06: 5 mg via INTRAVENOUS
  Filled 2023-03-06: qty 2

## 2023-03-06 MED ORDER — METOCLOPRAMIDE HCL 5 MG/ML IJ SOLN: 5.0000 mg | Freq: Three times a day (TID) | INTRAMUSCULAR | Status: DC

## 2023-03-06 MED ORDER — METOCLOPRAMIDE HCL 5 MG PO TABS
5.0000 mg | ORAL_TABLET | Freq: Three times a day (TID) | ORAL | Status: DC | PRN
  Filled 2023-03-06: qty 1

## 2023-03-06 NOTE — Progress Notes (Signed)
Patient has been struggling with nausea today. Reglan ordered as alternative to zofran. Concern that maybe the oxycodone is contributing to nausea so switched her to hydrocodone to see if this works better.

## 2023-03-06 NOTE — Progress Notes (Signed)
Physical Therapy Treatment Patient Details Name: [name] MRN: [id number] DOB: 10-Feb-1946 Today's Date: 03/06/2023   History of Present Illness 77 yo female presents to therapy s/p L TKA on 03/05/2023 due to failure of conservative measures. 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    PT Comments    POD # 1 am session General Comments: AxO x 3 very pleasant Lady who lives home alone but plans to have her daughter stay with her. Assisted OOB required increased time.  General bed mobility comments: demonstarted and instructed how to use a belt to self guide LE.  General transfer comment: cues for proper UE placement.  Also assisted with a toilet transfer. General Gait Details: tolerated amb to bathroom then in hallway a total of 45 feet.  MAX c/o fatigue after.   Performed a few TE's followed ICE.  Issued HEP handout.  Will see pt again for stair training.    Recommendations for follow up therapy are one component of a multi-disciplinary discharge planning process, led by the attending physician.  Recommendations may be updated based on patient status, additional functional criteria and insurance authorization.  Follow Up Recommendations       Assistance Recommended at Discharge Intermittent Supervision/Assistance  Patient can return home with the following A little help with walking and/or transfers;A little help with bathing/dressing/bathroom;Assistance with cooking/housework;Assist for transportation;Help with stairs or ramp for entrance   Equipment Recommendations  Rolling walker (2 wheels)    Recommendations for Other Services       Precautions / Restrictions Precautions Precautions: Knee;Fall Precaution Comments: no pillow under knee Restrictions Weight Bearing Restrictions: No     Mobility  Bed Mobility Overal bed mobility: Needs Assistance Bed  Mobility: Supine to Sit     Supine to sit: Min guard     General bed mobility comments: demonstarted and instructed how to use a belt to self guide LE    Transfers Overall transfer level: Needs assistance Equipment used: Rolling walker (2 wheels) Transfers: Sit to/from Stand Sit to Stand: Supervision, Min guard           General transfer comment: cues for proper UE placement.  Also assisted with a toilet transfer.    Ambulation/Gait Ambulation/Gait assistance: Min guard Gait Distance (Feet): 45 Feet Assistive device: Rolling walker (2 wheels) Gait Pattern/deviations: Step-to pattern, Antalgic, Trunk flexed Gait velocity: decreased     General Gait Details:tolerated amb to bathroom then in hallway a total of 45 feet.    Stairs             Wheelchair Mobility    Modified Rankin (Stroke Patients Only)       Balance                                            Cognition Arousal/Alertness: Awake/alert Behavior During Therapy: WFL for tasks assessed/performed Overall Cognitive Status: Within Functional Limits for tasks assessed                                 General Comments: AxO x 3 very pleasant Lady who lives home alone but plans to have her daughter stay with her.        Exercises  10 reps AP 10 reps knee presses    General Comments        Pertinent Vitals/Pain Pain Assessment Pain Assessment: 0-10 Pain Score: 8  Pain Location: L LE/knee Pain Descriptors / Indicators: Constant, Discomfort, Guarding, Operative site guarding Pain Intervention(s): Monitored during session, Premedicated before session, Repositioned, Ice applied    Home Living                          Prior Function            PT Goals (current goals can now be found in the care plan section) Progress towards PT goals: Progressing toward goals    Frequency    7X/week      PT Plan Current plan remains appropriate     Co-evaluation              AM-PAC PT "6 Clicks" Mobility   Outcome Measure  Help needed turning from your back to your side while in a flat bed without using bedrails?: A Little Help needed moving from lying on your back to sitting on the side of a flat bed without using bedrails?: A Little Help needed moving to and from a bed to a chair (including a wheelchair)?: A Little Help needed standing up from a chair using your arms (e.g., wheelchair or bedside chair)?: A Little Help needed to walk in hospital room?: A Little Help needed climbing 3-5 steps with a railing? : A Lot 6 Click Score: 17    End of Session Equipment Utilized During Treatment: Gait belt Activity Tolerance: Treatment limited secondary to medical complications (Comment) Patient left: in chair;with call bell/phone within reach;with family/visitor present Nurse Communication: Mobility status PT Visit Diagnosis: Unsteadiness on feet (R26.81);Other abnormalities of gait and mobility (R26.89);Muscle weakness (generalized) (M62.81);Pain Pain - Right/Left: Left     Time: 0920-1000 PT Time Calculation (min) (ACUTE ONLY): 40 min  Charges:  $Gait Training: 8-22 mins $Therapeutic Exercise: 8-22 mins $Therapeutic Activity: 8-22 mins                     Felecia Shelling  PTA Acute  Rehabilitation Services Office M-F          [phone]

## 2023-03-06 NOTE — Progress Notes (Signed)
     Subjective:  Patient reports pain as mild.  Did well with physical therapy yesterday ambulating 25 feet.  Hopeful for discharge home later today.  Denies distal numbness and tingling.  No concerns.  Objective:   VITALS:   Vitals:   03/05/23 1633 03/05/23 2206 03/06/23 0227 03/06/23 0604  BP: (!) 162/87 134/81 100/72 127/72  Pulse: 80 83 76 74  Resp: 16 17 17 17   Temp: 97.7 F (36.5 C) 98.3 F (36.8 C) 98.1 F (36.7 C) 98.1 F (36.7 C)  TempSrc: Oral Oral Oral Oral  SpO2: 95% 95% 94% 96%  Weight:      Height:        Sensation intact distally Intact pulses distally Dorsiflexion/Plantar flexion intact Incision: dressing C/D/I Compartment soft    Lab Results  Component Value Date   WBC 13.7 (H) 03/06/2023   HGB 11.4 (L) 03/06/2023   HCT 35.3 (L) 03/06/2023   MCV 94.4 03/06/2023   PLT 251 03/06/2023   BMET    Component Value Date/Time   NA 137 03/06/2023 0354   NA 143 11/20/2022 1033   K 4.3 03/06/2023 0354   CL 105 03/06/2023 0354   CO2 24 03/06/2023 0354   GLUCOSE 134 (H) 03/06/2023 0354   BUN 9 03/06/2023 0354   BUN 17 11/20/2022 1033   CREATININE 0.79 03/06/2023 0354   CALCIUM 8.6 (L) 03/06/2023 0354   EGFR 83 11/20/2022 1033   GFRNONAA >60 03/06/2023 0354   Xray: Total knee arthroplasty components in good position no adverse features  Assessment/Plan: 1 Day Post-Op   Principal Problem:   Primary osteoarthritis of left knee  S/p L TKA 03/05/23  Post op recs: WB: WBAT Abx: ancef Imaging: PACU xrays DVT prophylaxis: Eliquis 2.5 mg twice daily x 4 weeks given history of DVT/PE many years ago when she had her right femur fracture Follow up: 2 weeks after surgery for a wound check with Dr. Blanchie Dessert at [facility].  Address: [address], [place], [place] [address]  Office Phone: [phone]   [name] 03/06/2023, 6:56 AM   Weber Cooks, MD  Contact information:   [phone] 7am-5pm epic message  Dr. Blanchie Dessert, or call office for patient follow up: [phone] After hours and holidays please check [email or web address] for group call information for [facility]

## 2023-03-06 NOTE — Progress Notes (Signed)
Physical Therapy Treatment Patient Details Name: [name] MRN: [id number] DOB: Apr 18, 1946 Today's Date: 03/06/2023   History of Present Illness 77 yo female presents to therapy s/p L TKA on 03/05/2023 due to failure of conservative measures. 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    PT Comments    POD # 1 pm session General Comments: AxO x 3 very pleasant Lady who lives home alone but plans to have her daughter stay with her. Having a "bad day" off nausea and vomiting with activity.  Assisted OOB to amb to bathroom.  General transfer comment: cues for proper UE placement.  Also assisted with a toilet transfer when pt began to vomit.  RN called to room. General Gait Details: limited amb distance to and from bathroom only due to nausea/fatigue/effort. Assisted pt back to bed.  Concerns that the OXY is causing her N/V.  RN alerting MD/PA.  Pt has NOT yet met mobility goals to safely manage at home.  Will see in am.    Recommendations for follow up therapy are one component of a multi-disciplinary discharge planning process, led by the attending physician.  Recommendations may be updated based on patient status, additional functional criteria and insurance authorization.  Follow Up Recommendations       Assistance Recommended at Discharge Intermittent Supervision/Assistance  Patient can return home with the following A little help with walking and/or transfers;A little help with bathing/dressing/bathroom;Assistance with cooking/housework;Assist for transportation;Help with stairs or ramp for entrance   Equipment Recommendations  Rolling walker (2 wheels)    Recommendations for Other Services       Precautions / Restrictions Precautions Precautions: Knee;Fall Precaution Comments: no pillow under knee Restrictions Weight Bearing Restrictions: No     Mobility  Bed  Mobility Overal bed mobility: Needs Assistance Bed Mobility: Supine to Sit, Sit to Supine     Supine to sit: Supervision, Min guard Sit to supine: Min guard, Min assist   General bed mobility comments: demonstarted and instructed how to use a belt to self guide LE.  Required increased asisst back to bed.    Transfers Overall transfer level: Needs assistance Equipment used: Rolling walker (2 wheels) Transfers: Sit to/from Stand Sit to Stand: Supervision, Min guard           General transfer comment: cues for proper UE placement.  Also assisted with a toilet transfer when pt began to vomit.  RN called to room.    Ambulation/Gait Ambulation/Gait assistance: Min guard Gait Distance (Feet): 24 Feet Assistive device: Rolling walker (2 wheels) Gait Pattern/deviations: Step-to pattern, Antalgic, Trunk flexed Gait velocity: decreased     General Gait Details: limited amb distance to and from bathroom only due to nausea/fatigue/effort.   Stairs             Wheelchair Mobility    Modified Rankin (Stroke Patients Only)       Balance                                            Cognition Arousal/Alertness: Awake/alert Behavior During Therapy: WFL for tasks assessed/performed Overall Cognitive Status: Within Functional Limits for tasks assessed  General Comments: AxO x 3 very pleasant Lady who lives home alone but plans to have her daughter stay with her.        Exercises      General Comments        Pertinent Vitals/Pain Pain Assessment Pain Assessment: 0-10 Pain Score: 8  Pain Location: L LE/knee Pain Descriptors / Indicators: Constant, Discomfort, Guarding, Operative site guarding Pain Intervention(s): Monitored during session, Premedicated before session, Repositioned, Ice applied    Home Living                          Prior Function            PT Goals (current goals  can now be found in the care plan section) Progress towards PT goals: Progressing toward goals    Frequency    7X/week      PT Plan Current plan remains appropriate    Co-evaluation              AM-PAC PT "6 Clicks" Mobility   Outcome Measure  Help needed turning from your back to your side while in a flat bed without using bedrails?: A Little Help needed moving from lying on your back to sitting on the side of a flat bed without using bedrails?: A Little Help needed moving to and from a bed to a chair (including a wheelchair)?: A Little Help needed standing up from a chair using your arms (e.g., wheelchair or bedside chair)?: A Little Help needed to walk in hospital room?: A Little Help needed climbing 3-5 steps with a railing? : A Lot 6 Click Score: 17    End of Session Equipment Utilized During Treatment: Gait belt Activity Tolerance: Treatment limited secondary to medical complications (Comment) Patient left: in bed;with bed alarm set;with nursing/sitter in room;with call bell/phone within reach;with family/visitor present Nurse Communication: Mobility status PT Visit Diagnosis: Unsteadiness on feet (R26.81);Other abnormalities of gait and mobility (R26.89);Muscle weakness (generalized) (M62.81);Pain Pain - Right/Left: Left     Time: 1535-1600 PT Time Calculation (min) (ACUTE ONLY): 25 min  Charges:  $Gait Training: 8-22 mins $Therapeutic Activity: 8-22 mins                     Felecia Shelling  PTA Acute  Rehabilitation Services Office M-F          [phone]
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
## 2023-03-06 NOTE — Plan of Care (Signed)
  Problem: Activity: Goal: Ability to avoid complications of mobility impairment will improve Outcome: Progressing Goal: Range of joint motion will improve Outcome: Progressing   Problem: Activity: Goal: Risk for activity intolerance will decrease Outcome: Progressing   Problem: Nutrition: Goal: Adequate nutrition will be maintained Outcome: Progressing

## 2023-03-06 NOTE — TOC Transition Note (Signed)
Transition of Care [facility]) - CM/SW Discharge Note  Patient Details  Name: [name] MRN: [id number] Date of Birth: 01-23-1946  Transition of [facility]) CM/SW Contact:  Ewing Schlein, LCSW Phone Number: 03/06/2023, 10:37 AM  Clinical Narrative: Patient is expected to discharge home after working with PT. CSW met with patient to confirm discharge plan and needs. Patient will go home with HHPT, which was prearranged with [facility], and then will transition to OPPT at [facility]. Patient will need a rolling walker, which [employer] delivered to patient's room. TOC signing off.    Final next level of care: Home w Home Health Services Barriers to Discharge: No Barriers Identified  Patient Goals and CMS Choice CMS Medicare.gov Compare Post Acute Care list provided to:: Patient Choice offered to / list presented to : Patient  Discharge Plan and Services Additional resources added to the After Visit Summary for         DME Arranged: Walker rolling DME Agency: [employer] Representative spoke with at DME Agency: Prearranged in orthopedist's office HH Arranged: PT HH Agency: Optometrist spoke with at [facility] Agency: Prearranged in orthopedist's office  Social Determinants of Health (SDOH) Interventions SDOH Screenings   Food Insecurity: No Food Insecurity (03/05/2023)  Housing: Low Risk  (03/05/2023)  Transportation Needs: No Transportation Needs (03/05/2023)  Utilities: Not At Risk (03/05/2023)  Alcohol Screen: Low Risk  (07/14/2022)  Depression (PHQ2-9): Low Risk  (11/20/2022)  Financial Resource Strain: Low Risk  (07/14/2022)  Physical Activity: Sufficiently Active (07/14/2022)  Social Connections: Moderately Integrated (07/14/2022)  Stress: No Stress Concern Present (07/14/2022)  Tobacco Use: Low Risk  (03/05/2023)   Readmission Risk Interventions     No data to display

## 2023-03-07 DIAGNOSIS — Z8673 Personal history of transient ischemic attack (TIA), and cerebral infarction without residual deficits: Secondary | ICD-10-CM | POA: Diagnosis not present

## 2023-03-07 DIAGNOSIS — Z7982 Long term (current) use of aspirin: Secondary | ICD-10-CM | POA: Diagnosis not present

## 2023-03-07 DIAGNOSIS — I509 Heart failure, unspecified: Secondary | ICD-10-CM | POA: Diagnosis not present

## 2023-03-07 DIAGNOSIS — M1712 Unilateral primary osteoarthritis, left knee: Secondary | ICD-10-CM | POA: Diagnosis not present

## 2023-03-07 DIAGNOSIS — Z86711 Personal history of pulmonary embolism: Secondary | ICD-10-CM | POA: Diagnosis not present

## 2023-03-07 DIAGNOSIS — I110 Hypertensive heart disease with heart failure: Secondary | ICD-10-CM | POA: Diagnosis not present

## 2023-03-07 LAB — CBC
HCT: 37.1 % (ref 36.0–46.0)
Hemoglobin: 11.9 g/dL — ABNORMAL LOW (ref 12.0–15.0)
MCH: 30 pg (ref 26.0–34.0)
MCHC: 32.1 g/dL (ref 30.0–36.0)
MCV: 93.5 fL (ref 80.0–100.0)
Platelets: 266 10*3/uL (ref 150–400)
RBC: 3.97 MIL/uL (ref 3.87–5.11)
RDW: 13.3 % (ref 11.5–15.5)
WBC: 13.1 10*3/uL — ABNORMAL HIGH (ref 4.0–10.5)
nRBC: 0 % (ref 0.0–0.2)

## 2023-03-07 MED ORDER — HYDROCODONE-ACETAMINOPHEN 5-325 MG PO TABS: 1.0000 | ORAL_TABLET | ORAL | 0 refills | Status: AC | PRN

## 2023-03-07 NOTE — Progress Notes (Signed)
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    PT Comments    POD # 2 am session Pt reports feeling "better" no nausea.  Assisted OOB to amb to bathroom then in hallway went well.  General stair comments: with Daughter "hands on" asissted using a safety belt.  Pt was able to safely naviagte up/down 3 steps.  Performed twice.  Then returned to room to perform some TE's following HEP handout.  Instructed on proper tech, freq as well as use of ICE.   Addressed all mobility questions, discussed appropriate activity, educated on use of ICE.  Pt ready for D/C to home.   Recommendations for follow up therapy are one component of a multi-disciplinary discharge planning process, led by the attending physician.  Recommendations may be updated based on patient status, additional functional criteria and insurance authorization.  Follow Up Recommendations       Assistance Recommended at Discharge Intermittent Supervision/Assistance  Patient can return home with the following A little help with walking and/or transfers;A little help with bathing/dressing/bathroom;Assistance with cooking/housework;Assist for transportation;Help with stairs or ramp for entrance   Equipment Recommendations  Rolling walker (2 wheels)    Recommendations for Other Services       Precautions / Restrictions Precautions Precautions: Knee;Fall Precaution Comments: no pillow under knee Restrictions Weight Bearing Restrictions: No LLE Weight Bearing: Weight bearing as tolerated     Mobility  Bed Mobility   Bed Mobility: Supine to Sit     Supine to sit:  Supervision, Min guard     General bed mobility comments: demonstarted and instructed how to use a belt to self guide LE.    Transfers Overall transfer level: Needs assistance Equipment used: Rolling walker (2 wheels) Transfers: Sit to/from Stand Sit to Stand: Supervision           General transfer comment: cues for proper UE placement.  Also assisted with a toilet transfer.  Good use of hands to steady self.  Pt was able to perform self peri care in standing with Therapist to staedy balance.    Ambulation/Gait Ambulation/Gait assistance: Supervision Gait Distance (Feet): 45 Feet Assistive device: Rolling walker (2 wheels) Gait Pattern/deviations: Step-to pattern, Antalgic, Trunk flexed Gait velocity: decreased     General Gait Details: tolerated an increased distance with no issues of N/V.   Stairs Stairs: Yes Stairs assistance: Supervision, Min guard Stair Management: Two rails, Step to pattern, Forwards Number of Stairs: 6 General stair comments: with Daughter "hands on" asissted using a safety belt.  Pt was able to safely naviagte up/down 3 steps.   Wheelchair Mobility    Modified Rankin (Stroke Patients Only)       Balance                                            Cognition Arousal/Alertness: Awake/alert Behavior During Therapy: WFL for tasks assessed/performed Overall Cognitive Status: Within Functional Limits for tasks assessed  General Comments: AxO x 3 very pleasant Lady who lives home alone but plans to have her daughter stay with her.        Exercises  Total Knee Replacement TE's following HEP handout 10 reps B LE ankle pumps 05 reps towel squeezes 05 reps knee presses 05 reps heel slides  05 reps SAQ's 05 reps SLR's 05 reps ABD Educated on use of gait belt to assist with TE's Followed by ICE     General Comments        Pertinent Vitals/Pain Pain Assessment Pain  Assessment: 0-10 Pain Score: 5  Pain Location: L LE/knee Pain Descriptors / Indicators: Constant, Discomfort, Guarding, Operative site guarding Pain Intervention(s): Monitored during session, Premedicated before session, Repositioned, Ice applied    Home Living                          Prior Function            PT Goals (current goals can now be found in the care plan section) Progress towards PT goals: Progressing toward goals    Frequency    7X/week      PT Plan Current plan remains appropriate    Co-evaluation              AM-PAC PT "6 Clicks" Mobility   Outcome Measure  Help needed turning from your back to your side while in a flat bed without using bedrails?: A Little Help needed moving from lying on your back to sitting on the side of a flat bed without using bedrails?: A Little Help needed moving to and from a bed to a chair (including a wheelchair)?: A Little Help needed standing up from a chair using your arms (e.g., wheelchair or bedside chair)?: A Little Help needed to walk in hospital room?: A Little Help needed climbing 3-5 steps with a railing? : A Little 6 Click Score: 18    End of Session Equipment Utilized During Treatment: Gait belt Activity Tolerance: Patient tolerated treatment well Patient left: in chair;with call bell/phone within reach;with family/visitor present Nurse Communication: Mobility status PT Visit Diagnosis: Unsteadiness on feet (R26.81);Other abnormalities of gait and mobility (R26.89);Muscle weakness (generalized) (M62.81);Pain Pain - Right/Left: Left Pain - part of body: Knee;Leg     Time: 1027-2536 PT Time Calculation (min) (ACUTE ONLY): 41 min  Charges:  $Gait Training: 8-22 mins $Therapeutic Exercise: 8-22 mins $Therapeutic Activity: 8-22 mins                     {Savion Washam  PTA Acute  [employer] M-F          [phone]

## 2023-03-07 NOTE — Progress Notes (Addendum)
     Subjective:  Patient reports pain as mild.  Did well with physical therapy yesterday ambulating 45 feet.  Had issues with nausea yesterday -- oxycodone switched to hydrocodone, and zofran switched to reglan.  Nausea doing better.  Hopeful for discharge home later today.  Denies distal numbness and tingling.  No other concerns.  Objective:   VITALS:   Vitals:   03/06/23 1444 03/06/23 1743 03/06/23 2251 03/07/23 0555  BP: 135/82 138/72 (!) 165/76 (!) 142/82  Pulse: 67 71 71 77  Resp: 18 18 17 18   Temp: 97.8 F (36.6 C) 98 F (36.7 C) 98.4 F (36.9 C) 98.2 F (36.8 C)  TempSrc: Oral  Oral Oral  SpO2: 94% 91% 97% 92%  Weight:      Height:        Sensation intact distally Intact pulses distally Dorsiflexion/Plantar flexion intact Incision: dressing C/D/I Compartment soft Wiggles toes appropriately.   Lab Results  Component Value Date   WBC 13.1 (H) 03/07/2023   HGB 11.9 (L) 03/07/2023   HCT 37.1 03/07/2023   MCV 93.5 03/07/2023   PLT 266 03/07/2023   BMET    Component Value Date/Time   NA 137 03/06/2023 0354   NA 143 11/20/2022 1033   K 4.3 03/06/2023 0354   CL 105 03/06/2023 0354   CO2 24 03/06/2023 0354   GLUCOSE 134 (H) 03/06/2023 0354   BUN 9 03/06/2023 0354   BUN 17 11/20/2022 1033   CREATININE 0.79 03/06/2023 0354   CALCIUM 8.6 (L) 03/06/2023 0354   EGFR 83 11/20/2022 1033   GFRNONAA >60 03/06/2023 0354   Xray: Total knee arthroplasty components in good position no adverse features  Assessment/Plan:  S/p L TKA 03/05/23 2 Days Post-Op   Principal Problem:   Primary osteoarthritis of left knee    Post op recs: WB: WBAT Abx: ancef Imaging: PACU xrays DVT prophylaxis: Eliquis 2.5 mg twice daily x 4 weeks given history of DVT/PE many years ago when she had her right femur fracture Follow up: 2 weeks after surgery for a wound check with Dr. Blanchie Dessert at [facility].  Address: [address], [place], [place] [address]   Office Phone: [phone]   Cecil Cobbs 03/07/2023, 8:01 AM   Weber Cooks, MD  Contact information:   [phone] 7am-5pm epic message Dr. Blanchie Dessert, or call office for patient follow up: [phone] After hours and holidays please check [email or web address] for group call information for [facility]

## 2023-03-07 NOTE — Progress Notes (Signed)
Patient discharged to home w/ family. Given all belongings, instructions, equipment. Verbalized understanding of instructions. Escorted to pov via w/c. 

## 2023-03-08 ENCOUNTER — Telehealth: Payer: Self-pay

## 2023-03-08 DIAGNOSIS — E781 Pure hyperglyceridemia: Secondary | ICD-10-CM | POA: Diagnosis not present

## 2023-03-08 DIAGNOSIS — G4730 Sleep apnea, unspecified: Secondary | ICD-10-CM | POA: Diagnosis not present

## 2023-03-08 DIAGNOSIS — H269 Unspecified cataract: Secondary | ICD-10-CM | POA: Diagnosis not present

## 2023-03-08 DIAGNOSIS — Z471 Aftercare following joint replacement surgery: Secondary | ICD-10-CM | POA: Diagnosis not present

## 2023-03-08 DIAGNOSIS — M654 Radial styloid tenosynovitis [de Quervain]: Secondary | ICD-10-CM | POA: Diagnosis not present

## 2023-03-08 DIAGNOSIS — H524 Presbyopia: Secondary | ICD-10-CM | POA: Diagnosis not present

## 2023-03-08 DIAGNOSIS — K219 Gastro-esophageal reflux disease without esophagitis: Secondary | ICD-10-CM | POA: Diagnosis not present

## 2023-03-08 DIAGNOSIS — F339 Major depressive disorder, recurrent, unspecified: Secondary | ICD-10-CM | POA: Diagnosis not present

## 2023-03-08 DIAGNOSIS — I509 Heart failure, unspecified: Secondary | ICD-10-CM | POA: Diagnosis not present

## 2023-03-08 DIAGNOSIS — G8929 Other chronic pain: Secondary | ICD-10-CM | POA: Diagnosis not present

## 2023-03-08 DIAGNOSIS — F419 Anxiety disorder, unspecified: Secondary | ICD-10-CM | POA: Diagnosis not present

## 2023-03-08 DIAGNOSIS — Z791 Long term (current) use of non-steroidal anti-inflammatories (NSAID): Secondary | ICD-10-CM | POA: Diagnosis not present

## 2023-03-08 DIAGNOSIS — M5414 Radiculopathy, thoracic region: Secondary | ICD-10-CM | POA: Diagnosis not present

## 2023-03-08 DIAGNOSIS — I110 Hypertensive heart disease with heart failure: Secondary | ICD-10-CM | POA: Diagnosis not present

## 2023-03-08 DIAGNOSIS — K5900 Constipation, unspecified: Secondary | ICD-10-CM | POA: Diagnosis not present

## 2023-03-08 DIAGNOSIS — M5417 Radiculopathy, lumbosacral region: Secondary | ICD-10-CM | POA: Diagnosis not present

## 2023-03-08 DIAGNOSIS — I50812 Chronic right heart failure: Secondary | ICD-10-CM | POA: Diagnosis not present

## 2023-03-08 DIAGNOSIS — Z96652 Presence of left artificial knee joint: Secondary | ICD-10-CM | POA: Diagnosis not present

## 2023-03-08 DIAGNOSIS — E669 Obesity, unspecified: Secondary | ICD-10-CM | POA: Diagnosis not present

## 2023-03-08 DIAGNOSIS — Z6836 Body mass index (BMI) 36.0-36.9, adult: Secondary | ICD-10-CM | POA: Diagnosis not present

## 2023-03-08 DIAGNOSIS — Z7901 Long term (current) use of anticoagulants: Secondary | ICD-10-CM | POA: Diagnosis not present

## 2023-03-08 DIAGNOSIS — G629 Polyneuropathy, unspecified: Secondary | ICD-10-CM | POA: Diagnosis not present

## 2023-03-08 DIAGNOSIS — N393 Stress incontinence (female) (male): Secondary | ICD-10-CM | POA: Diagnosis not present

## 2023-03-08 DIAGNOSIS — Z8701 Personal history of pneumonia (recurrent): Secondary | ICD-10-CM | POA: Diagnosis not present

## 2023-03-08 DIAGNOSIS — G43909 Migraine, unspecified, not intractable, without status migrainosus: Secondary | ICD-10-CM | POA: Diagnosis not present

## 2023-03-08 NOTE — Transitions of Care (Post Inpatient/ED Visit) (Signed)
03/08/2023  Name: [name] MRN: [id number] DOB: 06-14-1946  Today's TOC FU Call Status: Today's TOC FU Call Status:: Successful TOC FU Call Competed TOC FU Call Complete Date: 03/08/23  Transition Care Management Follow-up Telephone Call Date of Discharge: 03/07/23 Discharge Facility: [facility]) Type of Discharge: Inpatient Admission Primary Inpatient Discharge Diagnosis:: a/p left TKR How have you been since you were released from the hospital?: Better Any questions or concerns?: No  Items Reviewed: Did you receive and understand the discharge instructions provided?: Yes Medications obtained,verified, and reconciled?: No Medications Not Reviewed Reasons:: Other: [name] said her mother has all of her medications and they did not have any questions about the meds and she did not need to review the med list.) Any new allergies since your discharge?: No Dietary orders reviewed?: Yes Type of Diet Ordered:: heart healthy, low sodium Do you have support at home?: Yes People in Home: child(ren), adult Name of Support/Comfort Primary Source: The patient lives alone but her daughter is staying with her for 2 weeks.  Medications Reviewed Today: Medications Reviewed Today     Reviewed by Florene Route, CRNA (Certified Registered Nurse Anesthetist) on 03/05/23 at [id number]  Med List Status: Complete   Medication Order Taking? Sig Documenting Provider Last Dose Status Informant  acetaminophen (TYLENOL) 500 MG tablet [id number] Yes Take 1,000 mg by mouth 2 (two) times daily. [provider] Past Week Active Self  amLODipine (NORVASC) 10 MG tablet [id number] Yes TAKE 1 TABLET (10 MG TOTAL) BY MOUTH DAILY. Quintella Reichert, MD 03/05/2023 0630 Active   aspirin EC 81 MG tablet [id number] Yes Take 81 mg by mouth daily. Swallow whole. [provider] 02/19/2023 Active Self  atorvastatin (LIPITOR) 20 MG tablet [id number] Yes TAKE 1 TABLET (20 MG TOTAL) BY MOUTH DAILY.  Patient  taking differently: Take 20 mg by mouth at bedtime.   Quintella Reichert, MD 03/04/2023 Active Self  Calcium Carb-Cholecalciferol (CALCIUM 600-D PO) [id number] Yes Take 600 mg by mouth daily. [provider] 02/19/2023 Active Self  Cholecalciferol (VITAMIN D) 50 MCG (2000 UT) CAPS [id number] Yes Take 2,000 Units by mouth daily. [provider] 02/19/2023 Active Self  diazepam (VALIUM) 5 MG tablet [id number] No Take 5 mg by mouth every 6 (six) hours as needed (CT scan/.Flying). [provider] Unknown Active Self  DULoxetine (CYMBALTA) 30 MG capsule [id number] No TAKE 1 CAPSULE (30 MG TOTAL) BY MOUTH DAILY. Georganna Skeans, MD Unknown Active Self  furosemide (LASIX) 20 MG tablet [id number] Yes TAKE 1 TABLET (20 MG TOTAL) BY MOUTH DAILY. Quintella Reichert, MD 03/04/2023 Active Self  gabapentin (NEURONTIN) 300 MG capsule [id number] Yes Take 1 capsule (300 mg total) by mouth 3 (three) times daily.  Patient taking differently: Take 300-600 mg by mouth See admin instructions. Take 300 mg in the morning and 600 mg at betime   Georganna Skeans, MD 03/05/2023 0530 Active Self  losartan (COZAAR) 100 MG tablet [id number] Yes TAKE 1 TABLET (100 MG TOTAL) BY MOUTH DAILY. Quintella Reichert, MD 03/04/2023 Active Self  Melatonin 10 MG TABS [id number] Yes Take 10 mg by mouth at bedtime. [provider] 03/04/2023 Active Self  Multiple Vitamin (MULTIVITAMIN) capsule [id number] Yes Take 1 capsule by mouth daily. Woman 50+ [provider] 02/19/2023 Active Self  naproxen sodium (ALEVE) 220 MG tablet [id number] Yes Take 220 mg by mouth daily as needed (Migraine). [provider] Past Month Active Self  omeprazole (PRILOSEC) 20 MG capsule [id number] Yes TAKE 1 CAPSULE  BY MOUTH DAILY. OFFICE VISIT REQUIRED PRIOR TO ANY FURTHER REFILLS Georganna Skeans, MD 03/04/2023 Active Self  ondansetron (ZOFRAN ODT) 4 MG disintegrating tablet [id number] Yes Take 1 tablet (4 mg total) by mouth every 8 (eight) hours as  needed. Georganna Skeans, MD 03/04/2023 Active Self  OVER THE COUNTER MEDICATION [id number] Yes Take 240 mg by mouth at bedtime. CBD gummy 960 mg [provider] Past Week Active Self  SUMAtriptan (IMITREX) 100 MG tablet [id number] Yes Take 1 tablet (100 mg total) by mouth every 2 (two) hours as needed for migraine. May repeat in 2 hours if headache persists or recurs. Georganna Skeans, MD Past Month Active Self  tiZANidine (ZANAFLEX) 4 MG tablet [id number] Yes Take 1 tablet (4 mg total) by mouth every 6 (six) hours as needed for muscle spasms. Georganna Skeans, MD Past Month Active Self  topiramate (TOPAMAX) 50 MG tablet [id number] Yes TAKE 1 TABLET BY MOUTH EVERY MORNING AND 2 TABLETS AT BEDTIME Georganna Skeans, MD 03/05/2023 0530 Active Self  vitamin E 180 MG (400 UNITS) capsule [id number] Yes Take 400 Units by mouth daily. [provider] 02/19/2023 Active Self            Home Care and Equipment/Supplies: Were Home Health Services Ordered?: Yes Name of Home Health Agency:: [facility] Has Agency set up a time to come to your home?: Yes First Home Health Visit Date: 03/08/23 Any new equipment or medical supplies ordered?: Yes Name of Medical supply agency?: [employer] Were you able to get the equipment/medical supplies?: Yes Do you have any questions related to the use of the equipment/supplies?: No  Functional Questionnaire: Do you need assistance with bathing/showering or dressing?: Yes ([name] is assisting as needed) Do you need assistance with meal preparation?: Yes ([name] is preparing her meals) Do you need assistance with eating?: No Do you have difficulty maintaining continence: No Do you need assistance with getting out of bed/getting out of a chair/moving?: Yes (She is ambulating with her RW and [name] assists as needed) Do you have difficulty managing or taking your medications?: Yes  Follow up appointments reviewed: PCP Follow-up appointment confirmed?: Yes Date of PCP  follow-up appointment?: 05/21/23 Follow-up Provider: [facility] Follow-up appointment confirmed?: Yes Date of Specialist follow-up appointment?: 03/20/23 Follow-Up Specialty Provider:: orthopedic surgeon Do you need transportation to your follow-up appointment?: No Do you understand care options if your condition(s) worsen?: Yes-patient verbalized understanding    SIGNATURE Robyne Peers, RN

## 2023-03-10 DIAGNOSIS — I509 Heart failure, unspecified: Secondary | ICD-10-CM | POA: Diagnosis not present

## 2023-03-10 DIAGNOSIS — I110 Hypertensive heart disease with heart failure: Secondary | ICD-10-CM | POA: Diagnosis not present

## 2023-03-10 DIAGNOSIS — G629 Polyneuropathy, unspecified: Secondary | ICD-10-CM | POA: Diagnosis not present

## 2023-03-10 DIAGNOSIS — I50812 Chronic right heart failure: Secondary | ICD-10-CM | POA: Diagnosis not present

## 2023-03-10 DIAGNOSIS — Z471 Aftercare following joint replacement surgery: Secondary | ICD-10-CM | POA: Diagnosis not present

## 2023-03-10 DIAGNOSIS — Z96652 Presence of left artificial knee joint: Secondary | ICD-10-CM | POA: Diagnosis not present

## 2023-03-12 DIAGNOSIS — I110 Hypertensive heart disease with heart failure: Secondary | ICD-10-CM | POA: Diagnosis not present

## 2023-03-12 DIAGNOSIS — G629 Polyneuropathy, unspecified: Secondary | ICD-10-CM | POA: Diagnosis not present

## 2023-03-12 DIAGNOSIS — I509 Heart failure, unspecified: Secondary | ICD-10-CM | POA: Diagnosis not present

## 2023-03-12 DIAGNOSIS — Z96652 Presence of left artificial knee joint: Secondary | ICD-10-CM | POA: Diagnosis not present

## 2023-03-12 DIAGNOSIS — Z471 Aftercare following joint replacement surgery: Secondary | ICD-10-CM | POA: Diagnosis not present

## 2023-03-12 DIAGNOSIS — I50812 Chronic right heart failure: Secondary | ICD-10-CM | POA: Diagnosis not present

## 2023-03-14 DIAGNOSIS — Z96652 Presence of left artificial knee joint: Secondary | ICD-10-CM | POA: Diagnosis not present

## 2023-03-14 DIAGNOSIS — I110 Hypertensive heart disease with heart failure: Secondary | ICD-10-CM | POA: Diagnosis not present

## 2023-03-14 DIAGNOSIS — I509 Heart failure, unspecified: Secondary | ICD-10-CM | POA: Diagnosis not present

## 2023-03-14 DIAGNOSIS — G629 Polyneuropathy, unspecified: Secondary | ICD-10-CM | POA: Diagnosis not present

## 2023-03-14 DIAGNOSIS — I50812 Chronic right heart failure: Secondary | ICD-10-CM | POA: Diagnosis not present

## 2023-03-14 DIAGNOSIS — Z471 Aftercare following joint replacement surgery: Secondary | ICD-10-CM | POA: Diagnosis not present

## 2023-03-16 DIAGNOSIS — I509 Heart failure, unspecified: Secondary | ICD-10-CM | POA: Diagnosis not present

## 2023-03-16 DIAGNOSIS — Z471 Aftercare following joint replacement surgery: Secondary | ICD-10-CM | POA: Diagnosis not present

## 2023-03-16 DIAGNOSIS — Z96652 Presence of left artificial knee joint: Secondary | ICD-10-CM | POA: Diagnosis not present

## 2023-03-16 DIAGNOSIS — G629 Polyneuropathy, unspecified: Secondary | ICD-10-CM | POA: Diagnosis not present

## 2023-03-16 DIAGNOSIS — I50812 Chronic right heart failure: Secondary | ICD-10-CM | POA: Diagnosis not present

## 2023-03-16 DIAGNOSIS — I110 Hypertensive heart disease with heart failure: Secondary | ICD-10-CM | POA: Diagnosis not present

## 2023-03-19 DIAGNOSIS — G629 Polyneuropathy, unspecified: Secondary | ICD-10-CM | POA: Diagnosis not present

## 2023-03-19 DIAGNOSIS — I509 Heart failure, unspecified: Secondary | ICD-10-CM | POA: Diagnosis not present

## 2023-03-19 DIAGNOSIS — Z96652 Presence of left artificial knee joint: Secondary | ICD-10-CM | POA: Diagnosis not present

## 2023-03-19 DIAGNOSIS — Z471 Aftercare following joint replacement surgery: Secondary | ICD-10-CM | POA: Diagnosis not present

## 2023-03-19 DIAGNOSIS — I110 Hypertensive heart disease with heart failure: Secondary | ICD-10-CM | POA: Diagnosis not present

## 2023-03-19 DIAGNOSIS — I50812 Chronic right heart failure: Secondary | ICD-10-CM | POA: Diagnosis not present

## 2023-03-20 DIAGNOSIS — M25662 Stiffness of left knee, not elsewhere classified: Secondary | ICD-10-CM | POA: Diagnosis not present

## 2023-03-20 DIAGNOSIS — M1712 Unilateral primary osteoarthritis, left knee: Secondary | ICD-10-CM | POA: Diagnosis not present

## 2023-03-20 DIAGNOSIS — M6281 Muscle weakness (generalized): Secondary | ICD-10-CM | POA: Diagnosis not present

## 2023-03-21 ENCOUNTER — Other Ambulatory Visit: Payer: Self-pay | Admitting: Neurology

## 2023-03-22 DIAGNOSIS — M25662 Stiffness of left knee, not elsewhere classified: Secondary | ICD-10-CM | POA: Diagnosis not present

## 2023-03-22 DIAGNOSIS — M1712 Unilateral primary osteoarthritis, left knee: Secondary | ICD-10-CM | POA: Diagnosis not present

## 2023-03-22 DIAGNOSIS — M6281 Muscle weakness (generalized): Secondary | ICD-10-CM | POA: Diagnosis not present

## 2023-03-26 DIAGNOSIS — M6281 Muscle weakness (generalized): Secondary | ICD-10-CM | POA: Diagnosis not present

## 2023-03-26 DIAGNOSIS — M1712 Unilateral primary osteoarthritis, left knee: Secondary | ICD-10-CM | POA: Diagnosis not present

## 2023-03-26 DIAGNOSIS — M25662 Stiffness of left knee, not elsewhere classified: Secondary | ICD-10-CM | POA: Diagnosis not present

## 2023-03-27 DIAGNOSIS — M1712 Unilateral primary osteoarthritis, left knee: Secondary | ICD-10-CM | POA: Diagnosis not present

## 2023-03-28 DIAGNOSIS — M1712 Unilateral primary osteoarthritis, left knee: Secondary | ICD-10-CM | POA: Diagnosis not present

## 2023-03-28 DIAGNOSIS — M6281 Muscle weakness (generalized): Secondary | ICD-10-CM | POA: Diagnosis not present

## 2023-03-28 DIAGNOSIS — M25662 Stiffness of left knee, not elsewhere classified: Secondary | ICD-10-CM | POA: Diagnosis not present

## 2023-04-02 DIAGNOSIS — M1712 Unilateral primary osteoarthritis, left knee: Secondary | ICD-10-CM | POA: Diagnosis not present

## 2023-04-02 DIAGNOSIS — M6281 Muscle weakness (generalized): Secondary | ICD-10-CM | POA: Diagnosis not present

## 2023-04-02 DIAGNOSIS — M25662 Stiffness of left knee, not elsewhere classified: Secondary | ICD-10-CM | POA: Diagnosis not present

## 2023-04-04 DIAGNOSIS — M25662 Stiffness of left knee, not elsewhere classified: Secondary | ICD-10-CM | POA: Diagnosis not present

## 2023-04-04 DIAGNOSIS — M1712 Unilateral primary osteoarthritis, left knee: Secondary | ICD-10-CM | POA: Diagnosis not present

## 2023-04-04 DIAGNOSIS — M6281 Muscle weakness (generalized): Secondary | ICD-10-CM | POA: Diagnosis not present

## 2023-04-10 DIAGNOSIS — M1712 Unilateral primary osteoarthritis, left knee: Secondary | ICD-10-CM | POA: Diagnosis not present

## 2023-04-10 DIAGNOSIS — M25662 Stiffness of left knee, not elsewhere classified: Secondary | ICD-10-CM | POA: Diagnosis not present

## 2023-04-10 DIAGNOSIS — M6281 Muscle weakness (generalized): Secondary | ICD-10-CM | POA: Diagnosis not present

## 2023-04-17 DIAGNOSIS — M1712 Unilateral primary osteoarthritis, left knee: Secondary | ICD-10-CM | POA: Diagnosis not present

## 2023-04-18 DIAGNOSIS — M25662 Stiffness of left knee, not elsewhere classified: Secondary | ICD-10-CM | POA: Diagnosis not present

## 2023-04-18 DIAGNOSIS — M1712 Unilateral primary osteoarthritis, left knee: Secondary | ICD-10-CM | POA: Diagnosis not present

## 2023-04-18 DIAGNOSIS — M6281 Muscle weakness (generalized): Secondary | ICD-10-CM | POA: Diagnosis not present

## 2023-04-20 DIAGNOSIS — M25662 Stiffness of left knee, not elsewhere classified: Secondary | ICD-10-CM | POA: Diagnosis not present

## 2023-04-20 DIAGNOSIS — M1712 Unilateral primary osteoarthritis, left knee: Secondary | ICD-10-CM | POA: Diagnosis not present

## 2023-04-20 DIAGNOSIS — M6281 Muscle weakness (generalized): Secondary | ICD-10-CM | POA: Diagnosis not present

## 2023-04-23 DIAGNOSIS — M25662 Stiffness of left knee, not elsewhere classified: Secondary | ICD-10-CM | POA: Diagnosis not present

## 2023-04-23 DIAGNOSIS — M6281 Muscle weakness (generalized): Secondary | ICD-10-CM | POA: Diagnosis not present

## 2023-04-23 DIAGNOSIS — M1712 Unilateral primary osteoarthritis, left knee: Secondary | ICD-10-CM | POA: Diagnosis not present

## 2023-04-25 DIAGNOSIS — M1712 Unilateral primary osteoarthritis, left knee: Secondary | ICD-10-CM | POA: Diagnosis not present

## 2023-04-25 DIAGNOSIS — M6281 Muscle weakness (generalized): Secondary | ICD-10-CM | POA: Diagnosis not present

## 2023-04-25 DIAGNOSIS — M25662 Stiffness of left knee, not elsewhere classified: Secondary | ICD-10-CM | POA: Diagnosis not present

## 2023-05-21 ENCOUNTER — Encounter: Payer: Self-pay | Admitting: Family Medicine

## 2023-05-21 ENCOUNTER — Ambulatory Visit: Payer: Medicare Other | Admitting: Family Medicine

## 2023-05-21 VITALS — BP 123/85 | HR 62 | Temp 97.9°F | Ht 68.0 in | Wt 217.6 lb

## 2023-05-21 DIAGNOSIS — G8929 Other chronic pain: Secondary | ICD-10-CM | POA: Diagnosis not present

## 2023-05-21 DIAGNOSIS — F339 Major depressive disorder, recurrent, unspecified: Secondary | ICD-10-CM

## 2023-05-21 DIAGNOSIS — M25562 Pain in left knee: Secondary | ICD-10-CM | POA: Diagnosis not present

## 2023-05-21 DIAGNOSIS — I10 Essential (primary) hypertension: Secondary | ICD-10-CM

## 2023-05-21 DIAGNOSIS — M25561 Pain in right knee: Secondary | ICD-10-CM

## 2023-05-21 DIAGNOSIS — K219 Gastro-esophageal reflux disease without esophagitis: Secondary | ICD-10-CM | POA: Diagnosis not present

## 2023-05-21 MED ORDER — OMEPRAZOLE 20 MG PO CPDR
DELAYED_RELEASE_CAPSULE | ORAL | 0 refills | Status: DC
Start: 2023-05-21 — End: 2023-08-16

## 2023-05-21 MED ORDER — TOPIRAMATE 50 MG PO TABS: ORAL_TABLET | ORAL | 1 refills | Status: DC
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
## 2023-05-21 NOTE — Progress Notes (Signed)
Pt states nothing to speak about.   She needs refills on Omeprazole, and Topiramate, I went and refilled both of those. -AS

## 2023-05-29 DIAGNOSIS — M1712 Unilateral primary osteoarthritis, left knee: Secondary | ICD-10-CM | POA: Diagnosis not present

## 2023-06-01 ENCOUNTER — Ambulatory Visit
Admission: RE | Admit: 2023-06-01 | Discharge: 2023-06-01 | Disposition: A | Discharge status: Home / Self Care | Payer: Medicare Other | Source: Ambulatory Visit | Attending: Family Medicine | Admitting: Family Medicine

## 2023-06-01 DIAGNOSIS — Z13820 Encounter for screening for osteoporosis: Secondary | ICD-10-CM

## 2023-06-01 DIAGNOSIS — Z90722 Acquired absence of ovaries, bilateral: Secondary | ICD-10-CM | POA: Diagnosis not present

## 2023-06-01 DIAGNOSIS — N958 Other specified menopausal and perimenopausal disorders: Secondary | ICD-10-CM | POA: Diagnosis not present

## 2023-06-01 DIAGNOSIS — M8588 Other specified disorders of bone density and structure, other site: Secondary | ICD-10-CM | POA: Diagnosis not present

## 2023-06-01 DIAGNOSIS — E349 Endocrine disorder, unspecified: Secondary | ICD-10-CM | POA: Diagnosis not present

## 2023-06-20 DIAGNOSIS — Z961 Presence of intraocular lens: Secondary | ICD-10-CM | POA: Diagnosis not present

## 2023-06-20 DIAGNOSIS — H04123 Dry eye syndrome of bilateral lacrimal glands: Secondary | ICD-10-CM | POA: Diagnosis not present

## 2023-07-19 DIAGNOSIS — Z23 Encounter for immunization: Secondary | ICD-10-CM | POA: Diagnosis not present

## 2023-08-16 ENCOUNTER — Other Ambulatory Visit: Payer: Self-pay | Admitting: Family Medicine

## 2023-08-16 DIAGNOSIS — K219 Gastro-esophageal reflux disease without esophagitis: Secondary | ICD-10-CM
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
## 2023-09-10 ENCOUNTER — Telehealth: Payer: Self-pay | Admitting: Cardiology

## 2023-09-10 ENCOUNTER — Ambulatory Visit: Payer: Medicare Other | Admitting: Nurse Practitioner

## 2023-09-10 MED ORDER — AMLODIPINE BESYLATE 10 MG PO TABS: 10.0000 mg | ORAL_TABLET | Freq: Every day | ORAL | 0 refills | Status: DC

## 2023-09-10 NOTE — Telephone Encounter (Signed)
*  STAT* If patient is at the pharmacy, call can be transferred to refill team.   1. Which medications need to be refilled? (please list name of each medication and dose if known)   amLODipine (NORVASC) 10 MG tablet   2. Would you like to learn more about the convenience, safety, & potential cost savings by using the [facility]?   3. Are you open to using the [facility] (Type [facility]. ).  4. Which pharmacy/location (including street and city if local pharmacy) is medication to be sent to?  [id number] - [place], [place] - [address]   5. Do they need a 30 day or 90 day supply?    90 day  Patient stated she is completely out of this medication.  Patient has appointment scheduled on 10/02/23.

## 2023-09-10 NOTE — Telephone Encounter (Signed)
Pt's medication was sent to pt's pharmacy as requested. Confirmation received.  °

## 2023-10-02 ENCOUNTER — Encounter: Payer: Self-pay | Admitting: Cardiology

## 2023-10-02 ENCOUNTER — Ambulatory Visit: Payer: Medicare Other | Attending: Cardiology | Admitting: Cardiology

## 2023-10-02 VITALS — BP 164/90 | HR 64 | Ht 68.0 in | Wt 213.2 lb

## 2023-10-02 DIAGNOSIS — I951 Orthostatic hypotension: Secondary | ICD-10-CM | POA: Insufficient documentation

## 2023-10-02 DIAGNOSIS — I493 Ventricular premature depolarization: Secondary | ICD-10-CM | POA: Diagnosis not present

## 2023-10-02 DIAGNOSIS — R001 Bradycardia, unspecified: Secondary | ICD-10-CM | POA: Diagnosis not present

## 2023-10-02 DIAGNOSIS — R600 Localized edema: Secondary | ICD-10-CM | POA: Insufficient documentation

## 2023-10-02 DIAGNOSIS — I10 Essential (primary) hypertension: Secondary | ICD-10-CM | POA: Insufficient documentation
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
## 2023-10-02 NOTE — Patient Instructions (Addendum)
 Medication Instructions:  Your physician recommends that you continue on your current medications as directed. Please refer to the Current Medication list given to you today.  *If you need a refill on your cardiac medications before your next appointment, please call your pharmacy*   Lab Work: None.  If you have labs (blood work) drawn today and your tests are completely normal, you will receive your results only by: MyChart Message (if you have MyChart) OR A paper copy in the mail If you have any lab test that is abnormal or we need to change your treatment, we will call you to review the results.   Testing/Procedures: None.   Follow-Up:  Your next appointment:   1 year(s)  Provider:   Wilbert Bihari, MD     Other Instructions Please check your blood pressure twice a day for one week. Check your blood pressure once around lunch time and once around dinner time. Write down each reading with the date, time and heart rate (if possible). Call our office and submit your readings after one week-all our operators can take down your blood pressure log. You can also submit over MyChart.

## 2023-10-09 ENCOUNTER — Telehealth: Payer: Self-pay | Admitting: Cardiology

## 2023-10-09 DIAGNOSIS — I10 Essential (primary) hypertension: Secondary | ICD-10-CM

## 2023-10-09 DIAGNOSIS — Z79899 Other long term (current) drug therapy: Secondary | ICD-10-CM

## 2023-10-09 NOTE — Telephone Encounter (Signed)
 BP Readings  160/88 70 173/83 66 176/81 72 174/84 69 170/83 69 182/88 71 180/86 66 168/82 68 158/86 60 185/110 68 173/93 61 185/97 70 160/89 72

## 2023-10-16 MED ORDER — IRBESARTAN 300 MG PO TABS: 300.0000 mg | ORAL_TABLET | Freq: Every day | ORAL | 3 refills | Status: DC

## 2023-10-16 NOTE — Telephone Encounter (Signed)
Call to patient to advise that Dr. Mayford Knife reviewed medications and advises she stop losartan and start irbesartan 300 mg. Patient verbalizes understanding, script sent to pharmacy of choice and lab orders placed. Patient agrees to also provide 1 week of BP readings after she starts irbesartan.

## 2023-10-23 ENCOUNTER — Telehealth: Payer: Self-pay | Admitting: Cardiology

## 2023-10-23 DIAGNOSIS — Z79899 Other long term (current) drug therapy: Secondary | ICD-10-CM

## 2023-10-23 DIAGNOSIS — I10 Essential (primary) hypertension: Secondary | ICD-10-CM

## 2023-10-23 NOTE — Telephone Encounter (Signed)
Pt dropped of bp readings. Will be in providers box by eod.

## 2023-10-24 LAB — BASIC METABOLIC PANEL
BUN/Creatinine Ratio: 17 (ref 12–28)
BUN: 15 mg/dL (ref 8–27)
CO2: 23 mmol/L (ref 20–29)
Calcium: 10.3 mg/dL (ref 8.7–10.3)
Chloride: 105 mmol/L (ref 96–106)
Creatinine, Ser: 0.89 mg/dL (ref 0.57–1.00)
Glucose: 105 mg/dL — ABNORMAL HIGH (ref 70–99)
Potassium: 4.3 mmol/L (ref 3.5–5.2)
Sodium: 143 mmol/L (ref 134–144)
eGFR: 67 mL/min/{1.73_m2} (ref >59)

## 2023-10-25 ENCOUNTER — Other Ambulatory Visit: Payer: Self-pay | Admitting: Family Medicine

## 2023-10-25 MED ORDER — SPIRONOLACTONE 25 MG PO TABS: 12.5000 mg | ORAL_TABLET | Freq: Every day | ORAL | 3 refills | Status: DC

## 2023-10-25 NOTE — Telephone Encounter (Signed)
Spoke with patient who states she was told to d/c losartan on 10/16/23 and was also started on irbesartan 300 mg  at that time.

## 2023-10-25 NOTE — Addendum Note (Signed)
Addended by: Luellen Pucker on: 10/25/2023 04:52 PM   Modules accepted: Orders

## 2023-10-25 NOTE — Telephone Encounter (Signed)
Call to patient to explain that Dr. Mayford Knife would like to have her add 12.5 mg of spironolactone daily and continue her irbesartan. Patient agrees to this plan and to check her blood pressure twice daily at lunch and dinner for a week. Patient states she will drop off her readings when she comes in for her BMET on February 6 or 7.

## 2023-10-25 NOTE — Telephone Encounter (Signed)
Patient turned in the following BP log:  10/17/23: 134/67 HR 71  10/18/23: 144/80 HR 64  10/19/23: 151/73 HR 67  10/20/23: 149/85 HR 66  10/21/23: 146/79 HR 70  10/22/23: 148/86 HR 65  10/23/23: 140/82 HR 68

## 2023-11-01 ENCOUNTER — Other Ambulatory Visit: Payer: Self-pay

## 2023-11-01 DIAGNOSIS — Z79899 Other long term (current) drug therapy: Secondary | ICD-10-CM | POA: Diagnosis not present

## 2023-11-01 DIAGNOSIS — I10 Essential (primary) hypertension: Secondary | ICD-10-CM

## 2023-11-02 LAB — BASIC METABOLIC PANEL
BUN/Creatinine Ratio: 18 (ref 12–28)
BUN: 18 mg/dL (ref 8–27)
CO2: 25 mmol/L (ref 20–29)
Calcium: 9.9 mg/dL (ref 8.7–10.3)
Chloride: 106 mmol/L (ref 96–106)
Creatinine, Ser: 0.98 mg/dL (ref 0.57–1.00)
Glucose: 95 mg/dL (ref 70–99)
Potassium: 4.8 mmol/L (ref 3.5–5.2)
Sodium: 142 mmol/L (ref 134–144)
eGFR: 60 mL/min/{1.73_m2} (ref >59)

## 2023-11-09 ENCOUNTER — Telehealth: Payer: Self-pay

## 2023-11-09 DIAGNOSIS — Z79899 Other long term (current) drug therapy: Secondary | ICD-10-CM

## 2023-11-09 DIAGNOSIS — I10 Essential (primary) hypertension: Secondary | ICD-10-CM

## 2023-11-09 NOTE — Telephone Encounter (Signed)
Patient dropped off the following BP log:  10/26/23: 139/69 HR 68  10/27/23: 133/72 HR 67  10/28/23: 153/84 HR 61  10/29/23: 142/80 HR 68  10/30/23: 143/85 HR 72  10/31/23: 152/82 HR 63  11/01/23: 1226/77 HR 69

## 2023-11-11 ENCOUNTER — Other Ambulatory Visit: Payer: Self-pay | Admitting: Family Medicine

## 2023-11-12 ENCOUNTER — Other Ambulatory Visit: Payer: Self-pay

## 2023-11-12 ENCOUNTER — Other Ambulatory Visit: Payer: Self-pay | Admitting: Family Medicine

## 2023-11-12 DIAGNOSIS — I10 Essential (primary) hypertension: Secondary | ICD-10-CM

## 2023-11-12 DIAGNOSIS — Z79899 Other long term (current) drug therapy: Secondary | ICD-10-CM

## 2023-11-12 DIAGNOSIS — K219 Gastro-esophageal reflux disease without esophagitis: Secondary | ICD-10-CM

## 2023-11-12 MED ORDER — SPIRONOLACTONE 25 MG PO TABS: 25.0000 mg | ORAL_TABLET | Freq: Every day | ORAL | 3 refills | Status: DC

## 2023-11-12 NOTE — Telephone Encounter (Signed)
 Patient verbalizes understanding to increase spironolactone to 25mg  daily and check BMET In [redacted] week along with BP check daily at lunch for a week and call with results.

## 2023-11-12 NOTE — Addendum Note (Signed)
 Addended by: Luellen Pucker on: 11/12/2023 05:53 PM   Modules accepted: Orders

## 2023-11-20 ENCOUNTER — Telehealth: Payer: Self-pay | Admitting: Cardiology

## 2023-11-20 LAB — BASIC METABOLIC PANEL
BUN/Creatinine Ratio: 18 (ref 12–28)
BUN: 16 mg/dL (ref 8–27)
CO2: 22 mmol/L (ref 20–29)
Calcium: 9.8 mg/dL (ref 8.7–10.3)
Chloride: 106 mmol/L (ref 96–106)
Creatinine, Ser: 0.89 mg/dL (ref 0.57–1.00)
Glucose: 94 mg/dL (ref 70–99)
Potassium: 4.9 mmol/L (ref 3.5–5.2)
Sodium: 141 mmol/L (ref 134–144)
eGFR: 67 mL/min/{1.73_m2} (ref >59)

## 2023-11-20 NOTE — Telephone Encounter (Signed)
 Patient dropped off BP reading. 11-20-2023

## 2023-11-21 ENCOUNTER — Ambulatory Visit: Payer: Medicare Other | Admitting: Family Medicine

## 2023-11-25 ENCOUNTER — Other Ambulatory Visit: Payer: Self-pay | Admitting: Family Medicine

## 2023-11-28 ENCOUNTER — Ambulatory Visit: Payer: Medicare Other | Admitting: Family Medicine

## 2023-11-28 ENCOUNTER — Encounter: Payer: Self-pay | Admitting: Family Medicine

## 2023-11-28 VITALS — BP 121/77 | HR 61 | Temp 97.9°F | Resp 16 | Ht 68.0 in | Wt 218.4 lb

## 2023-11-28 DIAGNOSIS — I10 Essential (primary) hypertension: Secondary | ICD-10-CM

## 2023-11-28 DIAGNOSIS — K219 Gastro-esophageal reflux disease without esophagitis: Secondary | ICD-10-CM | POA: Diagnosis not present

## 2023-11-28 DIAGNOSIS — M2550 Pain in unspecified joint: Secondary | ICD-10-CM

## 2023-11-28 MED ORDER — SUMATRIPTAN SUCCINATE 100 MG PO TABS: 100.0000 mg | ORAL_TABLET | ORAL | 5 refills | Status: DC | PRN

## 2023-11-28 MED ORDER — TIZANIDINE HCL 4 MG PO TABS: 4.0000 mg | ORAL_TABLET | Freq: Three times a day (TID) | ORAL | 0 refills | Status: DC

## 2023-11-28 MED ORDER — TRIAMCINOLONE ACETONIDE 40 MG/ML IJ SUSP
40.0000 mg | Freq: Once | INTRAMUSCULAR | Status: AC
  Administered 2023-11-28: 40 mg via INTRAMUSCULAR

## 2023-11-28 MED ORDER — LIDOCAINE 5 % EX PTCH: 1.0000 | MEDICATED_PATCH | CUTANEOUS | 5 refills | Status: AC
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
## 2023-11-29 ENCOUNTER — Encounter: Payer: Self-pay | Admitting: Family Medicine

## 2023-12-19 ENCOUNTER — Other Ambulatory Visit: Payer: Self-pay | Admitting: Cardiology

## 2023-12-19 DIAGNOSIS — I10 Essential (primary) hypertension: Secondary | ICD-10-CM

## 2023-12-19 DIAGNOSIS — Z79899 Other long term (current) drug therapy: Secondary | ICD-10-CM

## 2023-12-19 MED ORDER — SPIRONOLACTONE 25 MG PO TABS: 25.0000 mg | ORAL_TABLET | Freq: Every day | ORAL | 2 refills | Status: DC

## 2023-12-20 ENCOUNTER — Ambulatory Visit: Payer: Medicare Other

## 2023-12-20 DIAGNOSIS — Z0000 Encounter for general adult medical examination without abnormal findings: Secondary | ICD-10-CM

## 2023-12-20 NOTE — Patient Instructions (Signed)
 [name] , Thank you for taking time to come for your Medicare Wellness Visit. I appreciate your ongoing commitment to your health goals. Please review the following plan we discussed and let me know if I can assist you in the future.   Referrals/Orders/Follow-Ups/Clinician Recommendations: none  This is a list of the screening recommended for you and due dates:  Health Maintenance  Topic Date Due   Medicare Annual Wellness Visit  12/19/2024   DTaP/Tdap/Td vaccine (4 - Td or Tdap) 06/14/2025   Pneumonia Vaccine  Completed   Flu Shot  Completed   DEXA scan (bone density measurement)  Completed   Hepatitis C Screening  Completed   HPV Vaccine  Aged Out   Colon Cancer Screening  Discontinued   COVID-19 Vaccine  Discontinued   Zoster (Shingles) Vaccine  Discontinued    Advanced directives: (In Chart) A copy of your advanced directives are scanned into your chart should your provider ever need it.  Next Medicare Annual Wellness Visit scheduled for next year: Yes  insert Preventive Care attachment Insert FALL PREVENTION attachment if needed
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
## 2023-12-21 ENCOUNTER — Other Ambulatory Visit: Payer: Self-pay | Admitting: Family Medicine

## 2023-12-21 NOTE — Telephone Encounter (Signed)
 Requested medication (s) are due for refill today - yes  Requested medication (s) are on the active medication list -yes  Future visit scheduled -yes  Last refill: 11/28/23 #90  Notes to clinic: non delegated Rx, request 90 day Rx  Requested Prescriptions  Pending Prescriptions Disp Refills   tiZANidine (ZANAFLEX) 4 MG tablet [Pharmacy Med Name: TIZANIDINE HCL 4 MG TABLET] 270 tablet 1    Sig: TAKE 1 TABLET BY MOUTH 3 TIMES DAILY.     Not Delegated - Cardiovascular:  Alpha-2 Agonists - tizanidine Failed - 12/21/2023 12:38 PM      Failed - This refill cannot be delegated      Passed - Valid encounter within last 6 months    Recent Outpatient Visits           3 weeks ago Essential hypertension   [place] [facility], MD   7 months ago Essential hypertension   [place] [facility], MD   11 months ago Preop examination   [place] [facility], MD   1 year ago Annual physical exam   [place] [facility], MD   1 year ago Preop examination   [place] [facility], MD                 Requested Prescriptions  Pending Prescriptions Disp Refills   tiZANidine (ZANAFLEX) 4 MG tablet [Pharmacy Med Name: TIZANIDINE HCL 4 MG TABLET] 270 tablet 1    Sig: TAKE 1 TABLET BY MOUTH 3 TIMES DAILY.     Not Delegated - Cardiovascular:  Alpha-2 Agonists - tizanidine Failed - 12/21/2023 12:38 PM      Failed - This refill cannot be delegated      Passed - Valid encounter within last 6 months    Recent Outpatient Visits           3 weeks ago Essential hypertension   [place] [facility], MD   7 months ago Essential hypertension   [place] [facility], MD   11 months ago Preop examination   [place] [facility], MD   1 year ago Annual physical exam   [place] [facility], MD   1 year ago Preop examination   [place] [facility], MD

## 2023-12-26 ENCOUNTER — Other Ambulatory Visit: Payer: Self-pay | Admitting: Cardiology

## 2024-01-10 ENCOUNTER — Other Ambulatory Visit: Payer: Self-pay | Admitting: Family Medicine

## 2024-02-06 ENCOUNTER — Other Ambulatory Visit: Payer: Self-pay | Admitting: Family Medicine

## 2024-02-08 ENCOUNTER — Other Ambulatory Visit: Payer: Self-pay | Admitting: Family Medicine

## 2024-02-08 DIAGNOSIS — K219 Gastro-esophageal reflux disease without esophagitis: Secondary | ICD-10-CM

## 2024-02-11 NOTE — Telephone Encounter (Signed)
 Patient is calling for update on medication refill request for gabapentin  300MG . Request was sent over on 5/14. Patient states she took her last pill today.

## 2024-03-10 ENCOUNTER — Other Ambulatory Visit: Payer: Self-pay | Admitting: Family Medicine

## 2024-03-13 ENCOUNTER — Ambulatory Visit: Payer: Self-pay

## 2024-03-13 ENCOUNTER — Ambulatory Visit (INDEPENDENT_AMBULATORY_CARE_PROVIDER_SITE_OTHER): Admitting: Family Medicine

## 2024-03-13 VITALS — BP 119/79 | HR 64 | Wt 219.6 lb

## 2024-03-13 DIAGNOSIS — M25562 Pain in left knee: Secondary | ICD-10-CM

## 2024-03-13 DIAGNOSIS — K219 Gastro-esophageal reflux disease without esophagitis: Secondary | ICD-10-CM | POA: Diagnosis not present

## 2024-03-13 DIAGNOSIS — M25561 Pain in right knee: Secondary | ICD-10-CM | POA: Diagnosis not present

## 2024-03-13 DIAGNOSIS — G8929 Other chronic pain: Secondary | ICD-10-CM

## 2024-03-13 DIAGNOSIS — I10 Essential (primary) hypertension: Secondary | ICD-10-CM | POA: Diagnosis not present

## 2024-03-13 DIAGNOSIS — Z79899 Other long term (current) drug therapy: Secondary | ICD-10-CM

## 2024-03-13 MED ORDER — TIZANIDINE HCL 4 MG PO TABS: 4.0000 mg | ORAL_TABLET | Freq: Three times a day (TID) | ORAL | 1 refills | Status: DC

## 2024-03-13 MED ORDER — GABAPENTIN 300 MG PO CAPS: ORAL_CAPSULE | ORAL | 1 refills | Status: DC

## 2024-03-13 MED ORDER — DULOXETINE HCL 30 MG PO CPEP: 30.0000 mg | ORAL_CAPSULE | Freq: Every day | ORAL | 1 refills | Status: AC

## 2024-03-13 MED ORDER — TOPIRAMATE 50 MG PO TABS: ORAL_TABLET | ORAL | 1 refills | Status: DC

## 2024-03-13 MED ORDER — SUMATRIPTAN SUCCINATE 100 MG PO TABS: 100.0000 mg | ORAL_TABLET | ORAL | 5 refills | Status: AC | PRN

## 2024-03-13 MED ORDER — ATORVASTATIN CALCIUM 20 MG PO TABS: 20.0000 mg | ORAL_TABLET | Freq: Every day | ORAL | 2 refills | Status: AC

## 2024-03-13 MED ORDER — ONDANSETRON 4 MG PO TBDP: 4.0000 mg | ORAL_TABLET | Freq: Three times a day (TID) | ORAL | 6 refills | Status: AC | PRN

## 2024-03-13 MED ORDER — AMLODIPINE BESYLATE 10 MG PO TABS: 10.0000 mg | ORAL_TABLET | Freq: Every day | ORAL | 0 refills | Status: DC

## 2024-03-13 MED ORDER — OMEPRAZOLE 20 MG PO CPDR: 20.0000 mg | DELAYED_RELEASE_CAPSULE | Freq: Every day | ORAL | 1 refills | Status: DC

## 2024-03-13 MED ORDER — TRIAMCINOLONE ACETONIDE 40 MG/ML IJ SUSP
40.0000 mg | Freq: Once | INTRAMUSCULAR | Status: AC
Start: 2024-03-13 — End: 2024-03-13
  Administered 2024-03-13: 40 mg via INTRAMUSCULAR

## 2024-03-13 MED ORDER — SPIRONOLACTONE 25 MG PO TABS
25.0000 mg | ORAL_TABLET | Freq: Every day | ORAL | 2 refills | Status: AC
Start: 2024-03-13 — End: 2024-06-11

## 2024-03-13 MED ORDER — IRBESARTAN 300 MG PO TABS: 300.0000 mg | ORAL_TABLET | Freq: Every day | ORAL | 3 refills | Status: AC

## 2024-03-13 MED ORDER — FUROSEMIDE 20 MG PO TABS: 20.0000 mg | ORAL_TABLET | Freq: Every day | ORAL | 2 refills | Status: AC

## 2024-03-13 NOTE — Progress Notes (Signed)
Patient given triamcinolone Acetonide 40 mg
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
## 2024-03-14 ENCOUNTER — Encounter: Payer: Self-pay | Admitting: Family Medicine

## 2024-03-22 ENCOUNTER — Other Ambulatory Visit: Payer: Self-pay | Admitting: Cardiology

## 2024-05-30 ENCOUNTER — Ambulatory Visit (INDEPENDENT_AMBULATORY_CARE_PROVIDER_SITE_OTHER): Admitting: Family Medicine

## 2024-05-30 ENCOUNTER — Encounter: Payer: Self-pay | Admitting: Family Medicine

## 2024-05-30 VITALS — BP 122/79 | HR 60 | Ht 68.0 in | Wt 213.6 lb

## 2024-05-30 DIAGNOSIS — I10 Essential (primary) hypertension: Secondary | ICD-10-CM

## 2024-05-30 DIAGNOSIS — M25561 Pain in right knee: Secondary | ICD-10-CM

## 2024-05-30 DIAGNOSIS — M25562 Pain in left knee: Secondary | ICD-10-CM | POA: Diagnosis not present

## 2024-05-30 DIAGNOSIS — G8929 Other chronic pain: Secondary | ICD-10-CM | POA: Diagnosis not present

## 2024-05-30 DIAGNOSIS — F40240 Claustrophobia: Secondary | ICD-10-CM | POA: Diagnosis not present

## 2024-05-30 MED ORDER — TRIAMCINOLONE ACETONIDE 40 MG/ML IJ SUSP: 40.0000 mg | Freq: Once | INTRAMUSCULAR | Status: AC

## 2024-05-30 MED ORDER — MELOXICAM 15 MG PO TABS: 15.0000 mg | ORAL_TABLET | Freq: Every day | ORAL | 2 refills | Status: DC

## 2024-05-30 MED ORDER — DIAZEPAM 10 MG PO TABS: ORAL_TABLET | ORAL | 0 refills | Status: AC
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
## 2024-06-03 DIAGNOSIS — Z23 Encounter for immunization: Secondary | ICD-10-CM | POA: Diagnosis not present

## 2024-06-07 ENCOUNTER — Other Ambulatory Visit: Payer: Self-pay | Admitting: Family Medicine

## 2024-07-06 DIAGNOSIS — Z23 Encounter for immunization: Secondary | ICD-10-CM | POA: Diagnosis not present

## 2024-08-23 ENCOUNTER — Other Ambulatory Visit: Payer: Self-pay | Admitting: Family Medicine

## 2024-09-03 ENCOUNTER — Other Ambulatory Visit: Payer: Self-pay | Admitting: Family Medicine

## 2024-09-04 NOTE — Telephone Encounter (Signed)
 Requested Prescriptions  Pending Prescriptions Disp Refills   gabapentin  (NEURONTIN ) 300 MG capsule [Pharmacy Med Name: GABAPENTIN  300 MG CAPSULE] 270 capsule 0    Sig: TAKE 300 MG IN THE MORNING AND 600 MG AT BEDTIME     Neurology: Anticonvulsants - gabapentin  Passed - 09/04/2024  3:37 PM      Passed - Cr in normal range and within 360 days    Creatinine, Ser  Date Value Ref Range Status  11/20/2023 0.89 0.57 - 1.00 mg/dL Final         Passed - Completed PHQ-2 or PHQ-9 in the last 360 days      Passed - Valid encounter within last 12 months    Recent Outpatient Visits           3 months ago Essential hypertension   [place] [facility], MD   5 months ago Benign essential hypertension   [place] [facility], MD   9 months ago Essential hypertension   [place] [facility], MD   1 year ago Essential hypertension   [place] [facility], MD   1 year ago Preop examination   [place] [facility] Rag[place], MD               amLODipine  (NORVASC ) 10 MG tablet [Pharmacy Med Name: AMLODIPINE  BESYLATE 10 MG TAB] 90 tablet 0    Sig: TAKE 1 TABLET BY MOUTH EVERY DAY     Cardiovascular: Calcium  Channel Blockers 2 Passed - 09/04/2024  3:37 PM      Passed - Last BP in normal range    BP Readings from Last 1 Encounters:  05/30/24 122/79         Passed - Last Heart Rate in normal range    Pulse Readings from Last 1 Encounters:  05/30/24 60         Passed - Valid encounter within last 6 months    Recent Outpatient Visits           3 months ago Essential hypertension   [place] [facility], MD   5 months ago Benign essential hypertension   [place] [facility], MD   9 months ago Essential hypertension   [place] [facility], MD   1 year ago Essential hypertension   [place] [facility], MD   1 year ago Preop examination   [place] [facility], MD

## 2024-09-05 ENCOUNTER — Encounter: Payer: Self-pay | Admitting: Cardiology

## 2024-09-22 ENCOUNTER — Telehealth: Payer: Self-pay

## 2024-09-22 NOTE — Telephone Encounter (Signed)
 Patient has been informed yto go to urgent care.         Copied from CRM #[id number]. Topic: Clinical - Medical Advice >> Sep 22, 2024  2:23 PM Yolanda T wrote: Reason for CRM: patient thinks she has bronchitis or pneumonia as she is coughing up cloudy mucus and has a lot chest congestion. No appts until 1/9. Please f/u with patient as she is asking for medication if she can't be seen.   ----------------------------------------------------------------------- From previous Reason for Contact - Scheduling: Patient/patient representative is calling to schedule an appointment. Refer to attachments for appointment information.

## 2024-09-25 ENCOUNTER — Other Ambulatory Visit: Payer: Self-pay | Admitting: Family Medicine

## 2024-09-26 NOTE — Telephone Encounter (Signed)
 Complete

## 2024-10-22 ENCOUNTER — Other Ambulatory Visit: Payer: Self-pay | Admitting: Family Medicine

## 2024-10-22 DIAGNOSIS — K219 Gastro-esophageal reflux disease without esophagitis: Secondary | ICD-10-CM

## 2024-10-28 ENCOUNTER — Other Ambulatory Visit: Payer: Self-pay | Admitting: Family Medicine

## 2024-10-29 NOTE — Telephone Encounter (Signed)
 Requested by interface surescripts. Future visit 11/27/24.  Requested Prescriptions  Pending Prescriptions Disp Refills   topiramate  (TOPAMAX ) 50 MG tablet [Pharmacy Med Name: TOPIRAMATE  50 MG TABLET] 270 tablet 1    Sig: TAKE 1 TABLET BY MOUTH EVERY MORNING AND 2 TABLETS AT BEDTIME     Neurology: Anticonvulsants - topiramate  & zonisamide Failed - 10/29/2024  3:43 PM      Failed - ALT in normal range and within 360 days    ALT  Date Value Ref Range Status  02/21/2023 13 0 - 44 U/L Final         Failed - AST in normal range and within 360 days    AST  Date Value Ref Range Status  02/21/2023 17 15 - 41 U/L Final         Passed - Cr in normal range and within 360 days    Creatinine, Ser  Date Value Ref Range Status  11/20/2023 0.89 0.57 - 1.00 mg/dL Final         Passed - CO2 in normal range and within 360 days    CO2  Date Value Ref Range Status  11/20/2023 22 20 - 29 mmol/L Final   Bicarbonate  Date Value Ref Range Status  12/26/2007 21.7  Final         Passed - Completed PHQ-2 or PHQ-9 in the last 360 days      Passed - Valid encounter within last 12 months    Recent Outpatient Visits           5 months ago Essential hypertension   [place] [facility], MD   7 months ago Benign essential hypertension   [place] [facility], MD   11 months ago Essential hypertension   [place] [facility], MD   1 year ago Essential hypertension   [place] [facility], MD   1 year ago Preop examination   [place] [facility], MD

## 2024-11-27 ENCOUNTER — Ambulatory Visit: Admitting: Family Medicine

## 2025-01-01 ENCOUNTER — Ambulatory Visit
